# Patient Record
Sex: Female | Born: 1935 | Race: White | Hispanic: No | Marital: Married | Smoking: Never smoker
Health system: Southern US, Community
[De-identification: ages and names within clinical notes are randomized; demographics above are authoritative.]

## PROBLEM LIST (undated history)

## (undated) DIAGNOSIS — M1990 Unspecified osteoarthritis, unspecified site: Secondary | ICD-10-CM

## (undated) HISTORY — PX: CARPAL TUNNEL RELEASE: SHX101

## (undated) HISTORY — PX: REPLACEMENT TOTAL KNEE: SUR1224

---

## 1998-03-09 ENCOUNTER — Ambulatory Visit (HOSPITAL_COMMUNITY): Admission: RE | Admit: 1998-03-09 | Discharge: 1998-03-09 | Payer: Self-pay | Admitting: Obstetrics and Gynecology

## 1998-12-23 ENCOUNTER — Ambulatory Visit (HOSPITAL_COMMUNITY): Admission: RE | Admit: 1998-12-23 | Discharge: 1998-12-23 | Payer: Self-pay | Admitting: Internal Medicine

## 1998-12-24 ENCOUNTER — Encounter: Payer: Self-pay | Admitting: Internal Medicine

## 1999-02-08 ENCOUNTER — Encounter: Payer: Self-pay | Admitting: Internal Medicine

## 1999-02-08 ENCOUNTER — Ambulatory Visit (HOSPITAL_COMMUNITY): Admission: RE | Admit: 1999-02-08 | Discharge: 1999-02-08 | Payer: Self-pay | Admitting: Internal Medicine

## 1999-03-28 ENCOUNTER — Encounter: Payer: Self-pay | Admitting: Internal Medicine

## 1999-03-28 ENCOUNTER — Ambulatory Visit (HOSPITAL_COMMUNITY): Admission: RE | Admit: 1999-03-28 | Discharge: 1999-03-28 | Payer: Self-pay | Admitting: Internal Medicine

## 1999-04-06 ENCOUNTER — Ambulatory Visit (HOSPITAL_COMMUNITY): Admission: RE | Admit: 1999-04-06 | Discharge: 1999-04-06 | Payer: Self-pay | Admitting: Gastroenterology

## 1999-05-15 ENCOUNTER — Other Ambulatory Visit: Admission: RE | Admit: 1999-05-15 | Discharge: 1999-05-15 | Payer: Self-pay | Admitting: Obstetrics and Gynecology

## 1999-05-31 ENCOUNTER — Encounter: Payer: Self-pay | Admitting: Obstetrics and Gynecology

## 1999-05-31 ENCOUNTER — Ambulatory Visit (HOSPITAL_COMMUNITY): Admission: RE | Admit: 1999-05-31 | Discharge: 1999-05-31 | Payer: Self-pay | Admitting: Obstetrics and Gynecology

## 1999-07-20 ENCOUNTER — Ambulatory Visit (HOSPITAL_COMMUNITY): Admission: RE | Admit: 1999-07-20 | Discharge: 1999-07-20 | Payer: Self-pay | Admitting: Orthopaedic Surgery

## 1999-12-13 ENCOUNTER — Ambulatory Visit (HOSPITAL_COMMUNITY): Admission: RE | Admit: 1999-12-13 | Discharge: 1999-12-13 | Payer: Self-pay | Admitting: Gastroenterology

## 1999-12-21 ENCOUNTER — Ambulatory Visit (HOSPITAL_COMMUNITY): Admission: RE | Admit: 1999-12-21 | Discharge: 1999-12-21 | Payer: Self-pay | Admitting: Gastroenterology

## 1999-12-21 ENCOUNTER — Encounter: Payer: Self-pay | Admitting: Gastroenterology

## 2000-01-15 ENCOUNTER — Encounter: Payer: Self-pay | Admitting: Gastroenterology

## 2000-01-15 ENCOUNTER — Ambulatory Visit (HOSPITAL_COMMUNITY): Admission: RE | Admit: 2000-01-15 | Discharge: 2000-01-15 | Payer: Self-pay | Admitting: Gastroenterology

## 2000-02-20 ENCOUNTER — Encounter: Payer: Self-pay | Admitting: Gastroenterology

## 2000-02-20 ENCOUNTER — Ambulatory Visit (HOSPITAL_COMMUNITY): Admission: RE | Admit: 2000-02-20 | Discharge: 2000-02-20 | Payer: Self-pay | Admitting: Gastroenterology

## 2000-03-28 ENCOUNTER — Ambulatory Visit (HOSPITAL_COMMUNITY): Admission: RE | Admit: 2000-03-28 | Discharge: 2000-03-28 | Payer: Self-pay | Admitting: Internal Medicine

## 2000-03-28 ENCOUNTER — Encounter: Payer: Self-pay | Admitting: Internal Medicine

## 2000-04-09 ENCOUNTER — Encounter: Payer: Self-pay | Admitting: Otolaryngology

## 2000-04-09 ENCOUNTER — Ambulatory Visit (HOSPITAL_COMMUNITY): Admission: RE | Admit: 2000-04-09 | Discharge: 2000-04-09 | Payer: Self-pay | Admitting: Otolaryngology

## 2000-05-27 ENCOUNTER — Inpatient Hospital Stay (HOSPITAL_COMMUNITY): Admission: RE | Admit: 2000-05-27 | Discharge: 2000-05-28 | Payer: Self-pay | Admitting: Orthopaedic Surgery

## 2000-06-15 ENCOUNTER — Emergency Department (HOSPITAL_COMMUNITY): Admission: EM | Admit: 2000-06-15 | Discharge: 2000-06-15 | Payer: Self-pay | Admitting: Emergency Medicine

## 2001-03-21 ENCOUNTER — Other Ambulatory Visit: Admission: RE | Admit: 2001-03-21 | Discharge: 2001-03-21 | Payer: Self-pay | Admitting: Obstetrics and Gynecology

## 2001-03-31 ENCOUNTER — Ambulatory Visit (HOSPITAL_COMMUNITY): Admission: RE | Admit: 2001-03-31 | Discharge: 2001-03-31 | Payer: Self-pay | Admitting: Obstetrics and Gynecology

## 2001-03-31 ENCOUNTER — Encounter: Payer: Self-pay | Admitting: Internal Medicine

## 2001-05-27 ENCOUNTER — Encounter: Admission: RE | Admit: 2001-05-27 | Discharge: 2001-05-27 | Payer: Self-pay | Admitting: Orthopaedic Surgery

## 2001-05-28 ENCOUNTER — Ambulatory Visit (HOSPITAL_BASED_OUTPATIENT_CLINIC_OR_DEPARTMENT_OTHER): Admission: RE | Admit: 2001-05-28 | Discharge: 2001-05-29 | Payer: Self-pay | Admitting: Orthopaedic Surgery

## 2001-06-24 ENCOUNTER — Encounter: Admission: RE | Admit: 2001-06-24 | Discharge: 2001-09-22 | Payer: Self-pay | Admitting: Orthopaedic Surgery

## 2001-08-01 ENCOUNTER — Ambulatory Visit (HOSPITAL_BASED_OUTPATIENT_CLINIC_OR_DEPARTMENT_OTHER): Admission: RE | Admit: 2001-08-01 | Discharge: 2001-08-02 | Payer: Self-pay | Admitting: Orthopaedic Surgery

## 2002-01-22 ENCOUNTER — Encounter: Admission: RE | Admit: 2002-01-22 | Discharge: 2002-01-22 | Payer: Self-pay | Admitting: Orthopaedic Surgery

## 2002-02-11 ENCOUNTER — Encounter: Admission: RE | Admit: 2002-02-11 | Discharge: 2002-02-11 | Payer: Self-pay | Admitting: Orthopaedic Surgery

## 2002-04-03 ENCOUNTER — Encounter: Payer: Self-pay | Admitting: Obstetrics and Gynecology

## 2002-04-03 ENCOUNTER — Ambulatory Visit (HOSPITAL_COMMUNITY): Admission: RE | Admit: 2002-04-03 | Discharge: 2002-04-03 | Payer: Self-pay | Admitting: Obstetrics and Gynecology

## 2002-08-07 ENCOUNTER — Encounter: Admission: RE | Admit: 2002-08-07 | Discharge: 2002-08-07 | Payer: Self-pay | Admitting: Orthopaedic Surgery

## 2002-08-14 ENCOUNTER — Inpatient Hospital Stay (HOSPITAL_COMMUNITY): Admission: RE | Admit: 2002-08-14 | Discharge: 2002-08-17 | Payer: Self-pay | Admitting: Orthopaedic Surgery

## 2002-12-21 ENCOUNTER — Inpatient Hospital Stay (HOSPITAL_COMMUNITY): Admission: RE | Admit: 2002-12-21 | Discharge: 2002-12-28 | Payer: Self-pay | Admitting: Orthopaedic Surgery

## 2002-12-25 ENCOUNTER — Encounter: Payer: Self-pay | Admitting: Orthopedic Surgery

## 2002-12-28 ENCOUNTER — Inpatient Hospital Stay
Admission: RE | Admit: 2002-12-28 | Discharge: 2003-01-01 | Payer: Self-pay | Admitting: Physical Medicine & Rehabilitation

## 2003-04-22 ENCOUNTER — Ambulatory Visit (HOSPITAL_COMMUNITY): Admission: RE | Admit: 2003-04-22 | Discharge: 2003-04-22 | Payer: Self-pay | Admitting: Obstetrics and Gynecology

## 2003-04-22 ENCOUNTER — Encounter: Payer: Self-pay | Admitting: Obstetrics and Gynecology

## 2003-06-04 ENCOUNTER — Other Ambulatory Visit: Admission: RE | Admit: 2003-06-04 | Discharge: 2003-06-04 | Payer: Self-pay | Admitting: Obstetrics and Gynecology

## 2003-07-05 ENCOUNTER — Encounter: Admission: RE | Admit: 2003-07-05 | Discharge: 2003-07-05 | Payer: Self-pay | Admitting: Orthopaedic Surgery

## 2003-08-02 ENCOUNTER — Ambulatory Visit (HOSPITAL_COMMUNITY): Admission: RE | Admit: 2003-08-02 | Discharge: 2003-08-02 | Payer: Self-pay | Admitting: Orthopaedic Surgery

## 2003-11-05 ENCOUNTER — Encounter: Admission: RE | Admit: 2003-11-05 | Discharge: 2003-11-05 | Payer: Self-pay | Admitting: Gastroenterology

## 2004-01-22 ENCOUNTER — Ambulatory Visit (HOSPITAL_COMMUNITY): Admission: RE | Admit: 2004-01-22 | Discharge: 2004-01-22 | Payer: Self-pay | Admitting: Orthopaedic Surgery

## 2004-02-11 ENCOUNTER — Inpatient Hospital Stay (HOSPITAL_COMMUNITY): Admission: RE | Admit: 2004-02-11 | Discharge: 2004-02-13 | Payer: Self-pay | Admitting: Orthopaedic Surgery

## 2004-09-22 ENCOUNTER — Ambulatory Visit (HOSPITAL_COMMUNITY): Admission: RE | Admit: 2004-09-22 | Discharge: 2004-09-22 | Payer: Self-pay | Admitting: Gastroenterology

## 2004-12-22 ENCOUNTER — Inpatient Hospital Stay (HOSPITAL_COMMUNITY): Admission: RE | Admit: 2004-12-22 | Discharge: 2004-12-25 | Payer: Self-pay | Admitting: Orthopaedic Surgery

## 2005-01-04 ENCOUNTER — Encounter: Admission: RE | Admit: 2005-01-04 | Discharge: 2005-01-04 | Payer: Self-pay | Admitting: Orthopaedic Surgery

## 2005-01-08 ENCOUNTER — Inpatient Hospital Stay (HOSPITAL_COMMUNITY): Admission: RE | Admit: 2005-01-08 | Discharge: 2005-01-11 | Payer: Self-pay | Admitting: Orthopaedic Surgery

## 2005-07-02 ENCOUNTER — Encounter: Admission: RE | Admit: 2005-07-02 | Discharge: 2005-07-02 | Payer: Self-pay | Admitting: Orthopaedic Surgery

## 2005-12-04 ENCOUNTER — Encounter: Admission: RE | Admit: 2005-12-04 | Discharge: 2005-12-04 | Payer: Self-pay | Admitting: Orthopaedic Surgery

## 2006-09-03 ENCOUNTER — Encounter: Admission: RE | Admit: 2006-09-03 | Discharge: 2006-09-03 | Payer: Self-pay | Admitting: Orthopedic Surgery

## 2006-09-05 ENCOUNTER — Encounter (INDEPENDENT_AMBULATORY_CARE_PROVIDER_SITE_OTHER): Payer: Self-pay | Admitting: *Deleted

## 2006-09-05 ENCOUNTER — Ambulatory Visit (HOSPITAL_BASED_OUTPATIENT_CLINIC_OR_DEPARTMENT_OTHER): Admission: RE | Admit: 2006-09-05 | Discharge: 2006-09-05 | Payer: Self-pay | Admitting: Orthopedic Surgery

## 2006-09-17 ENCOUNTER — Ambulatory Visit (HOSPITAL_BASED_OUTPATIENT_CLINIC_OR_DEPARTMENT_OTHER): Admission: RE | Admit: 2006-09-17 | Discharge: 2006-09-18 | Payer: Self-pay | Admitting: Orthopedic Surgery

## 2006-09-18 ENCOUNTER — Ambulatory Visit (HOSPITAL_COMMUNITY): Admission: RE | Admit: 2006-09-18 | Discharge: 2006-09-18 | Payer: Self-pay | Admitting: Interventional Radiology

## 2009-04-26 ENCOUNTER — Encounter: Admission: RE | Admit: 2009-04-26 | Discharge: 2009-04-26 | Payer: Self-pay | Admitting: Rheumatology

## 2009-04-28 ENCOUNTER — Encounter: Admission: RE | Admit: 2009-04-28 | Discharge: 2009-04-28 | Payer: Self-pay | Admitting: Rheumatology

## 2009-05-04 ENCOUNTER — Encounter: Admission: RE | Admit: 2009-05-04 | Discharge: 2009-05-04 | Payer: Self-pay | Admitting: Rheumatology

## 2009-05-06 ENCOUNTER — Encounter: Admission: RE | Admit: 2009-05-06 | Discharge: 2009-05-06 | Payer: Self-pay | Admitting: Rheumatology

## 2009-06-17 ENCOUNTER — Encounter (INDEPENDENT_AMBULATORY_CARE_PROVIDER_SITE_OTHER): Payer: Self-pay | Admitting: General Surgery

## 2009-06-17 ENCOUNTER — Ambulatory Visit (HOSPITAL_BASED_OUTPATIENT_CLINIC_OR_DEPARTMENT_OTHER): Admission: RE | Admit: 2009-06-17 | Discharge: 2009-06-17 | Payer: Self-pay | Admitting: General Surgery

## 2009-07-01 ENCOUNTER — Ambulatory Visit: Payer: Self-pay | Admitting: Oncology

## 2009-07-04 ENCOUNTER — Ambulatory Visit: Admission: RE | Admit: 2009-07-04 | Discharge: 2009-09-03 | Payer: Self-pay | Admitting: Radiation Oncology

## 2009-08-09 ENCOUNTER — Ambulatory Visit: Payer: Self-pay | Admitting: Oncology

## 2009-08-11 LAB — CBC WITH DIFFERENTIAL/PLATELET
Basophils Absolute: 0 10*3/uL (ref 0.0–0.1)
EOS%: 2.1 % (ref 0.0–7.0)
Eosinophils Absolute: 0.1 10*3/uL (ref 0.0–0.5)
HGB: 11.8 g/dL (ref 11.6–15.9)
LYMPH%: 13.8 % — ABNORMAL LOW (ref 14.0–49.7)
MCH: 31.6 pg (ref 25.1–34.0)
MCV: 93.8 fL (ref 79.5–101.0)
MONO%: 7 % (ref 0.0–14.0)
NEUT#: 4.5 10*3/uL (ref 1.5–6.5)
NEUT%: 76.5 % (ref 38.4–76.8)
Platelets: 199 10*3/uL (ref 145–400)
RDW: 13.7 % (ref 11.2–14.5)

## 2009-08-11 LAB — COMPREHENSIVE METABOLIC PANEL
AST: 28 U/L (ref 0–37)
Albumin: 4.4 g/dL (ref 3.5–5.2)
Alkaline Phosphatase: 76 U/L (ref 39–117)
BUN: 13 mg/dL (ref 6–23)
Creatinine, Ser: 0.89 mg/dL (ref 0.40–1.20)
Glucose, Bld: 115 mg/dL — ABNORMAL HIGH (ref 70–99)
Total Bilirubin: 0.8 mg/dL (ref 0.3–1.2)

## 2009-08-12 LAB — VITAMIN D 25 HYDROXY (VIT D DEFICIENCY, FRACTURES): Vit D, 25-Hydroxy: 24 ng/mL — ABNORMAL LOW (ref 30–89)

## 2009-09-01 ENCOUNTER — Encounter: Admission: RE | Admit: 2009-09-01 | Discharge: 2009-09-01 | Payer: Self-pay | Admitting: Oncology

## 2009-09-08 ENCOUNTER — Ambulatory Visit: Payer: Self-pay | Admitting: Oncology

## 2009-09-20 LAB — CBC WITH DIFFERENTIAL/PLATELET
BASO%: 0.3 % (ref 0.0–2.0)
EOS%: 4.1 % (ref 0.0–7.0)
HCT: 33.7 % — ABNORMAL LOW (ref 34.8–46.6)
HGB: 11.2 g/dL — ABNORMAL LOW (ref 11.6–15.9)
MCH: 31.7 pg (ref 25.1–34.0)
MCHC: 33.2 g/dL (ref 31.5–36.0)
MCV: 95.6 fL (ref 79.5–101.0)
MONO%: 6.8 % (ref 0.0–14.0)
NEUT#: 4.6 10*3/uL (ref 1.5–6.5)
NEUT%: 77.9 % — ABNORMAL HIGH (ref 38.4–76.8)
Platelets: 181 10*3/uL (ref 145–400)
RDW: 13.9 % (ref 11.2–14.5)
WBC: 5.9 10*3/uL (ref 3.9–10.3)
lymph#: 0.6 10*3/uL — ABNORMAL LOW (ref 0.9–3.3)

## 2009-09-21 LAB — COMPREHENSIVE METABOLIC PANEL
AST: 18 U/L (ref 0–37)
Albumin: 4.4 g/dL (ref 3.5–5.2)
Alkaline Phosphatase: 75 U/L (ref 39–117)
BUN: 16 mg/dL (ref 6–23)
Glucose, Bld: 98 mg/dL (ref 70–99)
Potassium: 4.6 mEq/L (ref 3.5–5.3)
Sodium: 140 mEq/L (ref 135–145)
Total Bilirubin: 0.4 mg/dL (ref 0.3–1.2)

## 2010-03-08 ENCOUNTER — Ambulatory Visit: Payer: Self-pay | Admitting: Oncology

## 2010-03-09 LAB — CBC WITH DIFFERENTIAL/PLATELET
BASO%: 0.5 % (ref 0.0–2.0)
Eosinophils Absolute: 0.2 10*3/uL (ref 0.0–0.5)
LYMPH%: 17.7 % (ref 14.0–49.7)
MONO#: 0.4 10*3/uL (ref 0.1–0.9)
NEUT#: 3.5 10*3/uL (ref 1.5–6.5)
Platelets: 198 10*3/uL (ref 145–400)
RBC: 3.62 10*6/uL — ABNORMAL LOW (ref 3.70–5.45)
WBC: 5.1 10*3/uL (ref 3.9–10.3)
lymph#: 0.9 10*3/uL (ref 0.9–3.3)

## 2010-03-10 LAB — COMPREHENSIVE METABOLIC PANEL
ALT: 11 U/L (ref 0–35)
Albumin: 4.4 g/dL (ref 3.5–5.2)
CO2: 24 mEq/L (ref 19–32)
Calcium: 9.3 mg/dL (ref 8.4–10.5)
Chloride: 103 mEq/L (ref 96–112)
Glucose, Bld: 123 mg/dL — ABNORMAL HIGH (ref 70–99)
Potassium: 4.6 mEq/L (ref 3.5–5.3)
Sodium: 139 mEq/L (ref 135–145)
Total Protein: 6.6 g/dL (ref 6.0–8.3)

## 2010-03-10 LAB — CANCER ANTIGEN 27.29: CA 27.29: 30 U/mL (ref 0–39)

## 2010-03-10 LAB — LACTATE DEHYDROGENASE: LDH: 188 U/L (ref 94–250)

## 2010-04-06 ENCOUNTER — Encounter: Admission: RE | Admit: 2010-04-06 | Discharge: 2010-04-06 | Payer: Self-pay | Admitting: Anesthesiology

## 2010-05-05 ENCOUNTER — Encounter: Admission: RE | Admit: 2010-05-05 | Discharge: 2010-05-05 | Payer: Self-pay | Admitting: Oncology

## 2010-08-30 ENCOUNTER — Ambulatory Visit: Payer: Self-pay | Admitting: Oncology

## 2010-09-01 LAB — CBC WITH DIFFERENTIAL/PLATELET
BASO%: 0.5 % (ref 0.0–2.0)
Basophils Absolute: 0 10*3/uL (ref 0.0–0.1)
EOS%: 4.6 % (ref 0.0–7.0)
HCT: 34.3 % — ABNORMAL LOW (ref 34.8–46.6)
HGB: 11.5 g/dL — ABNORMAL LOW (ref 11.6–15.9)
MCH: 31.7 pg (ref 25.1–34.0)
MCHC: 33.5 g/dL (ref 31.5–36.0)
MONO#: 0.4 10*3/uL (ref 0.1–0.9)
NEUT%: 77 % — ABNORMAL HIGH (ref 38.4–76.8)
RDW: 14.4 % (ref 11.2–14.5)
WBC: 5.3 10*3/uL (ref 3.9–10.3)
lymph#: 0.6 10*3/uL — ABNORMAL LOW (ref 0.9–3.3)

## 2010-09-02 LAB — COMPREHENSIVE METABOLIC PANEL
AST: 20 U/L (ref 0–37)
Albumin: 4.3 g/dL (ref 3.5–5.2)
Alkaline Phosphatase: 94 U/L (ref 39–117)
BUN: 14 mg/dL (ref 6–23)
Creatinine, Ser: 0.98 mg/dL (ref 0.40–1.20)
Glucose, Bld: 125 mg/dL — ABNORMAL HIGH (ref 70–99)

## 2010-09-02 LAB — VITAMIN D 25 HYDROXY (VIT D DEFICIENCY, FRACTURES): Vit D, 25-Hydroxy: 34 ng/mL (ref 30–89)

## 2010-09-02 LAB — CANCER ANTIGEN 27.29: CA 27.29: 30 U/mL (ref 0–39)

## 2010-10-28 ENCOUNTER — Inpatient Hospital Stay (HOSPITAL_COMMUNITY)
Admission: EM | Admit: 2010-10-28 | Discharge: 2010-11-02 | Payer: Self-pay | Attending: Endocrinology | Admitting: Endocrinology

## 2010-11-06 LAB — COMPREHENSIVE METABOLIC PANEL
ALT: 14 U/L (ref 0–35)
ALT: 12 U/L (ref 0–35)
ALT: 12 U/L (ref 0–35)
AST: 24 U/L (ref 0–37)
AST: 18 U/L (ref 0–37)
AST: 18 U/L (ref 0–37)
Albumin: 3.8 g/dL (ref 3.5–5.2)
Albumin: 3.4 g/dL — ABNORMAL LOW (ref 3.5–5.2)
Albumin: 3 g/dL — ABNORMAL LOW (ref 3.5–5.2)
Alkaline Phosphatase: 86 U/L (ref 39–117)
Alkaline Phosphatase: 72 U/L (ref 39–117)
Alkaline Phosphatase: 74 U/L (ref 39–117)
BUN: 10 mg/dL (ref 6–23)
BUN: 9 mg/dL (ref 6–23)
BUN: 6 mg/dL (ref 6–23)
CO2: 32 mEq/L (ref 19–32)
CO2: 33 mEq/L — ABNORMAL HIGH (ref 19–32)
CO2: 32 mEq/L (ref 19–32)
Calcium: 8.8 mg/dL (ref 8.4–10.5)
Calcium: 8.7 mg/dL (ref 8.4–10.5)
Calcium: 8.7 mg/dL (ref 8.4–10.5)
Chloride: 97 mEq/L (ref 96–112)
Chloride: 103 mEq/L (ref 96–112)
Chloride: 100 mEq/L (ref 96–112)
Creatinine, Ser: 0.87 mg/dL (ref 0.4–1.2)
Creatinine, Ser: 0.95 mg/dL (ref 0.4–1.2)
Creatinine, Ser: 0.99 mg/dL (ref 0.4–1.2)
GFR calc Af Amer: >60 mL/min (ref >60)
GFR calc Af Amer: >60 mL/min (ref >60)
GFR calc Af Amer: >60 mL/min (ref >60)
GFR calc non Af Amer: >60 mL/min (ref >60)
GFR calc non Af Amer: 55 mL/min — ABNORMAL LOW (ref >60)
GFR calc non Af Amer: 53 mL/min — ABNORMAL LOW (ref >60)
Glucose, Bld: 95 mg/dL (ref 70–99)
Glucose, Bld: 99 mg/dL (ref 70–99)
Glucose, Bld: 107 mg/dL — ABNORMAL HIGH (ref 70–99)
Potassium: 3.9 mEq/L (ref 3.5–5.1)
Potassium: 4.7 mEq/L (ref 3.5–5.1)
Potassium: 4.2 mEq/L (ref 3.5–5.1)
Sodium: 135 mEq/L (ref 135–145)
Sodium: 141 mEq/L (ref 135–145)
Sodium: 140 mEq/L (ref 135–145)
Total Bilirubin: 0.7 mg/dL (ref 0.3–1.2)
Total Bilirubin: 0.8 mg/dL (ref 0.3–1.2)
Total Bilirubin: 0.7 mg/dL (ref 0.3–1.2)
Total Protein: 6.3 g/dL (ref 6.0–8.3)
Total Protein: 5.6 g/dL — ABNORMAL LOW (ref 6.0–8.3)
Total Protein: 5.6 g/dL — ABNORMAL LOW (ref 6.0–8.3)

## 2010-11-06 LAB — CULTURE, BLOOD (ROUTINE X 2)
Culture  Setup Time: 201201072032
Culture  Setup Time: 201201072033
Culture: NO GROWTH
Culture: NO GROWTH

## 2010-11-06 LAB — CBC
HCT: 37.7 % (ref 36.0–46.0)
HCT: 35.5 % — ABNORMAL LOW (ref 36.0–46.0)
HCT: 35.6 % — ABNORMAL LOW (ref 36.0–46.0)
Hemoglobin: 11.7 g/dL — ABNORMAL LOW (ref 12.0–15.0)
Hemoglobin: 11.3 g/dL — ABNORMAL LOW (ref 12.0–15.0)
Hemoglobin: 10.8 g/dL — ABNORMAL LOW (ref 12.0–15.0)
MCH: 29.5 pg (ref 26.0–34.0)
MCH: 30.5 pg (ref 26.0–34.0)
MCH: 29.7 pg (ref 26.0–34.0)
MCHC: 31 g/dL (ref 30.0–36.0)
MCHC: 31.8 g/dL (ref 30.0–36.0)
MCHC: 30.3 g/dL (ref 30.0–36.0)
MCV: 95 fL (ref 78.0–100.0)
MCV: 95.7 fL (ref 78.0–100.0)
MCV: 97.8 fL (ref 78.0–100.0)
Platelets: 185 10*3/uL (ref 150–400)
Platelets: 168 10*3/uL (ref 150–400)
Platelets: 163 10*3/uL (ref 150–400)
RBC: 3.97 MIL/uL (ref 3.87–5.11)
RBC: 3.71 MIL/uL — ABNORMAL LOW (ref 3.87–5.11)
RBC: 3.64 MIL/uL — ABNORMAL LOW (ref 3.87–5.11)
RDW: 13.5 % (ref 11.5–15.5)
RDW: 13.8 % (ref 11.5–15.5)
RDW: 14.2 % (ref 11.5–15.5)
WBC: 8.5 10*3/uL (ref 4.0–10.5)
WBC: 6 10*3/uL (ref 4.0–10.5)
WBC: 7.1 10*3/uL (ref 4.0–10.5)

## 2010-11-06 LAB — TSH: TSH: 1.359 u[IU]/mL (ref 0.350–4.500)

## 2010-11-06 LAB — BRAIN NATRIURETIC PEPTIDE: Pro B Natriuretic peptide (BNP): 30 pg/mL (ref 0.0–100.0)

## 2010-11-09 ENCOUNTER — Encounter
Admission: RE | Admit: 2010-11-09 | Discharge: 2010-11-09 | Payer: Self-pay | Source: Home / Self Care | Attending: Endocrinology | Admitting: Endocrinology

## 2010-11-11 ENCOUNTER — Other Ambulatory Visit: Payer: Self-pay | Admitting: Oncology

## 2010-11-11 DIAGNOSIS — Z9889 Other specified postprocedural states: Secondary | ICD-10-CM

## 2010-12-06 NOTE — Discharge Summary (Signed)
[name], [name]               ACCOUNT NO.:  [id number]  MEDICAL RECORD NO.:  [id number]          PATIENT TYPE:  INP  LOCATION:  5021                         FACILITY:  [facility]  PHYSICIAN:  Larina Earthly, M.D.        DATE OF BIRTH:  [birth year]  DATE OF ADMISSION:  10/28/2010 DATE OF DISCHARGE:  11/02/2010                              DISCHARGE SUMMARY   DISCHARGE DIAGNOSES: 1. Community-acquired pneumonia, right middle lobe. 2. Physical deconditioning and generalized weakness as a result of     community-acquired pneumonia. 3. Hypertension. 4. Hypothyroidism.  SECONDARY DIAGNOSES: 1. Gastroesophageal reflux disease associated with dysphagia. 2. Irritable bowel syndrome. 3. Osteoarthritis associated with history of rheumatoid arthritis and     fibromyalgia. 4. Remote breast cancer in 2010 status post radiation therapy. 5. History of shingles. 6. History of pleural effusion in the past. 7. History of Meniere's disease. 8. History of prior hepatitis. 9. History of normal Cardiolite in the past. 10.History of multiple joint surgeries.  DISCHARGE MEDICATIONS: 1. Benicar 20 mg p.o. daily. 2. Detrol 4 mg p.o. daily. 3. Hormone replacement therapy as needed for hot flashes. 4. Gabapentin 100 mg p.o. t.i.d. 5. Synthroid 88 mcg each day. 6. Hyoscyamine p.o. twice daily. 7. Meclizine 25 mg q.6 h. p.r.n. 8. Flexeril 10 mg q.8 h. p.r.n. 9. Prevacid 60 mg daily. 10.Fentanyl patch 50 mcg every 2 days. 11.Oxycodone 10 mg t.i.d. p.r.n. 12.Restasis eye drops. 13.Avelox 400 mg p.o. daily x7 additional days. 14.Albuterol nebs q.4 h. p.r.n. 15.Oxygen to keep oxygen saturation greater than 90%.  DISPOSITION:  The patient is currently pending home health for physical therapy for significant physical deconditioning and respiratory therapy for nebs and oxygen versus possible skilled nursing facility if family cannot provide enough help in assistance at home on a 24-hour basis. The patient  will be scheduled for an office visit in 2-3 weeks at which time she will get a BMET, CBC, chest x-ray, and reassessment of her oxygen status.  DISCHARGE LABS:  White blood cell count 7.1, hemoglobin 10.8, hematocrit 35.6%, platelet count 163,000.  Sodium 140, potassium 4.2, BUN 6, creatinine 0.99, glucose normal, AST 18, ALT 12, alkaline phosphatase 74, total bilirubin 0.7, albumin 3.0, calcium 8.7.  Chest x-ray on October 31, 2010 did reveal questionable early right middle lobe infiltrate with initial chest x-ray on October 29, 2010 revealing no apparent acute airspace disease.  Blood cultures x2 on admission negative.  BNP 30.  TSH 1.358.  HISTORY OF PRESENT ILLNESS:  Please see history and physical dictated by Dr. Evlyn Kanner on October 28, 2010, however, we have a 77 year old female with multiple medical problems apparently presenting with community- acquired pneumonia with initial radiology assessment of chest x-ray revealing no apparent or acute airspace disease.  However, there are multiple calcifications on the ribs which could have been confused for pneumonia by emergency room personnel.  She did present with chills, sweats, but was afebrile and she was covered with broad-spectrum antibiotics consisting of Avelox as well as supplemental oxygen and some albuterol treatments and gentle hydration.  Blood pressure was initially quite elevated but did come down.  Cultures were ordered and she was a full code status and she was admitted for further evaluation and management.  Over the next 2-3 days, she did improve clinically, albeit was still physically deconditioned and generally weak.  Given the fact that she was no longer febrile, her white blood cell count was normal. Her pulmonary situation was stable.  She was hemodynamically stable and she was thought appropriate for discharge to either home versus skilled nursing facility.  Given her physical deconditioning, physical therapy was  sent in to evaluate the patient and noted that her prior functional status was independent but she did not own a tub and shower seat, rolling walker, and wheelchair but also noted that she was physically weak and that the husband did work at least part-time and she was only moderately independent with a rail, did require minimal assistance with sitting to standing and fatigues quite quickly and has deficits and pain, strength, imbalance, and was generally deconditioned and recommended significant assistance at home versus skilled nursing facility.  This was discussed at length with the patient as well as the husband on November 01, 2010 and they were still somewhat reserved with respect to choosing between one of these two options.  They decided that they would see if they could facilitate more extensive assistance at home and deferred discharge to November 02, 2010, however, if they could not arrange this, we may seek out skilled nursing facility placement for the short-term.     Larina Earthly, M.D.     RA/MEDQ  D:  11/01/2010  T:  11/01/2010  Job:  [id number]  Electronically Signed by Larina Earthly M.D. on 12/06/2010 09:56:53 PM

## 2011-01-27 LAB — DIFFERENTIAL
Basophils Relative: 0 % (ref 0–1)
Eosinophils Relative: 2 % (ref 0–5)
Lymphocytes Relative: 13 % (ref 12–46)
Monocytes Absolute: 0.5 10*3/uL (ref 0.1–1.0)
Monocytes Relative: 7 % (ref 3–12)
Neutro Abs: 5.5 10*3/uL (ref 1.7–7.7)

## 2011-01-27 LAB — COMPREHENSIVE METABOLIC PANEL
AST: 24 U/L (ref 0–37)
Albumin: 4.3 g/dL (ref 3.5–5.2)
Alkaline Phosphatase: 92 U/L (ref 39–117)
BUN: 14 mg/dL (ref 6–23)
Chloride: 102 mEq/L (ref 96–112)
GFR calc Af Amer: >60 mL/min (ref >60)
Potassium: 4.3 mEq/L (ref 3.5–5.1)
Total Bilirubin: 0.8 mg/dL (ref 0.3–1.2)
Total Protein: 7.1 g/dL (ref 6.0–8.3)

## 2011-01-27 LAB — CBC
MCV: 93.3 fL (ref 78.0–100.0)
Platelets: 220 10*3/uL (ref 150–400)
RBC: 3.8 MIL/uL — ABNORMAL LOW (ref 3.87–5.11)
WBC: 7 10*3/uL (ref 4.0–10.5)

## 2011-01-27 LAB — CANCER ANTIGEN 27.29: CA 27.29: 35 U/mL (ref 0–39)

## 2011-01-27 LAB — CEA: CEA: 2.7 ng/mL (ref 0.0–5.0)

## 2011-03-06 NOTE — Op Note (Signed)
NAMEEMMELINE, WINEBARGER               ACCOUNT NO.:  [id number]   MEDICAL RECORD NO.:  [id number]          PATIENT TYPE:  AMB   LOCATION:  DSC                          FACILITY:  [facility]   PHYSICIAN:  Juanetta Gosling, MDDATE OF BIRTH:  02-01-1918   DATE OF PROCEDURE:  06/17/2009  DATE OF DISCHARGE:                               OPERATIVE REPORT   PREOPERATIVE DIAGNOSIS:  Right breast cancer, it appears to be clinical  stage I.   POSTOPERATIVE DIAGNOSIS:  Right breast cancer, it appears to be clinical  stage I.   PROCEDURE:  Right breast lumpectomy.   SURGEON:  Juanetta Gosling, MD   ASSISTANT:  None.   ANESTHESIA:  Local MAC.   SUPERVISING ANESTHESIOLOGIST:  Sheldon Silvan, MD   SPECIMENS:  Right breast tissue to pathology oriented with short stitch  superior, long stitch lateral, double stitch deep.   ESTIMATED BLOOD LOSS:  Minimal.   COMPLICATIONS:  None.   DRAINS:  None.   DISPOSITION:  To recovery room in stable condition.   INDICATIONS:  Ms. Shultis is an 77 year old female who had a recent  mammogram showing an abnormality in the right breast measuring 10 mm x  10 mm x 6 mm.  She was brought back for spot compression and an  ultrasound that showed spiculated solid mass in the location of the  palpable nodule 6 cm on the right nipple.  Sonography of her right  axilla demonstrated no abnormal lymph nodes.  She underwent ultrasound-  guided biopsy that showed invasive ductal carcinoma ER/PR positive, HER2  negative that appeared low grade.  We discussed performing a right  lumpectomy as treatment for her breast cancer and possible adjuvant  therapy postoperatively.  She was discussed in conference and everyone  agreed that there was not a necessity to take out the sentinel lymph  node with her to do a lumpectomy and then plan on any further adjuvant  therapy.   PROCEDURE:  After informed consent was obtained, the patient was taken  to the operating room.  She was  placed under monitored anesthesia care.  First an ultrasound near the palpable mass located the mass as well as  the clip that had been left previously.  Following this, she was prepped  with ChloraPrep.  Three minutes were allowed to pass.  She was then  draped in a standard sterile surgical fashion.  Surgical time-out was  performed.   An elliptical incision then was then made overlying the mass with  electrocautery.  I used a total of about 30 mL of 0.25% Marcaine without  epinephrine during the procedure.  Electrocautery and sharp dissection  was used to remove the mass as well as an area of normal tissue around  this.  This was taken down to the pectoralis muscle inferiorly.  This  went a little bit below the inframammary crease and was right on the  skin the entire time.  This area was removed in total and passed off the  table.  This was first oriented appropriately.  I then obtain hemostasis  in the cavity.  The  dermis was closed with interrupted of 3-0 Vicryl  sutures.  The skin was closed with 4-0 Monocryl in a subcu fashion.  Steri-Strips and sterile dressing were placed over this.  She tolerated  this well and was transferred to the recovery room in stable condition.      Juanetta Gosling, MD  Electronically Signed     MCW/MEDQ  D:  06/17/2009  T:  06/17/2009  Job:  [id number]   cc:   Larina Earthly, M.D.

## 2011-03-09 NOTE — Op Note (Signed)
NAMEAVA, TANGNEY               ACCOUNT NO.:  [id number]   MEDICAL RECORD NO.:  [id number]          PATIENT TYPE:  AMB   LOCATION:  DSC                          FACILITY:  [facility]   PHYSICIAN:  Cindee Salt, M.D.       DATE OF BIRTH:  10-25-1917   DATE OF PROCEDURE:  09/17/2006  DATE OF DISCHARGE:                                 OPERATIVE REPORT   PREOPERATIVE DIAGNOSIS:  Status post tenosynovectomy flexors left wrist with  questionable infection.   POSTOPERATIVE DIAGNOSIS:  Status post tenosynovectomy flexors left wrist  with questionable infection.   OPERATION:  Incision and drainage, packing of tenosynovectomy site left  forearm/hand.   SURGEON:  Cindee Salt, M.D.   ASSISTANT:  Carolyne Fiscal R.N.   ANESTHESIA:  General.   HISTORY:  The patient is an 77 year old female with a history of arthritis.  She has undergone tenosynovectomy carpal tunnel release of her left hand  approximately 10 days ago and has had pain, swelling and erythema despite  being on doxycycline.  She is admitted now for incision and drainage.   PROCEDURE:  The patient was brought to the operating room where a general  anesthetic was carried out without difficulty.  She was prepped using  DuraPrep, supine position, left arm free.  The area of swelling, erythema,  incision was made in the proximal wound which has not entirely healed,  carried down through subcutaneous tissue.  A large seromatous collection of  fluid was immediately encountered.  Cultures were taken for both aerobic and  anaerobic cultures.  The wound was extended distally opening the entire  area.  No gross purulence was noted.  No significant loss of tissue was  noted, as such no debridement was performed.  The wound was then copiously  irrigated with saline clearing all areas and this was then packed open with  plain gauze.  A sterile compressive dressing and splint was applied.  The  patient tolerated the procedure well was taken to the recovery  room for  observation in satisfactory condition.  She will be admitted for overnight  stay for vancomycin. She will be discharged to home on doxycycline, possible  vancomycin as a home treatment.           ______________________________  Cindee Salt, M.D.     GK/MEDQ  D:  09/17/2006  T:  09/17/2006  Job:  [id number]

## 2011-03-09 NOTE — Discharge Summary (Signed)
[name], [name]               ACCOUNT NO.:  [id number]   MEDICAL RECORD NO.:  [id number]          PATIENT TYPE:  INP   LOCATION:  5021                         FACILITY:  [facility]   PHYSICIAN:  Mark C. Ophelia Charter, M.D.    DATE OF BIRTH:  1918-03-18   DATE OF ADMISSION:  01/08/2005  DATE OF DISCHARGE:  01/11/2005                                 DISCHARGE SUMMARY   FINAL DIAGNOSIS:  Left total shoulder arthroplasty, humeral component  instability in the immediate postoperative period.   ADDITIONAL DIAGNOSES:  1.  Rheumatoid arthritis.  2.  Osteoporosis.   This 77 year old female with longstanding rheumatoid arthritis has had  multiple joint procedures.  Had a left total shoulder arthroplasty done and  at home had been doing well until she was trying to push herself up with her  left elbow, putting all her weight on her left elbow and tried to rotate it.  She stood up.  The component had been in place for 10 days, when she felt  sharp pain and was seen in the hospital, with x-rays demonstrating that the  component had rotated, leading to its malposition.  It had rotated about 90  degrees.  CT scan was obtained, which showed the malrotation.  She was  admitted at this time for reexploration and then conversion from a press fit  to a cemented humeral stem.  The component looked good.  On January 08, 2005,  she underwent the revision, humeral component revision, with Dr. Doneen Poisson assisting, under general anesthesia.  Component was cemented in,  with good stability.  Hemovac was pulled postop day one.  She made slow  progress due to pain problems that she has with rheumatoid arthritis.  She  was taking Mepergan Forte for pain.  Home R.N., home physical therapy, bath  aide were all arranged, and she was ready for discharge on January 11, 2005.  Hemoglobin preoperatively was 11.3.  There was minimal blood loss.  She was  discharged and asked to follow up in one week.  She was using her  sling and  instructed not to lean on her elbow or apply stress for risk of fracture due  to osteoporosis for a period of six to eight weeks.  X-rays postop showed  excellent position, good cement mantle.       MCY/MEDQ  D:  04/02/2005  T:  04/02/2005  Job:  [id number]

## 2011-03-09 NOTE — Op Note (Signed)
NAME:  [name], [name]                         ACCOUNT NO.:  [id number]   MEDICAL RECORD NO.:  [id number]                   PATIENT TYPE:  INP   LOCATION:  5025                                 FACILITY:  [facility]   PHYSICIAN:  Mark C. Ophelia Charter, M.D.                 DATE OF BIRTH:  [birth year]   DATE OF PROCEDURE:  08/19/2002  DATE OF DISCHARGE:  08/17/2002                                 OPERATIVE REPORT   DATE OF SURGERY:  August 14, 2002   PREOPERATIVE DIAGNOSIS:  C5-C6, C6-C7 spondylosis with rheumatoid arthritis  and degenerative spondylolisthesis.   POSTOPERATIVE DIAGNOSIS:  C5-C6, C6-C7 spondylosis with rheumatoid arthritis  and degenerative spondylolisthesis.   PROCEDURE:  C5-C6 and C6-C7 anterior cervical discectomy and fusion with  allograft and plating.   SURGEON:  Mark C. Ophelia Charter, M.D.   ASSISTANT:  Garnette Scheuermann, P.A.-C.   ANESTHESIA:  General.   ESTIMATED BLOOD LOSS:  Less than 100 cc.   DRAINS:  One Hemovac neck.   PROCEDURE:  After induction of general anesthesia and oral endotracheal  intubation, he had traction application and a sandbag behind the neck.  The  neck was prepped in the usual fashion.  Room was made so that the C-arm  could be used underneath.  The neck was prepped with DuraPrep, the area  spread with towels, a sterile Mayo stand at the hand, Betadine Vi-Drape  application after sterile skin marker overlying the C6 vertebra and thyroid  sheet and half sheets.  An incision was made at the midline extending at the  left.  The platysma was divided in line with the skin incision.  Blunt  dissection with carotid sheath and all contents lateral was performed down  to the level of the longus colli.  The esophagus and trachea were at the  midline.  The longus colli was slightly elevated and teeth retractors were  placed right and left and smooth retractors up and down self-retaining.  Discectomy was performed with the scalpel blade at C5-C6.  Laminar  spreader  was used.  The operating microscope was draped and brought in.  The  posterior longitudinal ligament was taken down and large posterior  osteophytes were removed.  The dura was well visualized and identical  procedure was repeated at C6-C7 removing the uncovertebral spurring and  posterior longitudinal ligament and large spurs and degenerative disc  material.  The dura was completely visualized and both lateral gutters were  opened up with the 2 mm Kerrison.   The Aesculap cervical plate, 43 mm, was selected.  Unicortical screws 12 mm  were selected x 4, and allograft bone was inserted after burs were used to  undercut the endplates slightly to counter sink the graft leaving some bone  posteriorly to prevent posterior migration of the allograft plug.  Once the  bone plug was inserted, it was checked with a Kocher clamp and stayed in  place.  Care was taken to make sure that the autograft was not too tall to  prevent over distraction.  With the allograft secure, the plate was placed  and four screws were placed.  C-arm did not give excellent visualization and  plane radiographs were taken which confirmed good position.  After  irrigation with saline solution, Hemovac was placed through a separate stab  incision with in and out technique in line with the skin incision.  The  platysma was closed with 3-0 Vicryl, 4-0 Vicryl subcuticular closure, and  soft cervical collar.  Instrument counts and needle counts were correct.  The patient was transported to the recovery room and was neurologically  intact.                                               Mark C. Ophelia Charter, M.D.    MCY/MEDQ  D:  09/04/2002  T:  09/04/2002  Job:  [id number]

## 2011-03-09 NOTE — Op Note (Signed)
[name], [name]               ACCOUNT NO.:  [id number]   MEDICAL RECORD NO.:  [id number]          PATIENT TYPE:  AMB   LOCATION:  DSC                          FACILITY:  [facility]   PHYSICIAN:  Cindee Salt, M.D.       DATE OF BIRTH:  16-Sep-1918   DATE OF PROCEDURE:  09/05/2006  DATE OF DISCHARGE:                                 OPERATIVE REPORT   PREOPERATIVE DIAGNOSIS:  Carpal tunnel syndrome with rheumatoid arthritis,  left wrist.   POSTOPERATIVE DIAGNOSIS:  Carpal tunnel syndrome with rheumatoid arthritis,  left wrist.   OPERATION:  Flexor tenosynovectomy carpal tunnel release, left wrist.   SURGEON:  Cindee Salt, M.D.   ASSISTANTCarolyne Fiscal R.N.   ANESTHESIA:  Forearm based IV regional.   HISTORY:  The patient is an 77 year old female with a history of rheumatoid  arthritis, severe carpal tunnel syndrome, EMG nerve conductions positive.  She is admitted now for tenosynovectomy, is well aware of risks and  complications including incomplete relief of symptoms, recurrence, injury to  arteries, nerves, tendons and dystrophy.  She is desirous of proceeding to  have this done.  She is well aware of possibility of stiffness with her  arthritis following the tenosynovectomy.  In the preoperative area the  questions were encouraged and answered.  The extremity marked by both the  patient and surgeon.   PROCEDURE:  The patient was brought to the operating room where a IV  regional anesthetic was carried out without difficulty.  She was prepped  using DuraPrep, supine position, left arm free.  A longitudinal incision was  made in midpalm, carried to the ulnar aspect onto the distal forearm,  carried down through subcutaneous tissue.  Bleeders were electrocauterized.  A significant prominence of the median nerve was present proximally due to  the amount of swelling of the tenosynovium tissue.  A release was then  performed and a proximal to distal direction through the flexor  retinaculum.  This released the entire median nerve.  An hour glass deformity was  immediately apparent to the nerve with a area of significant hyperemia in  the midportion of the hour glass.  A tenosynovectomy was then performed  using sharp and blunt dissection rongeur.  Vascular supply to the  tenosynovium tissue was cauterized as the tenosynovium was removed.  This  was removed from each one of the flexor tendons, including the profundus,  superficialis and FPL.  This was sent to pathology.  The wound was then  irrigated.  A vessel loop drain was placed in the depths of the wound.  The  skin closed with interrupted 5-0 nylon sutures.  Sterile compressive  dressing and splint leaving the fingers free was applied.  The patient  tolerated the procedure well and was taken to  the recovery for observation in satisfactory condition.  She is discharged  home to return to [facility] in one week on Darvocet N 100,  to take ibuprofen with this for discomfort.  She will return on Monday for  removal of her dressing, mobilization removal of the drain.  ______________________________  Cindee Salt, M.D.     GK/MEDQ  D:  09/05/2006  T:  09/05/2006  Job:  [id number]

## 2011-03-09 NOTE — Op Note (Signed)
[facility]  Patient:    [name], [name] Visit Number: [id number] MRN: [id number]          Service Type: Attending:  Veverly Fells. Ophelia Charter, M.D. Dictated by:   Veverly Fells Ophelia Charter, M.D. Proc. Date: 08/01/01                             Operative Report  PREOPERATIVE DIAGNOSIS:  Left shoulder impingement with complete rotator cuff tear.  POSTOPERATIVE DIAGNOSIS:  Left shoulder impingement with complete rotator cuff tear.  PROCEDURES:  Left shoulder arthroscopy, debridement of labral tear and biceps tendon stump, open acromioplasty and rotator cuff repair.  SURGEON:  Mark C. Ophelia Charter, M.D.  ASSISTANT:  Zonia Kief, P.A.-C.  ANESTHESIA:  GOT.  ESTIMATED BLOOD LOSS:  Minimal.  DESCRIPTION OF PROCEDURE:  After induction of general anesthesia, standard prepping and draping with Ancef prophylaxis, the usual arthroscopic sheets, drape, and pouch were applied.  Stockinette and Coban were then applied to the left upper extremity.  The scope was placed from a posterior approach, and the shoulder immediately showed full-thickness rotator cuff tear with frayed undersurface of the rotator cuff and visualization of the acromion.  The labrum showed tearing, and also there was a complete tear of the long head of the biceps tendon.  Through an anterior portal, a Cuda shaver was used to debride the stump of the biceps tendon.  The superior portion of the labrum was intact.  Due to extravasation of the fluid, arthroscope was removed for a short period of time.  A sterile skin marker was used in the skin, and incision was made along the anterior aspect of the acromion.  The deltoid was peeled off with a Bovie, revealing a large spur.  A 3/4 inch straight osteotome was used to perform an anterior inferior acromioplasty.  A small bleeder off the coracoacromial artery branch was coagulated.  The distal aspect of the clavicle had a spur, which was removed with the [facility]  rongeur. Undersurface was rasped and was smooth, and the anterior aspect of the acromion was trimmed with the [facility] rongeur.  Bursectomy was performed, complete rotator cuff tear was visualized.  There was some portion of the subscap still intact.  The bone was freshened up with a rongeur and osteotome. Ultrafix anchors x 2 were fixed, one anterior and one posteriorly, repairing the rotator cuff.  Oversewing with #2 was performed to the old stump to smooth the repair.  The shoulder was rotated.  There was a good repair with no gaps left.  Undersurface of the acromion was smoothed, distal clavicle was smoothed.  After irrigation with saline solution, deltoid was repaired with #2 Tycron back through drill holes in the bone made with the needle driver and a needle.  Vicryl 2-0 in the subcutaneous tissue, skin staple closure, Marcaine infiltration in the skin, and postop dressing and shoulder immobilizer. Overnight stay is appropriate for treatment of this patient, office follow-up in one week. Dictated by:   Veverly Fells Ophelia Charter, M.D. Attending:  Veverly Fells. Ophelia Charter, M.D. DD:  08/01/01 TD:  08/01/01 Job: [id number]

## 2011-03-09 NOTE — Discharge Summary (Signed)
[place]. [facility]  Patient:    [name], [name]                        MRN: [id number] Adm. Date:  [id number] Disc. Date: 05/28/00 Attending:  Jacki Cones Dictator:   Quinn Plowman, P.A.-C.                           Discharge Summary  DATE OF BIRTH:  1918-08-31  ADMISSION DIAGNOSIS:  Rheumatoid arthritis with C1-C2 instability.  DISCHARGE DIAGNOSIS:  Rheumatoid arthritis with C1-C2 instability.  ADDITIONAL DIAGNOSES:  1. Rheumatoid arthritis.  2. Gastroesophageal reflux disease.  3. Osteoarthritis.  4. Depression.  5. Hypertension.  6. Menieres disease.  7. History of cerebrovascular accident.  8. Irritable bowel syndrome.  9. Hearing loss. 10. Estrogen replacement. 11. Cataracts. 12. Status post hysterectomy and bilateral salpingo-oophorectomy. 13. Status post tonsillectomy and adenoidectomy. 14. Status post cholecystectomy. 15. Status post partial pancreatectomy.  SERVICE:  Veverly Fells. Ophelia Charter, M.D., Orthopedics.  PROCEDURE:  On May 27, 2000, the patient underwent a C1-C2 posterior fusion with a right posterior bone graft from the iliac crest performed by Annell Greening, M.D., assistant Colon Flattery. Ollen Bowl, M.D., second assistant [facility], P.A.-C.  Anesthesia was general.  Estimated blood loss 200 ml.  No specimens, no drain, no complications.  The patient was transferred to PACU in stable condition.  LABORATORY FINDINGS:  On May 27, 2000, urinalysis revealed brown colored urine, appearance turbid, with large amount of glucose and hemoglobin with large ketones 15, total protein greater than 300, and nitrites positive. Urine microscopic revealed many bacteria.  White blood count 6.8, RBC count 3.86, hemoglobin 10.6, hematocrit 31.6, MCV 81.8, MCH 33.5, platelet count 166.  Differential revealed 85 neutrophils, 10 lymphocytes, 3 monocytes, 1 eosinophil, 0 basophils.  Sodium 132, potassium 2.4, chloride 97, carbon dioxide 26, glucose 460,  BUN 76, creatinine 3.3, albumin 2.7. DD:  05/28/00 TD:  05/29/00 Job: [id number]

## 2011-03-09 NOTE — Op Note (Signed)
[place]. [facility]  Patient:    [name], [name]                        MRN: [id number] Proc. Date: 12/13/99 Adm. Date:  [id number] Disc. Date: [id number] Attending:  Orland Mustard CC:         Lennon Alstrom. Felipa Eth, M.D.                           Operative Report  PROCEDURE:  Esophagogastroduodenoscopy.  MEDICATIONS:  The patient received no Hurricaine spray or Xylocaine since she had a history of an allergy to "novocaine."  The result of this was that she nearly had a cardiac arrest many years ago when given novocaine.  She is not sure if this really was novocaine or Xylocaine.  For this reason, we elected to perform the procedure with no topical sedation.   She did receive Versed 3 mg and fentanyl 40 mg.  INDICATION:  Upper abdominal pain, nausea, and heme-positive stools.  This woman has had a previous colonoscopy that was normal.  DESCRIPTION OF PROCEDURE:  The procedure had been explained to the patient and consent obtained.  The patient was placed in the left lateral decubitus position. The Olympus video endoscope was inserted blindly into the esophagus to agglutination.  The patient had absolutely no problem swallowing the scope despite no topical sedation. The stomach was entered, porus was identified and passed.  The duodenum including bulb and second portion were seen well.  The scope was withdrawn back into the stomach, and the duodenum including the bulb and second  portion were normal.  The pyloric channel, atrum, and body were normal.  The fundus and cardia were seen on retroflexed view and was normal.  There was a small hiatal hernia.   The distal and proximal esophagus were normal.  The scope was withdrawn. The patient tolerated the procedure well and was maintained on low-flow oxygen nd pulse oximetry throughout the procedure with no obvious problem.  ASSESSMENT: 1. Small hiatal hernia. 2. Otherwise normal endoscopy.  PLAN:   Will ask the patient to call our office to schedule a CT scan of the abdomen, and she will keep her scheduled visit in early March DD:  12/13/99 TD:  12/13/99 Job: [id number]

## 2011-03-09 NOTE — Op Note (Signed)
[name], [name]               ACCOUNT NO.:  [id number]   MEDICAL RECORD NO.:  [id number]          PATIENT TYPE:  INP   LOCATION:  5016                         FACILITY:  [facility]   PHYSICIAN:  Mark C. Ophelia Charter, M.D.    DATE OF BIRTH:  1917/11/01   DATE OF PROCEDURE:  12/22/2004  DATE OF DISCHARGE:                                 OPERATIVE REPORT   PREOPERATIVE DIAGNOSIS:  Left shoulder rheumatoid arthritis.   POSTOPERATIVE DIAGNOSIS:  Left shoulder rheumatoid arthritis.   PROCEDURE:  Left total shoulder arthroplasty.   SURGEON:  Mark C. Ophelia Charter, M.D.   ANESTHESIA:  GOT.   ESTIMATED BLOOD LOSS:  Minimal.   DESCRIPTION OF PROCEDURE:  After induction of general anesthesia with  preoperative scalene block, standard prep and drape using the schliem  table  with the patient in the beach chair position, preoperative Ancef was given.  DuraPrep was used.  Impervious Stockinette, split sheets, sterile skin  marker, Coban and Betadine, Vi-Drape x2 was used.  Deltopectoral incision  was made.  Cephalic vein was taken laterally with the arm.  Conjoined tendon  was pulled medially for protection of the neurovascular bundle and self-  retaining shoulder retractor set was used with the deep blades.  Three  vessels just inferior to the subscap were identified.  Subscap was divided,  tagged on both sides with #1 Ethibond sutures for later closure.  Capsule  was opened.  There was chronic synovium which was trimmed out and some rice  bodies and small calcified bodies.  Head was delivered and there was a deep  groove present in the mid portion of the head which was about 3 mm deep,  irregular in shape and extended 75% of the distance from the top to the  bottom of the head.  Shoulder guide was used to cut the head in 30 degrees  of retroversion.  Sequential hand reaming and broaching was performed for an  8 size.  A 52 ball was appropriate in size.  Glenoid was prepared resecting  the labrum.   Biceps tendon was preserved reaming and then using the DePuy  global shoulder peg prosthesis.  Center position was marked.  The pegs were  drilled.  Cement was spared in the central hole with the larger peg.  Excessive cement was removed while pressure was held and the glenoid was  down flush.  After 15 minutes, glenoid was tested.  It was stable.  The  humerus was press-fit, had good stability and no cement was necessary.  The  glenoid was a 44 and a 48.  The humerus was identical to the resected head  portion so it was selected.  An 8 mm stem, 48 mm +21 ball was inserted.  Shoulder was tested for stability.  There was some deficiency in the  posterior rotator cuff.  She had had two rotator cuff tears in the past and  due to rheumatoid arthritis, there was extensive destruction of the synovium  and articular cartilage as expected with her significant disease.  With the shoulder back in place, subscap was closed anatomically.  Hemovac  drain was placed.  Subcutaneous tissue closed with 2-0 Vicryl, skin staple  closure, postoperative dressing, shoulder immobilizer.  Instrument count and  needle count was correct.  The patient was transferred to the recovery room  in stable condition.      MCY/MEDQ  D:  12/22/2004  T:  12/22/2004  Job:  [id number]

## 2011-03-09 NOTE — Discharge Summary (Signed)
Valerie, Alvarado               ACCOUNT NO.:  [id number]   MEDICAL RECORD NO.:  [id number]          PATIENT TYPE:  INP   LOCATION:  5016                         FACILITY:  [facility]   PHYSICIAN:  Mark C. Ophelia Charter, M.D.    DATE OF BIRTH:  07/29/1918   DATE OF ADMISSION:  12/22/2004  DATE OF DISCHARGE:  12/25/2004                                 DISCHARGE SUMMARY   FINAL DIAGNOSIS:  Rheumatoid arthritis, with glenohumeral destructive  arthropathy.   PROCEDURE:  Left total shoulder arthroplasty on December 22, 2004.   ADDITIONAL DIAGNOSES:  1.  Rheumatoid arthritis.  2.  Rotator cuff syndrome.  3.  Total knee replacements.  4.  Hyperlipidemia.  5.  Diabetes type 2.  6.  Hypertension.  7.  Osteoporosis.   HOSPITAL COURSE:  The patient was admitted after informed consent, taken to  the operating room, and underwent left total shoulder arthroplasty.  Metaphyseal bone and the humerus looked good.  There was firm fixation, and  no cement was added to the humeral component.  _Glenoid component was  cemented in the usual fashion, with DePuy Global Advantage shoulder being  used.  Pegs were used on the glenoid.  Humeral stem was 8 mm in diameter,  and 33 mm in length.  A 48 x 21 ball was used.  Postoperatively, the patient  was slow to convert off IV pain medications to p.o. pain medicine, and as  has been her usual course after procedures it took her a few days to  mobilize with her rheumatoid arthritis to get pain controlled, and then was  discharged with elevated toilet seat already at home, and family was present  for assist.  Office followup one week after discharge.  X-rays showed good  position of the prosthesis.  Preoperative hemoglobin was 11.2,  postoperatively it was 10.3 and stable.       MCY/MEDQ  D:  04/02/2005  T:  04/02/2005  Job:  [id number]

## 2011-03-09 NOTE — Discharge Summary (Signed)
NAME:  [name], [name]                         ACCOUNT NO.:  [id number]   MEDICAL RECORD NO.:  [id number]                   PATIENT TYPE:  INP   LOCATION:  5011                                 FACILITY:  [facility]   PHYSICIAN:  Mark C. Ophelia Charter, M.D.                 DATE OF BIRTH:  August 26, 1918   DATE OF ADMISSION:  12/21/2002  DATE OF DISCHARGE:  12/28/2002                                 DISCHARGE SUMMARY   FINAL DIAGNOSES:  1. Status post right total knee arthroplasty.  2. Rheumatoid arthritis.  3. Hypertension.  4. Hypothyroidism.   HISTORY OF PRESENT ILLNESS:  This 77 year old white female was seen in our  office with a long history of right knee osteoarthritis with chronic pain  and swelling.  She has had progressively worsening symptoms with no response  to conservative treatment.  Significant decrease in her daily activities due  to the ongoing complaint.   Preoperative x-rays showed tricompartmental degenerative changes with  marginal osteophyte formation.   LABORATORY DATA:  Preadmission labs showed WBC 6.1, hemoglobin 12.2,  hematocrit 35.6, and platelets 257.  PT 12.4, INR 0.9, PTT 46.  Na 143, K  5.1, Cl 104, CO2 30, glucose 98, BUN 16, creatinine 0.9, calcium 9.9,  T3  6.6, albumin 4.0, AST 19, ALT 15, ALP 94, total bilirubin 0.2.  The  urinalysis showed large leukocyte esterase, many bacteria, and white blood  cells.   HOSPITAL COURSE:  On December 21, 2002, the patient was taken to the [place] [facility] Operating Room and a right total knee arthroplasty  procedure was performed.  The surgeon was [employer]. Ophelia Charter, M.D., and the  assistant was Genene Churn. Denton Meek.  Anesthesia was general with local  Marcaine 0.25% postoperatively.  The EBL was less than 400 mL.  There were  no surgical or anesthesia complications.  The patient was transferred to  recovery in stable condition.  Hemovac drain x 1 was inserted into the right  knee.  On December 22, 2002, good pain  control with temperature 97.2 degrees,  blood pressure 84/28, pulse 74, and respirations 20.  PT 14.0, INR 1.1, and  hemoglobin 8.5.  The Hemovac drain was pulled.  IV rate increased to 100  mL/hr.  FESO4 at 325 mg with meals started.  PT consult completed.  On December 23, 2002, good pain control.  No specific complaints.  CMP at 0-40 degrees.  Vital signs stable and afebrile.  PT 17.8, INR 1.6, and hemoglobin 7.8.  The  patient was transfused one unit of packed red blood cells.  Dressing change  ordered.  Dilaudid PCA discontinued and started on [employer].  IV rate  decreased to 65 mL/hr.  CPM increased 0-55 degrees.  On December 24, 2002, the  patient was stable with no complaints.  PT 21.7, INR 2.1, and hemoglobin  9.2.  The wound looked  good.  Staples intact.  Rehabilitation consult  ordered.  Foley discontinued.  On December 25, 2002, the patient was stable with  a hematocrit 28.9.  On December 26, 2002, the patient was stable.  Slow moving  with PT.  Rehabilitation pending.  On December 27, 2002, hemoglobin 9.7 and INR  3.2.  On December 28, 2002, good pain control.  The nurse stated that the  patient fell out of bed earlier in the a.m.  The patient denied any  injuries.  She denied head trauma.  Vital signs were stable and she was  afebrile.  PT 24.6, INR 2.6.  CPM 0-80 degrees.  Mepergan Forte discontinued  and the patient was given Darvocet one to two tablets p.o. q.4-6h. p.r.n.  Xanax changed to 0.25 mg q.h.s. p.r.n.  The patient was doing well up to  this point and a rehabilitation bed is available and ready for transfer.   DISCHARGE MEDICATIONS:  Resume all current medications.   CONDITION ON DISCHARGE:  Good and stable.   DISPOSITION:  Discharged to rehabilitation.   DISCHARGE INSTRUCTIONS:  The patient will continue to work with PT to work  on ambulation, range of motion, and quadriceps strengthening.  Staples to be  removed two weeks postoperatively.  Continue Coumadin for DVT prophylaxis  x  3-4 weeks postoperatively.  Office follow-up one week after discharge from  rehabilitation with repeat x-rays and wound inspection.  If there are any  problems or complications before that time, she will notify us.     Genene Churn. Denton Meek.                      Mark C. Ophelia Charter, M.D.    JMO/MEDQ  D:  01/31/2003  T:  01/31/2003  Job:  [id number]

## 2011-03-09 NOTE — Op Note (Signed)
NAMECARLYLE, ACHENBACH               ACCOUNT NO.:  [id number]   MEDICAL RECORD NO.:  [id number]          PATIENT TYPE:  INP   LOCATION:  5021                         FACILITY:  [facility]   PHYSICIAN:  Mark C. Ophelia Charter, M.D.    DATE OF BIRTH:  Dec 04, 1917   DATE OF PROCEDURE:  01/08/2005  DATE OF DISCHARGE:                                 OPERATIVE REPORT   PREOPERATIVE DIAGNOSIS:  Status post total shoulder arthroplasty with  humeral Press-Fit instability.   POSTOPERATIVE DIAGNOSIS:  Status post total shoulder arthroplasty with  humeral Press-Fit instability.   PROCEDURE:  Reexploration, left total shoulder arthroplasty, and revision of  humeral prosthesis to cemented stem.   SURGEON:  Annell Greening, M.D.   ASSISTANT:  Doneen Poisson, M.D.   ANESTHESIA:  GOT.   ESTIMATED BLOOD LOSS:  Less than 100 mL.   BRIEF HISTORY:  This is an 77 year old female who had total shoulder  arthroplasty performed on December 22, 2004.  She was in the hospital several  days due to pain, rheumatoid arthritis and difficulty handing postop pain,  and was discharged.  A few days after she was discharged home, she was  trying to get up out of a chair, was leaning on her elbow and with  transfers, felt sudden sharp pain in her shoulder.  She was seen in the  office and x-rays demonstrated that the humeral prosthesis, which was a  Press-Fit design, which was stable at the time of placement, had dislodged  and spun into excessive retroversion.  CT scan was obtained which  demonstrated no fracture of the greater tuberosity.  Stem had backed up  about a 1.5 to 2 cm and then spun into retroversion.  She is admitted at  this time for re-fixation with cement.   PROCEDURE:  After induction of general anesthesia with preoperative Ancef  prophylaxis, with the patient on the Schlein table, standard prepping and  draping were performed.  Skin staples were removed prior to prepping with  Duraprep.  The usual-length  impervious stockinette, Coban and shoulder split  sheets were used.  The old incision was bluntly opened with tips of the  scissors and blunt dissection with spreading, cutting some of the 2-0 Vicryl  and subcutaneous tissue.  The conjoined tendon was identified and self-  retaining retractor placed.  Subscapularis was cut again and the old  Ethibond #2 sutures were not visualized.  FiberWire sutures were placed in  the tendon as it was divided and cut in the tendon was exactly at the level  of the previous cut with visualized Ethibond sutures.  Visualization showed  that the prosthesis had slipped in retroversion about 90 degrees.  The  glenoid was solid.  There was no any destruction of the glenoid from the  retroversion on the humeral prosthesis.  Gently, the humeral prosthesis was  grasped underneath the ball with the bone hook to fix the excessive  retroversion and this slowly was then tapped down into the humeral shaft,  which allowed the head to then be delivered out through the incision.  That  was kept at  90 degrees and the prosthesis was removed after hooking a sponge  underneath it and removing it all in one piece.  Inspection revealed that  granulation tissue had formed all around the stem; it was curetted.  The  tissue was normal in appearance and it appeared that some of this was the  rheumatoid-type tissue that she had with no evidence of infection.  Glenoid  was tested and was stable.  Pulsatile lavage was used for preparation of  canal after curetting.  A #1 cement restrictor was placed distally.  Cement  gun was used for vacuum mixing, placement under pressure and then the ball  was popped off the stem.  The old stem was washed and then re-cemented into  place.  No cultures were obtained, since there was no evidence of infection;  the patient had no fever, no elevated white count and all tissue looked  good.  Stem was stable after cementing it.  Retroversion was checked and  it  was in 30-35 degrees or possibly 40 degrees of retroversion.  The ball was  pointing at the glenoid, as expected.  Range of motion was checked after  cement was hard at 15 minutes and then the subscapularis was repaired with  the FiberWire suture, 2-0 Vicryl in the subcutaneous tissue after Hemovac  drain was placed, skin staple closure and postop dressing and sling.  Instrument count and needle count were correct.      MCY/MEDQ  D:  01/08/2005  T:  01/09/2005  Job:  [id number]

## 2011-03-09 NOTE — Op Note (Signed)
NAME:  [name], [name]                         ACCOUNT NO.:  [id number]   MEDICAL RECORD NO.:  [id number]                   PATIENT TYPE:  INP   LOCATION:  5019                                 FACILITY:  [facility]   PHYSICIAN:  Mark C. Ophelia Charter, M.D.                 DATE OF BIRTH:  02/08/1918   DATE OF PROCEDURE:  02/11/2004  DATE OF DISCHARGE:  02/13/2004                                 OPERATIVE REPORT   PREOPERATIVE DIAGNOSIS:  Left shoulder complete rotator cuff tear.   POSTOPERATIVE DIAGNOSIS:  Left shoulder complete rotator cuff tear.   PROCEDURE:  Left shoulder rotator cuff repair.   SURGEON:  Mark C. Ophelia Charter, M.D.   ASSISTANT:  Sandrea Matte, P.A.   ANESTHESIA:  GOT.   ESTIMATED BLOOD LOSS:  Minimal.   PROCEDURE:  After induction of general anesthesia and orotracheal  intubation, the patient was placed in the beach-chair position with standard  prep-and-drape and preoperative antibiotic prophylaxis.  The usual pervious  stockinette, Coban, and split sheaths and drapes were applied with Betadine  and Viadrape, sealing the skin.  An incision was made to the acromion in  line with the anterior aspect.  The acromion and deltoid was peeled off.  The spur was removed with a 3/4 straight osteotome.  There was complete tear  of the rotator cuff.  Edges were freshened, and the under surface of the  acromion was filed down smooth.  The edge of the cuff was cut back.  Rotator  cuff Quick Anchor Plus Miteks were used for placement, and the cuff was  advanced and repaired back down to the bone, which had been freshened up  with a Baer rongeur until bleeding surfaced.  After irrigation with the  saline solution, the deltoid was repaired, including the 2 cm split lateral  to the acromion.  After irrigation, subcutaneous tissue was reapproximated.  The skin was closed with nylon.  A postoperative dressing and shoulder  immobilizer.  Instrument count and needle counts were correct.                                         Mark C. Ophelia Charter, M.D.    MCY/MEDQ  D:  04/21/2004  T:  04/22/2004  Job:  [id number]

## 2011-03-09 NOTE — Discharge Summary (Signed)
NAMEHARPER, [name]                         ACCOUNT NO.:  [id number]   MEDICAL RECORD NO.:  [id number]                   PATIENT TYPE:  INP   LOCATION:  5011                                 FACILITY:  [facility]   PHYSICIAN:  Ranelle Oyster, M.D.             DATE OF BIRTH:  1918-06-16   DATE OF ADMISSION:  12/21/2002  DATE OF DISCHARGE:  12/28/2002                                 DISCHARGE SUMMARY   DISCHARGE DIAGNOSES:  1. Right total knee replacement secondary to osteoarthritis.  2. Hypertension.  3. Postoperative anemia.  4. Gastroesophageal reflux disease.   HISTORY OF PRESENT ILLNESS:  Ms. [name] is an 77 year old female with a  history of hypertension, hypothyroidism, right knee OA, who elected to  undergo right total knee replacement on 12/21/02 by Dr. Ophelia Charter.  Postoperatively she has been on Coumadin for DVT prophylaxis.  She did  receive two units of packed red blood cells secondary to a drop in her  hemoglobin and hematocrit.  The last CBC showed an hemoglobin and hematocrit  of 9.4 and 26.8.  The patient is noted to be __________ walker to increase  endurance reported.   PAST MEDICAL HISTORY:  1. Hypertension.  2. Gastroesophageal reflux disease.  3. Hepatitis.  4. Anxiety depression.  5. Pleural effusion x2.  6. Cholecystectomy.  7. Hysterectomy and BSO.  8. Tonsillectomy.  9. Bilateral shoulder __________.  10.      Meniere's' disease.  11.      Insomnia.   ALLERGIES:  1. NOVOCAINE.  2. PERCODAN.  3. __________.  4. VICODIN.  5. SULFA.  6. CELEBREX.  7. PRILOSEC.   SOCIAL HISTORY:  The patient is married.  She was independent and active  prior to admission.  She does not use any tobacco or alcohol.   HOSPITAL COURSE:  Ms. [name] is admitted to the __________ on 12/2002  for __________ therapy.  Past admission she was admitted on Coumadin for DVT  prophylaxis.  Blood pressures were monitored on a b.i.d. basis and have been  stable off Altace.   Pain control was managed with p.r.n. Ultram and  Darvocet.  Labs during past admission showed postoperative anemia to be  improving with hemoglobin and hematocrit of 10.1 and 29.0.  Sodium 138,  potassium 3.5, chloride 101, CO2 29, BUN 7, creatinine 0.8, glucose 112.  PT  and OT were initiated during her stay in the __________.  The patient was  modified independent for transfers, modified independent for ambulating 125  feet with standard walker, knee flexion at 82 degrees.  The patient was  modified independent for activities of daily living needs including hygiene,  as well as kitchen management skills.  Further follow up therapy to include  home health, PT, OT, and advanced wound care prior to discharge.  Home  health R.N. has been arranged to draw pro time with Coumadin to continue  through 01/21/03.  The next pro time to be checked on 01/04/03.  On 01/01/03,  the patient was discharged to home.   DISCHARGE MEDICATIONS:  1. Ferrous sulfate 325 mg b.i.d.  2. Premarin 0.125 mg daily.  3. Levsin 0.125 mg q.h.s.  4. __________ 25 mcg daily.  5. __________ 60 mg q.h.s.  6. Zoloft 100 mg q.h.s.  7. Aciphex 20 mg daily.  8. Colace 100 mg b.i.d.  9. Multivitamin one daily.  10.      Coumadin 2 mg, 1-1/2 p.o. q.p.m.  11.      Pain management - may use Darvocet or Tylenol for pain.   ACTIVITY:  Partial weight on right leg with use of walker.   WOUND CARE:  Keep the area clean and dry.   SPECIAL INSTRUCTIONS:  1. No __________ when driving.  2. Advance home care with PT, OT, and R.N.   FOLLOW UP:  The patient is to follow up with Dr. Ophelia Charter and Dr. Felipa Eth for  recheck.  Follow up with Dr. Riley Kill as needed.     Greg Cutter, P.A.                    Ranelle Oyster, M.D.    PP/MEDQ  D:  01/01/2003  T:  01/03/2003  Job:  [id number]   cc:   Larina Earthly, M.D.  [address]  [place]  [place] [address]  Fax: [phone]   Veverly Fells. Ophelia Charter, M.D.  [address]  [place], [place] [address]   Fax: [phone]

## 2011-03-09 NOTE — Op Note (Signed)
[place]. [facility]  Patient:    [name], [name]                        MRN: [id number] Proc. Date: 05/27/00 Adm. Date:  [id number] Attending:  Jacki Cones                           Operative Report  PREOPERATIVE DIAGNOSIS:  Rheumatoid arthritis with C1-2 instability.  POSTOPERATIVE DIAGNOSIS:  Rheumatoid arthritis with C1-2 instability.  PROCEDURE:  Posterior C1-C2 fusion with wiring and right posterior iliac crest bone graft.  SURGEON:  Mark C. Ophelia Charter, M.D.  FIRST ASSISTANT:  Colon Flattery. Ollen Bowl, M.D.  SECOND ASSISTANT:  Quinn Plowman, P.A.-C.  ESTIMATED BLOOD LOSS:  200 mL  DRAINS:  None.  DESCRIPTION OF PROCEDURE:  After the induction of general anesthesia, the patient was placed in a collar and the patient had an awake intubation prior to receiving general anesthesia with the endoscope by Dr. Randa Evens due to her C1-C2 instability.  Once she was intubated and was neurologically intact, general anesthesia was given.  She was placed in a soft collar, transferred to prone position with a horseshoe head holder, chest roll with careful padding and positioning, arms tucked to the side and the neck in extension.  The occiput was shaved of some hair for exposure.  _____ were used with a mixture of benzoin and the same over the iliac crest on the right.  DuraPrep was used in both areas.  Preoperative Ancef was given prophylactically.  An incision was made after squaring it off with towels.  A sterile skin marker, Betadine Biodrape application and thyroid sheath.  A midline incision was made from occiput to C6, subcutaneous tissue was sharply dissected.  Dissection with Bovie continued in the midline.  Gelpi retractors were placed as well as cerebellar retractor dissection continued down to C2.  Crosstable lateral x-ray with a small clamp at C2 spinous process or posterior element was taken which showed the C1-C2 position was good with good  reduction.  Continued dissection was performed.  The posterior element of C1 was cleaned of soft tissue and exposed.  A pair of tongs were used laterally in the gutter and a bur was used to decorticate the bone particularly along the dorsal aspect of C1, posterior element, and dorsal and cephalad portion of C2.  In the midline a hockey-stick dural separator and black nerve hook was used to slide posterior to C1.  A 20 gauge wire was bent, meticulously passed underneath the C1 from caudad to cephalad and grafts of the black nerve hook then brought up.  The loop was closed and then it was brought around to inferior to C2 posterior element which was the bifid spinous process, tightened down.  X-ray was taken showing satisfactory position and reduction.  Next, the wire was twisted and cut and the right iliac crest was exposed. Cortical cancellous chips and then using curets more cancellous chips were harvested, meticulously prepared and then packed in small matchstick grafts in between C1-C2 and across C1-C2.  On the left superior aspect of C1 there was a small vein that was bleeding.  A small piece of Surgicel was placed over this and left.  After a bone graft had been meticulously placed down and crushed down but not overpacked between C1-C2 and between the posterior elements to prevent pressure on the cord the tip of the wire had been  bent down and cut and was not prominent and the wound was irrigated and was dry and fascia was reapproximated with 0 Vicryl in multiple interrupted simple sutures.  2-0 in the subcutaneous tissue.  Skin staple closure after marking infiltration of the skin.  Iliac crest was closed with 0 Vicryl, 2-0 Vicryl and skin staple closure.  Xeroform dressings were applied, 4 x 4 ABD to the hip, soft cervical collar, and patient was transferred in supine position and then transferred to recovery room.  Instrument, needle count was correct. DD:  05/27/00 TD:   05/28/00 Job: [id number]

## 2011-03-09 NOTE — Discharge Summary (Signed)
NAME:  [name], [name]                         ACCOUNT NO.:  [id number]   MEDICAL RECORD NO.:  [id number]                   PATIENT TYPE:  INP   LOCATION:  5025                                 FACILITY:  [facility]   PHYSICIAN:  Genene Churn. Barry Dienes, P.A.                DATE OF BIRTH:  04-18-1918   DATE OF ADMISSION:  08/14/2002  DATE OF DISCHARGE:  08/17/2002                                 DISCHARGE SUMMARY   FINAL DIAGNOSES:  1. Status post C5-6 and C6-7 anterior cervical diskectomy and fusion with     allograft and plating for C5-6 and C6-7 spondylosis/spondylitis.  2. Hypertension, not otherwise specified.  3. History of cervical degenerative disk disease.   HISTORY OF PRESENT ILLNESS:  The patient is an 77 year old white female with  a history of C5-6 and C6-7 spondylosis/spondylitis, neck pain and right  upper extremity radiculopathy presented to our office. She had progressively  worsening symptoms not responsive to conservative treatment. She had a  significant decrease in her daily activities due to the ongoing problems. An  MRI scan showed degenerative anterolisthesis at C6-7 with rheumatoid  arthritis, pannus erosion at the right uncinate joint/lamina, bilateral  recess and foraminal stenosis at C5-6, moderate degenerative disk disease at  C5-6 and C6-7.   LABORATORY DATA:  Admission labs:  WBCs 7.3, RBC 3.76, hemoglobin 11.5,  hematocrit 34.9, platelets 261. PT 12.3, INR 0.9, PTT 51. Sodium 132,  potassium 3.9, chloride 96, CO2 26, glucose 118, BUN 15, creatinine 0.9.  Calcium 9.0, albumin 3.7. AST 21, ALT 17, alkaline phosphatase 96, total  bilirubin 0.6. Urinalysis negative.   An EKG showed normal sinus rhythm.   HOSPITAL COURSE:  On August 14, 2002, the patient was taken to the [facility] operating room and a C5-6, C6-7 anterior cervical disk fusion  with allograft and plating procedure was performed. The surgeon was [employer].  Ophelia Charter, M.D. and  assistant Genene Churn.  Barry Dienes, P.A.-C.  Anesthesia was general,  estimated blood loss less than 100 cc. There were surgical or anesthesia  complications. A Hemovac drain was in place. She was transferred to recovery  in stable condition.   On August 15, 2002, the patient was stable, but did have some difficulty  swallowing. Her vital signs were stable. She was neurovascularly intact.   On August 16, 2002, the preoperative right shoulder pain was resolved. She  continued to have some difficulty swallowing. Her vital signs were stable  and she was afebrile. A clear liquid diet was continued. The patient was  very drowsy and Mepergan Forte was discontinued. She was started on  Darvocet.   On August 17, 2002, the patient was much improved. She continued to have no  arm pain. She was ready to be discharged to home.   DISCHARGE MEDICATIONS:  1. Darvocet-N 100 1 to  2 tablets q.4-6h. p.r.n. pain.  2. Resume previous home  medications.   CONDITION ON DISCHARGE:  Stable and improved.   DISCHARGE INSTRUCTIONS:  The patient was instructed to rest. Do not remove  cervical collar. No strenuous activity or heavy lifting.   FOLLOW UP:  She will follow up in the office in one week for recheck. If  there any problems or complications before that time she will call and  schedule an earlier visit.                                               Genene Churn. Denton Meek.    JMO/MEDQ  D:  09/08/2002  T:  09/08/2002  Job:  [id number]

## 2011-03-09 NOTE — Op Note (Signed)
[name], [name]               ACCOUNT NO.:  [id number]   MEDICAL RECORD NO.:  [id number]          PATIENT TYPE:  AMB   LOCATION:  ENDO                         FACILITY:  [facility]   PHYSICIAN:  James L. Malon Kindle., M.D.DATE OF BIRTH:  July 23, 1918   DATE OF PROCEDURE:  09/22/2004  DATE OF DISCHARGE:                                 OPERATIVE REPORT   PROCEDURE:  Esophagogastroduodenoscopy.   MEDICATIONS:  Fentanyl 40 mcg, Versed 4 mg IV.   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  This is done to evaluate  this are endoscopically.   DESCRIPTION OF PROCEDURE:  The procedure had been explained to the patient  and consent obtained.  In the left lateral decubitus position, the Olympus  scope was easily passed into the esophagus.  The stomach was entered,  pylorus identified and passed.  The duodenum, including the bulb and second  portion, was unremarkable.  The scope was withdrawn back in the stomach.  The antrum was normal.  Fundus and cardia were seen well on retroflexed view  and were normal.  The diaphragm was seen at 40 cm.  Because the GE junction  was at 35, there was less than a 5 cm hiatal hernia.  It was widely patent  with free reflux.  The distal and proximal esophagus were endoscopically  normal, no erosions or ulcerations.  The proximal esophagus was normal.  The  patient has a history of cricopharyngeal hyperplasia, but I was able to  easily pass the scope and could not see an obvious obstruction or blockage.  The scope was withdrawn.  The patient tolerated the procedure well.   ASSESSMENT:  1.  Gastroesophageal reflux disease, 530.81.  2.  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   PLAN:  Will continue to treat aggressively for reflux with her current  medications.  Will give her reflux instruction sheet, see back in the office  in three to four weeks.  If  the patient is willing to pursue further, I  think we should consider an antireflux procedure in conjunction with  possible Botox injection of her cricopharyngeal muscle.       JLE/MEDQ  D:  09/22/2004  T:  09/23/2004  Job:  [id number]   cc:   Larina Earthly, M.D.  [address]  [place]  [place] [address]  Fax: [phone]   Gloris Manchester. Lazarus Salines, M.D.  [address] [place]  [place] [address]  Fax: [phone]
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
## 2011-03-09 NOTE — Op Note (Signed)
[place]. [facility]  Patient:    [name], [name]                      MRN: [id number] Proc. Date: 05/28/01 Adm. Date:  [id number] Disc. Date: [id number] Attending:  Jacki Cones                           Operative Report  PREOPERATIVE DIAGNOSIS:  Right shoulder rheumatoid arthritis with complete rotator cuff tear and torn long head of the biceps tendon.  POSTOPERATIVE DIAGNOSIS:  Right shoulder rheumatoid arthritis with complete rotator cuff tear and torn long head of the biceps tendon.  OPERATION PERFORMED:  Diagnostic and operative arthroscopy of the right shoulder, debridement of labral tear, partial synovectomy, debridement of long head of the biceps tendon stump.  Open acromioplasty, removal of distal clavicular spur and open rotator cuff repair.  SURGEON:  Mark C. Ophelia Charter, M.D.  ANESTHESIA:  General.  DESCRIPTION OF PROCEDURE:  After induction of general anesthesia, patient placed in the beach chair position and preoperative Ancef given prophylactically.  Usual arthroscopic sheets, drapes, impervious sheets and drapes, stockinet and Coban were all applied.  Scope was placed in posterior portal.  Anterior portal was made with an in out technique using a large metal cannula.  A 4.2 cuda shaver was placed anteriorly.  There was significant grade 3 and grade 4 chondromalacia with portions of the femoral head cartilage completely gone with exposed subchondral bone.  The labrum had a tear at the anterior superior portion.  This was debrided and inspection of the long head of the biceps tendon showed it was torn and a portion of the stump was floating in the joint and it appeared this has been a segmental tear.  This portion was debrided.  The posterior rim was intact.  Anterior inferior portion of the shoulder was intact.  The shoulder was lax due to the complete rotator cuff tear present but did not anteriorly dislocate.  Looking at the roof there  was extremely large rotator cuff tear with water seen flowing out of the shoulder.  At this point the shoulder was aspirated dry and the cement was removed and a sterile skin marker was used and incision was made along the anterior aspect of the acromion peeling the deltoid off with Bovie electrocautery.  There was a large acromial spur which was removed with a 3/4 osteotome.  The distal aspect of the clavicle had a large downward hanging spur which was excised with a Baer rongeur and smoothed with a rasp.  There were some sharp edges of the undersurface of the acromion and this was smoothed with a rasp protecting the head of the rotator cuff with a large Cobb.  The rotator cuff was then mobilized using Allis clamp and finger dissection above and below the cuff.  Supraspinatus portion was the least retracted area.  Pulling on this helped deliver a portion of the subscapularis and teres.  Posteriorly there was severe retraction and attempts at mobilization of this was performed over 10 minutes in order to help mobilized it enough so that it could be repaired in an advanced position.  Laterally, there was a piece of 1 cm cuff adjacent to the greater tuberosity.  The bone was freshened up about 1 cm into the articular surface and denuded with Matt Holmes rongeur to good bleeding bone three Ultrafix anchors were placed and horizontal mattress sutures were used to  prepare the posterior cuff, midportion of the cuff, supraspinatus and anterior subscapularis portion. With the arm abducted these were advanced, pulled down tight and the arm was allowed to fall down to its side with no undue pressure on the repair.  The long head of the biceps was not visualized underneath the rotator cuff and not palpable in the intertuberosity groove.  Intermittent irrigation was used with the arthroscope lavage fluid.  The deltoid was repaired back through drill hole using the #2 sutures from the anchors that had been cut  and tied.  Five separate sutures were used for repair of the deltoid.  The cuff was inspected prior to repairing the deltoid and was intact.  2-0 Vicryl was used in the subcutaneous tissues, skin staple closure enclosing the anterior and posterior portal and postoperative dressing and shoulder immobilizer.  Instrument count and needle count was correct. DD:  05/28/01 TD:  05/29/01 Job: [id number]
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
## 2011-05-04 ENCOUNTER — Encounter (HOSPITAL_BASED_OUTPATIENT_CLINIC_OR_DEPARTMENT_OTHER): Payer: Medicare Other | Admitting: Oncology

## 2011-05-04 ENCOUNTER — Other Ambulatory Visit: Payer: Self-pay | Admitting: Oncology

## 2011-05-04 DIAGNOSIS — C50519 Malignant neoplasm of lower-outer quadrant of unspecified female breast: Secondary | ICD-10-CM

## 2011-05-04 LAB — CBC WITH DIFFERENTIAL/PLATELET
Basophils Absolute: 0 10*3/uL (ref 0.0–0.1)
EOS%: 4.4 % (ref 0.0–7.0)
Eosinophils Absolute: 0.3 10*3/uL (ref 0.0–0.5)
HGB: 10.8 g/dL — ABNORMAL LOW (ref 11.6–15.9)
LYMPH%: 19.5 % (ref 14.0–49.7)
MCH: 30 pg (ref 25.1–34.0)
MCV: 90.9 fL (ref 79.5–101.0)
MONO%: 8.2 % (ref 0.0–14.0)
NEUT#: 4.6 10*3/uL (ref 1.5–6.5)
Platelets: 205 10*3/uL (ref 145–400)

## 2011-05-04 LAB — COMPREHENSIVE METABOLIC PANEL
Albumin: 4.1 g/dL (ref 3.5–5.2)
Alkaline Phosphatase: 104 U/L (ref 39–117)
BUN: 11 mg/dL (ref 6–23)
Creatinine, Ser: 0.86 mg/dL (ref 0.50–1.10)
Glucose, Bld: 103 mg/dL — ABNORMAL HIGH (ref 70–99)
Total Bilirubin: 0.3 mg/dL (ref 0.3–1.2)

## 2011-05-07 ENCOUNTER — Ambulatory Visit
Admission: RE | Admit: 2011-05-07 | Discharge: 2011-05-07 | Disposition: A | Discharge status: Home / Self Care | Payer: Medicare Other | Source: Ambulatory Visit | Attending: Oncology | Admitting: Oncology

## 2011-05-07 DIAGNOSIS — Z9889 Other specified postprocedural states: Secondary | ICD-10-CM

## 2011-05-11 ENCOUNTER — Encounter (HOSPITAL_BASED_OUTPATIENT_CLINIC_OR_DEPARTMENT_OTHER): Payer: Medicare Other | Admitting: Oncology

## 2011-05-11 DIAGNOSIS — C50519 Malignant neoplasm of lower-outer quadrant of unspecified female breast: Secondary | ICD-10-CM

## 2011-06-20 ENCOUNTER — Other Ambulatory Visit: Payer: Self-pay | Admitting: Oncology

## 2011-06-20 ENCOUNTER — Encounter (HOSPITAL_BASED_OUTPATIENT_CLINIC_OR_DEPARTMENT_OTHER): Payer: Medicare Other | Admitting: Oncology

## 2011-06-20 DIAGNOSIS — C50519 Malignant neoplasm of lower-outer quadrant of unspecified female breast: Secondary | ICD-10-CM

## 2011-06-20 LAB — CBC & DIFF AND RETIC
Basophils Absolute: 0 10*3/uL (ref 0.0–0.1)
EOS%: 0 % (ref 0.0–7.0)
HCT: 32 % — ABNORMAL LOW (ref 34.8–46.6)
HGB: 10.8 g/dL — ABNORMAL LOW (ref 11.6–15.9)
Immature Retic Fract: 1.9 % (ref 1.60–10.00)
LYMPH%: 9.3 % — ABNORMAL LOW (ref 14.0–49.7)
MCH: 29.3 pg (ref 25.1–34.0)
MONO#: 0.5 10*3/uL (ref 0.1–0.9)
NEUT%: 86.4 % — ABNORMAL HIGH (ref 38.4–76.8)
Platelets: 241 10*3/uL (ref 145–400)
lymph#: 1.1 10*3/uL (ref 0.9–3.3)

## 2011-06-20 LAB — MORPHOLOGY: PLT EST: ADEQUATE

## 2011-06-20 LAB — CHCC SMEAR

## 2011-06-22 LAB — IRON AND TIBC
TIBC: 302 ug/dL (ref 250–470)
UIBC: 244 ug/dL (ref 125–400)

## 2011-06-22 LAB — PROTEIN ELECTROPHORESIS, SERUM
Alpha-2-Globulin: 12.5 % — ABNORMAL HIGH (ref 7.1–11.8)
Gamma Globulin: 11.8 % (ref 11.1–18.8)

## 2011-06-22 LAB — FOLATE RBC: RBC Folate: 781 ng/mL (ref >=366)

## 2011-06-22 LAB — ERYTHROPOIETIN: Erythropoietin: 1.9 m[IU]/mL — ABNORMAL LOW (ref 2.6–34.0)

## 2011-06-22 LAB — FERRITIN: Ferritin: 152 ng/mL (ref 10–291)

## 2011-08-28 ENCOUNTER — Ambulatory Visit (HOSPITAL_COMMUNITY): Payer: Medicare Other | Attending: Internal Medicine | Admitting: Radiology

## 2011-08-28 ENCOUNTER — Other Ambulatory Visit (HOSPITAL_COMMUNITY): Payer: Self-pay | Admitting: Internal Medicine

## 2011-08-28 DIAGNOSIS — R0602 Shortness of breath: Secondary | ICD-10-CM

## 2011-08-28 DIAGNOSIS — I369 Nonrheumatic tricuspid valve disorder, unspecified: Secondary | ICD-10-CM

## 2011-08-28 DIAGNOSIS — R0989 Other specified symptoms and signs involving the circulatory and respiratory systems: Secondary | ICD-10-CM

## 2011-08-28 DIAGNOSIS — I379 Nonrheumatic pulmonary valve disorder, unspecified: Secondary | ICD-10-CM | POA: Insufficient documentation

## 2011-08-28 DIAGNOSIS — I079 Rheumatic tricuspid valve disease, unspecified: Secondary | ICD-10-CM | POA: Insufficient documentation

## 2011-08-28 DIAGNOSIS — R0609 Other forms of dyspnea: Secondary | ICD-10-CM | POA: Insufficient documentation

## 2011-08-28 DIAGNOSIS — I080 Rheumatic disorders of both mitral and aortic valves: Secondary | ICD-10-CM | POA: Insufficient documentation

## 2011-08-28 DIAGNOSIS — I517 Cardiomegaly: Secondary | ICD-10-CM

## 2011-08-29 ENCOUNTER — Encounter (HOSPITAL_COMMUNITY): Payer: Self-pay | Admitting: Internal Medicine

## 2011-10-20 ENCOUNTER — Telehealth: Payer: Self-pay | Admitting: Oncology

## 2011-10-20 NOTE — Telephone Encounter (Signed)
Talked to pt, gave her appt dor lab on 11/16/11 and MD visit on 11/20/11

## 2011-11-13 ENCOUNTER — Other Ambulatory Visit: Payer: Self-pay | Admitting: Oncology

## 2011-11-13 DIAGNOSIS — C50519 Malignant neoplasm of lower-outer quadrant of unspecified female breast: Secondary | ICD-10-CM

## 2011-11-16 ENCOUNTER — Other Ambulatory Visit (HOSPITAL_BASED_OUTPATIENT_CLINIC_OR_DEPARTMENT_OTHER): Payer: Medicare Other | Admitting: Lab

## 2011-11-16 DIAGNOSIS — C50519 Malignant neoplasm of lower-outer quadrant of unspecified female breast: Secondary | ICD-10-CM

## 2011-11-16 LAB — CBC WITH DIFFERENTIAL/PLATELET
Basophils Absolute: 0 10*3/uL (ref 0.0–0.1)
HCT: 36.3 % (ref 34.8–46.6)
HGB: 12.1 g/dL (ref 11.6–15.9)
MONO#: 0.6 10*3/uL (ref 0.1–0.9)
NEUT%: 69.3 % (ref 38.4–76.8)
WBC: 7.6 10*3/uL (ref 3.9–10.3)
lymph#: 1.5 10*3/uL (ref 0.9–3.3)

## 2011-11-17 LAB — COMPREHENSIVE METABOLIC PANEL
BUN: 17 mg/dL (ref 6–23)
CO2: 24 mEq/L (ref 19–32)
Calcium: 9.1 mg/dL (ref 8.4–10.5)
Chloride: 106 mEq/L (ref 96–112)
Creatinine, Ser: 1.12 mg/dL — ABNORMAL HIGH (ref 0.50–1.10)
Glucose, Bld: 89 mg/dL (ref 70–99)

## 2011-11-17 LAB — VITAMIN D 25 HYDROXY (VIT D DEFICIENCY, FRACTURES): Vit D, 25-Hydroxy: 27 ng/mL — ABNORMAL LOW (ref 30–89)

## 2011-11-20 ENCOUNTER — Ambulatory Visit (HOSPITAL_BASED_OUTPATIENT_CLINIC_OR_DEPARTMENT_OTHER): Payer: Medicare Other | Admitting: Oncology

## 2011-11-20 VITALS — BP 159/69 | HR 83 | Temp 98.3°F | Ht 61.0 in | Wt 127.8 lb

## 2011-11-20 DIAGNOSIS — C50519 Malignant neoplasm of lower-outer quadrant of unspecified female breast: Secondary | ICD-10-CM

## 2011-11-20 DIAGNOSIS — C50919 Malignant neoplasm of unspecified site of unspecified female breast: Secondary | ICD-10-CM

## 2011-11-20 DIAGNOSIS — E559 Vitamin D deficiency, unspecified: Secondary | ICD-10-CM

## 2011-11-20 DIAGNOSIS — C50411 Malignant neoplasm of upper-outer quadrant of right female breast: Secondary | ICD-10-CM | POA: Insufficient documentation

## 2011-11-20 DIAGNOSIS — Z170 Estrogen receptor positive status [ER+]: Secondary | ICD-10-CM

## 2012-06-18 ENCOUNTER — Ambulatory Visit: Payer: Medicare Other | Admitting: Oncology

## 2012-06-18 ENCOUNTER — Other Ambulatory Visit: Payer: Medicare Other | Admitting: Lab

## 2012-08-24 ENCOUNTER — Other Ambulatory Visit: Payer: Self-pay | Admitting: Oncology

## 2012-09-03 ENCOUNTER — Telehealth: Payer: Self-pay | Admitting: *Deleted

## 2012-09-03 ENCOUNTER — Telehealth: Payer: Self-pay | Admitting: Oncology

## 2012-09-03 NOTE — Telephone Encounter (Signed)
Patient refill request received for femara 2.5 mg tabs that was last filled 08-24-2012.  Patient notified she must make an appointment.  Transferred to scheduler to leave a message to reschedule this appointment.  Denies receiving refill but Pharmacist says she picked this up on 08-25-2012.  Patient able to tell me the office phone number and knows she is to call the office to reschedule if she does not receive a return call from scheduler.

## 2012-09-03 NOTE — Telephone Encounter (Signed)
S/w the pt and she is aware of her nov appts. °

## 2012-09-09 ENCOUNTER — Ambulatory Visit (HOSPITAL_BASED_OUTPATIENT_CLINIC_OR_DEPARTMENT_OTHER): Payer: Medicare Other | Admitting: Oncology

## 2012-09-09 ENCOUNTER — Telehealth: Payer: Self-pay | Admitting: Oncology

## 2012-09-09 ENCOUNTER — Other Ambulatory Visit (HOSPITAL_BASED_OUTPATIENT_CLINIC_OR_DEPARTMENT_OTHER): Payer: Medicare Other | Admitting: Lab

## 2012-09-09 VITALS — BP 138/65 | HR 93 | Temp 98.5°F | Resp 18 | Ht 61.0 in | Wt 129.1 lb

## 2012-09-09 DIAGNOSIS — C50519 Malignant neoplasm of lower-outer quadrant of unspecified female breast: Secondary | ICD-10-CM

## 2012-09-09 DIAGNOSIS — Z170 Estrogen receptor positive status [ER+]: Secondary | ICD-10-CM

## 2012-09-09 DIAGNOSIS — E559 Vitamin D deficiency, unspecified: Secondary | ICD-10-CM

## 2012-09-09 DIAGNOSIS — C50919 Malignant neoplasm of unspecified site of unspecified female breast: Secondary | ICD-10-CM

## 2012-09-09 DIAGNOSIS — G8929 Other chronic pain: Secondary | ICD-10-CM

## 2012-09-09 LAB — COMPREHENSIVE METABOLIC PANEL (CC13)
ALT: 11 U/L (ref 0–55)
Albumin: 3.9 g/dL (ref 3.5–5.0)
CO2: 26 mEq/L (ref 22–29)
Chloride: 109 mEq/L — ABNORMAL HIGH (ref 98–107)
Glucose: 86 mg/dl (ref 70–99)
Potassium: 3.9 mEq/L (ref 3.5–5.1)
Sodium: 141 mEq/L (ref 136–145)
Total Bilirubin: 0.48 mg/dL (ref 0.20–1.20)
Total Protein: 6.7 g/dL (ref 6.4–8.3)

## 2012-09-09 LAB — CBC WITH DIFFERENTIAL/PLATELET
Basophils Absolute: 0 10*3/uL (ref 0.0–0.1)
Eosinophils Absolute: 0.3 10*3/uL (ref 0.0–0.5)
HGB: 11.5 g/dL — ABNORMAL LOW (ref 11.6–15.9)
MCV: 93 fL (ref 79.5–101.0)
MONO#: 0.7 10*3/uL (ref 0.1–0.9)
MONO%: 9.5 % (ref 0.0–14.0)
NEUT#: 4.8 10*3/uL (ref 1.5–6.5)
RBC: 3.61 10*6/uL — ABNORMAL LOW (ref 3.70–5.45)
RDW: 14.4 % (ref 11.2–14.5)
WBC: 7 10*3/uL (ref 3.9–10.3)
lymph#: 1.2 10*3/uL (ref 0.9–3.3)

## 2012-09-09 MED ORDER — LETROZOLE 2.5 MG PO TABS: 2.5000 mg | ORAL_TABLET | Freq: Every day | ORAL | Status: DC

## 2012-09-09 NOTE — Telephone Encounter (Signed)
S/w the pt and she is aware of her nov 2014 appts

## 2012-09-10 LAB — VITAMIN D 25 HYDROXY (VIT D DEFICIENCY, FRACTURES): Vit D, 25-Hydroxy: 25 ng/mL — ABNORMAL LOW (ref 30–89)

## 2012-09-11 ENCOUNTER — Other Ambulatory Visit: Payer: Self-pay | Admitting: *Deleted

## 2012-09-11 DIAGNOSIS — C50919 Malignant neoplasm of unspecified site of unspecified female breast: Secondary | ICD-10-CM

## 2012-09-11 MED ORDER — MEGESTROL ACETATE 20 MG PO TABS: 20.0000 mg | ORAL_TABLET | Freq: Every day | ORAL | Status: DC

## 2012-09-25 ENCOUNTER — Ambulatory Visit
Admission: RE | Admit: 2012-09-25 | Discharge: 2012-09-25 | Disposition: A | Discharge status: Home / Self Care | Payer: Medicare Other | Source: Ambulatory Visit | Attending: Oncology | Admitting: Oncology

## 2012-09-25 DIAGNOSIS — C50919 Malignant neoplasm of unspecified site of unspecified female breast: Secondary | ICD-10-CM

## 2012-10-05 NOTE — Progress Notes (Signed)
Hematology and Oncology Follow Up Visit  [name] [id number] 03-12-1918 78 y.o. 09/09/12  DIAGNOSIS:   Encounter Diagnosis  Name Primary?  . 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   PAST THERAPY:  As Above  Interim History:  Pt has a hx of chronic pain syndrome which continues to be an issue for her. She is doing well from a breast cancer point of view.  Medications: I have reviewed the patient's current medications.  Allergies:  Allergies  Allergen Reactions  . Diclofenac Sodium   . Procaine Hcl   . Sulfa Antibiotics   . Vicodin (Hydrocodone-Acetaminophen)     Past Medical History, Surgical history, Social history, and Family History were reviewed and updated.  Review of Systems: Constitutional:  Negative for fever, chills, night sweats, anorexia, weight loss, pain. Cardiovascular: no chest pain or dyspnea on exertion Respiratory: no cough, shortness of breath, or wheezing Neurological: no TIA or stroke symptoms Dermatological: negative ENT: negative Skin Gastrointestinal: negative Genito-Urinary: negative Hematological and Lymphatic: negative Breast: negative for breast lumps negative Musculoskeletal: negative Remaining ROS negative.  Physical Exam:  Blood pressure 138/65, pulse 93, temperature 98.5 F (36.9 C), temperature source Oral, resp. rate 18, height 5\' 1"  (1.549 m), weight 129 lb 1.6 oz (58.559 kg).  ECOG: 1   HEENT:  Sclerae anicteric, conjunctivae pink.  Oropharynx clear.  No mucositis or candidiasis.  Nodes:  No cervical, supraclavicular, or axillary lymphadenopathy palpated.  Breast Exam:  Right breast is s/p lumpectomy .  No masses, discharge, skin change, or nipple inversion.  Left breast is benign.  No masses, discharge, skin change, or nipple inversion..  Lungs:  Clear to auscultation bilaterally.  No crackles, rhonchi, or wheezes.  Heart:  Regular rate and  rhythm.  Abdomen:  Soft, nontender.  Positive bowel sounds.  No organomegaly or masses palpated.  Musculoskeletal:  No focal spinal tenderness to palpation.  Extremities:  Benign.  No peripheral edema or cyanosis.  Skin:  Benign.  Neuro:  Nonfocal.    Lab Results: Lab Results  Component Value Date   WBC 7.0 09/09/2012   HGB 11.5* 09/09/2012   HCT 33.6* 09/09/2012   MCV 93.0 09/09/2012   PLT 202 09/09/2012     Chemistry      Component Value Date/Time   NA 141 09/09/2012 1319   NA 141 11/16/2011 1341   K 3.9 09/09/2012 1319   K 4.3 11/16/2011 1341   CL 109* 09/09/2012 1319   CL 106 11/16/2011 1341   CO2 26 09/09/2012 1319   CO2 24 11/16/2011 1341   BUN 19.0 09/09/2012 1319   BUN 17 11/16/2011 1341   CREATININE 1.0 09/09/2012 1319   CREATININE 1.12* 11/16/2011 1341      Component Value Date/Time   CALCIUM 9.4 09/09/2012 1319   CALCIUM 9.1 11/16/2011 1341   ALKPHOS 83 09/09/2012 1319   ALKPHOS 82 11/16/2011 1341   AST 20 09/09/2012 1319   AST 19 11/16/2011 1341   ALT 11 09/09/2012 1319   ALT 15 11/16/2011 1341   BILITOT 0.48 09/09/2012 1319   BILITOT 0.6 11/16/2011 1341       Radiological Studies:  Mm Digital Diagnostic Bilat  09/25/2012  ** RADIOLOGY REPORT **  Clinical Data:  78 year old female for annual bilateral mammograms with history of right breast cancer and lumpectomy in 2010.  DIGITAL DIAGNOSTIC BILATERAL MAMMOGRAM with CAD  Comparison: 05/07/2011 and prior mammograms  dating back to 04/28/2009.  Findings: Breast density:  ACR Category 2: There is a scattered fibroglandular pattern.  MLO and CC views of both breasts and a spot tangential view of the lumpectomy site performed.  Scarring in the lower right breast again identified.  There is no evidence of mass, nonsurgical distortion, or suspicious calcifications.  Mammographic images were processed with CAD.  IMPRESSION: No specific mammographic evidence of malignancy.  Right breast scarring.  These findings were discussed  with the patient.  She was encouraged to begin/continue monthly self exams and to contact her primary physician if any changes noted.  BI-RADS CATEGORY 2:  Benign finding(s).  Recommend bilateral diagnostic mammograms in 1 year.   Original Report Authenticated By: Harmon Pier, M.D.      IMPRESSIONS AND PLAN: A 78 y.o. female with   A hx of er+ breast cancer s/p lumpectomy and no radiation. She has multiple comorbid problems. She is overdue for a mammogram and we will schedule for this. She is tolerating her AI and she will continue this as well as vitamin D. She will have a f/u bone density and will be seen in 6 months.  Spent more than half the time coordinating care, as well as discussion of BMI and its implications.      [name] 12/15/20138:43 PM Cell [phone]

## 2012-10-23 ENCOUNTER — Other Ambulatory Visit: Payer: Self-pay | Admitting: Oncology

## 2012-11-20 NOTE — Progress Notes (Signed)
  CC: [name], M.D. Valerie Gosling, MD [name], M.D., Ph.D. [name]. [name], M.D.   PROBLEM:   1. Node-negative, ER/PR positive breast cancer status post lumpectomy 06/17/2009, on Femara. 2. History of chronic pain.  HISTORY:  [name] returns for followup.  She is here with her husband.  Overall doing fairly well, though she has been having a lot more problems with hot flashes, difficulty sleeping likely with the Femara.  She has had a recent mammogram which was normal.  She is not due for a bone density test until next year.  Other than that, she is doing fairly well, as noted.  Her medication list was reviewed.  She is on a large number of medications which have not changed.  Of note is she is on Neurontin.  PHYSICAL EXAMINATION:  General:  Pleasant, frail woman looking her stated age.  Vital Signs:  Blood pressure 193/66, temperature 98, pulse 90, respiratory rate 20.  Weight is 127.1.  Head and neck area:  She has no palpable adenopathy.  Oropharynx normal.  Lungs:  Clear.  Heart:  Sounds are normal.  Breasts:  Free of any obvious masses.  She is status post lumpectomy with well-healed surgical scar.  There is no nipple retraction.  Abdomen:  Soft.  No palpable hepatosplenomegaly.  No inguinal adenopathy.  Extremities:  No peripheral cyanosis, clubbing or edema.  LABORATORY DATA:  CBC today showed hemoglobin 10.0, MCV of 90.9.  Vitamin D is 35.  IMPRESSION AND PLAN:  [name] is doing fairly well.  I have recommend she try Peridin-C, and we will have scripts available to her for Megace in case Peridin-C does not work out.  I have recommended she take additional vitamin D.  I will bring her back in 6 weeks to check her anemia panel.

## 2012-12-11 ENCOUNTER — Encounter: Payer: Self-pay | Admitting: Oncology

## 2012-12-11 ENCOUNTER — Telehealth: Payer: Self-pay | Admitting: *Deleted

## 2012-12-11 NOTE — Telephone Encounter (Signed)
Confirmed 12/25/12 appt w/ pt's husband.  Mailed letter & calendar to pt w/ packet to pt. Took paperwork to Med Rec for chart.

## 2012-12-25 ENCOUNTER — Ambulatory Visit (HOSPITAL_BASED_OUTPATIENT_CLINIC_OR_DEPARTMENT_OTHER): Payer: Medicare Other | Admitting: Oncology

## 2012-12-25 ENCOUNTER — Other Ambulatory Visit (HOSPITAL_BASED_OUTPATIENT_CLINIC_OR_DEPARTMENT_OTHER): Payer: Medicare Other | Admitting: Lab

## 2012-12-25 ENCOUNTER — Telehealth: Payer: Self-pay | Admitting: Oncology

## 2012-12-25 VITALS — BP 163/74 | HR 81 | Temp 98.1°F | Resp 20 | Ht 61.0 in | Wt 124.0 lb

## 2012-12-25 DIAGNOSIS — R5381 Other malaise: Secondary | ICD-10-CM

## 2012-12-25 DIAGNOSIS — Z170 Estrogen receptor positive status [ER+]: Secondary | ICD-10-CM

## 2012-12-25 DIAGNOSIS — C50519 Malignant neoplasm of lower-outer quadrant of unspecified female breast: Secondary | ICD-10-CM

## 2012-12-25 DIAGNOSIS — C50919 Malignant neoplasm of unspecified site of unspecified female breast: Secondary | ICD-10-CM

## 2012-12-25 LAB — CBC WITH DIFFERENTIAL/PLATELET
BASO%: 0.8 % (ref 0.0–2.0)
Eosinophils Absolute: 0.3 10*3/uL (ref 0.0–0.5)
MONO#: 0.8 10*3/uL (ref 0.1–0.9)
MONO%: 10.6 % (ref 0.0–14.0)
NEUT#: 4.8 10*3/uL (ref 1.5–6.5)
RBC: 3.83 10*6/uL (ref 3.70–5.45)
RDW: 14.1 % (ref 11.2–14.5)
WBC: 7.2 10*3/uL (ref 3.9–10.3)

## 2012-12-25 LAB — COMPREHENSIVE METABOLIC PANEL (CC13)
ALT: 13 U/L (ref 0–55)
AST: 22 U/L (ref 5–34)
CO2: 27 mEq/L (ref 22–29)
Chloride: 104 mEq/L (ref 98–107)
Sodium: 141 mEq/L (ref 136–145)
Total Bilirubin: 0.54 mg/dL (ref 0.20–1.20)
Total Protein: 7 g/dL (ref 6.4–8.3)

## 2012-12-25 NOTE — Telephone Encounter (Signed)
gv pt appt schedule for September.  °

## 2012-12-26 LAB — VITAMIN D 25 HYDROXY (VIT D DEFICIENCY, FRACTURES): Vit D, 25-Hydroxy: 22 ng/mL — ABNORMAL LOW (ref 30–89)

## 2013-01-05 NOTE — Progress Notes (Signed)
OFFICE PROGRESS NOTE  CC Dr. Daleen Bo Avva Dr. Dorothy Puffer Dr. Emelia Loron  DIAGNOSIS: 79 year old female with invasive ductal carcinoma with mucinous features originally diagnosed in July 2010.  PRIOR THERAPY:  #1 patient underwent a screening mammogram that showed a right breast cancer in July 2010. The tumor was ER positive PR positive HER-2/neu negative.  #2 06/17/2009 patient underwent a right lumpectomy with the final pathology revealing a invasive mucinous ductal carcinoma stage I.  #3 patient then received adjuvant radiation therapy from 08/04/2009 to 08/31/2009.  #4 patient then went on to receive letrozole 2.5 mg daily starting in November 2010. Since then she has been followed every 6 months. She is up-to-date on her mammograms.  CURRENT THERAPY:letrozole 2.5 mg daily  INTERVAL HISTORY: [name] 79 y.o. female returns for followup visit to establish her oncologic care with me. She prior to my this visit was seen Dr. Pierce Crane. Clinically she seems to be doing well she denies any fevers chills night sweats. She has no headaches. She is weak and tired and fatigued. She does have a poor appetite. Apparently Dr. Donnie Coffin had put her on some appetite stimulants but she did not take those opted now. Otherwise she is doing well and she does tell me even without the appetite stimulant she is gaining weight. She is eating better.  MEDICAL HISTORY:No past medical history on file.  ALLERGIES:  is allergic to diclofenac sodium; procaine hcl; sulfa antibiotics; and vicodin.  MEDICATIONS:  Current Outpatient Prescriptions  Medication Sig Dispense Refill  . BENICAR 20 MG tablet       . clobetasol (TEMOVATE) 0.05 % external solution       . cyclobenzaprine (FLEXERIL) 10 MG tablet Take 10 mg by mouth 3 (three) times daily as needed.      . cycloSPORINE (RESTASIS) 0.05 % ophthalmic emulsion Place 1 drop into both eyes 2 (two) times daily.      Marland Kitchen desonide (DESOWEN) 0.05 % ointment        . fentaNYL (DURAGESIC - DOSED MCG/HR) 50 MCG/HR Place 1 patch onto the skin every 3 (three) days.      . fluticasone (FLONASE) 50 MCG/ACT nasal spray Place 2 sprays into the nose daily.      . hyoscyamine (ANASPAZ) 0.125 MG TBDP Place 0.125 mg under the tongue.      . lansoprazole (PREVACID) 30 MG capsule Take 30 mg by mouth daily.      Marland Kitchen letrozole (FEMARA) 2.5 MG tablet Take 1 tablet (2.5 mg total) by mouth daily.  90 tablet  4  . levothyroxine (SYNTHROID, LEVOTHROID) 88 MCG tablet Take 88 mcg by mouth daily.      . meclizine (ANTIVERT) 25 MG tablet Take 25 mg by mouth 3 (three) times daily as needed.      Marland Kitchen oxyCODONE (ROXICODONE) 15 MG immediate release tablet Take 10 mg by mouth 2 (two) times daily.      . solifenacin (VESICARE) 5 MG tablet Take 5 mg by mouth daily.      Marland Kitchen escitalopram (LEXAPRO) 5 MG tablet       . fluocinonide (LIDEX) 0.05 % external solution       . gabapentin (NEURONTIN) 100 MG capsule Take 100 mg by mouth 4 (four) times daily.      . hydroxychloroquine (PLAQUENIL) 200 MG tablet Take 200 mg by mouth daily.       No current facility-administered medications for this visit.    SURGICAL HISTORY: No past surgical history  on file.  REVIEW OF SYSTEMS:  Pertinent items are noted in HPI.   HEALTH MAINTENANCE:  PHYSICAL EXAMINATION: Blood pressure 163/74, pulse 81, temperature 98.1 F (36.7 C), temperature source Oral, resp. rate 20, height 5\' 1"  (1.549 m), weight 124 lb (56.246 kg). Body mass index is 23.44 kg/(m^2). ECOG PERFORMANCE STATUS: 1 - Symptomatic but completely ambulatory   General appearance: alert, cooperative and appears stated age Resp: clear to auscultation bilaterally Back: symmetric, no curvature. ROM normal. No CVA tenderness. Cardio: regular rate and rhythm GI: soft, non-tender; bowel sounds normal; no masses,  no organomegaly Genitalia: defer exam Neurologic: Grossly normal  Right breast reveals very well-healed surgical scar no nipple  discharge right breast no masses or nipple discharge.2 LABORATORY DATA: Lab Results  Component Value Date   WBC 7.2 12/25/2012   HGB 11.3* 12/25/2012   HCT 34.3* 12/25/2012   MCV 89.6 12/25/2012   PLT 224 12/25/2012      Chemistry      Component Value Date/Time   NA 141 12/25/2012 1244   NA 141 11/16/2011 1341   K 4.0 12/25/2012 1244   K 4.3 11/16/2011 1341   CL 104 12/25/2012 1244   CL 106 11/16/2011 1341   CO2 27 12/25/2012 1244   CO2 24 11/16/2011 1341   BUN 13.5 12/25/2012 1244   BUN 17 11/16/2011 1341   CREATININE 1.0 12/25/2012 1244   CREATININE 1.12* 11/16/2011 1341      Component Value Date/Time   CALCIUM 9.4 12/25/2012 1244   CALCIUM 9.1 11/16/2011 1341   ALKPHOS 80 12/25/2012 1244   ALKPHOS 82 11/16/2011 1341   AST 22 12/25/2012 1244   AST 19 11/16/2011 1341   ALT 13 12/25/2012 1244   ALT 15 11/16/2011 1341   BILITOT 0.54 12/25/2012 1244   BILITOT 0.6 11/16/2011 1341       RADIOGRAPHIC STUDIES:  No results found.  ASSESSMENT: 79 year old female with history of invasive ductal carcinoma with mucinous features originally diagnosed in July 2010 tumor was ER/PR positive HER-2/neu negative. It 06/18/1999 and she underwent a right lumpectomy. This was then followed by radiation therapy from 08/04/2009 to 08/31/2009. She then received adjuvant letrozole. She is tolerating it well. She will complete a total of 5 years in 2015. All questions were answered today. She has no evidence of recurrent disease.   PLAN:   #1 continue letrozole 2.5 mg daily.  #2 I will plan on seeing her back in 6 months time.   All questions were answered. The patient knows to call the clinic with any problems, questions or concerns. We can certainly see the patient much sooner if necessary.  I spent 40 minutes counseling the patient face to face. The total time spent in the appointment was 40 minutes.    Drue Second, MD Medical/Oncology [facility] [phone] (beeper) [phone] (Office)

## 2013-01-19 ENCOUNTER — Other Ambulatory Visit: Payer: Self-pay

## 2013-03-12 ENCOUNTER — Other Ambulatory Visit: Payer: Self-pay | Admitting: Rheumatology

## 2013-03-12 DIAGNOSIS — Z853 Personal history of malignant neoplasm of breast: Secondary | ICD-10-CM

## 2013-03-12 DIAGNOSIS — N644 Mastodynia: Secondary | ICD-10-CM

## 2013-03-13 ENCOUNTER — Other Ambulatory Visit: Payer: Self-pay | Admitting: Emergency Medicine

## 2013-03-18 ENCOUNTER — Ambulatory Visit
Admission: RE | Admit: 2013-03-18 | Discharge: 2013-03-18 | Disposition: A | Discharge status: Home / Self Care | Payer: Medicare Other | Source: Ambulatory Visit | Attending: Rheumatology | Admitting: Rheumatology

## 2013-03-18 DIAGNOSIS — N644 Mastodynia: Secondary | ICD-10-CM

## 2013-03-18 DIAGNOSIS — Z853 Personal history of malignant neoplasm of breast: Secondary | ICD-10-CM

## 2013-03-19 ENCOUNTER — Other Ambulatory Visit: Payer: Self-pay | Admitting: Internal Medicine

## 2013-03-19 DIAGNOSIS — S0093XA Contusion of unspecified part of head, initial encounter: Secondary | ICD-10-CM

## 2013-03-20 ENCOUNTER — Ambulatory Visit
Admission: RE | Admit: 2013-03-20 | Discharge: 2013-03-20 | Disposition: A | Discharge status: Home / Self Care | Payer: Medicare Other | Source: Ambulatory Visit | Attending: Internal Medicine | Admitting: Internal Medicine

## 2013-03-20 DIAGNOSIS — S0093XA Contusion of unspecified part of head, initial encounter: Secondary | ICD-10-CM

## 2013-04-15 ENCOUNTER — Telehealth: Payer: Self-pay | Admitting: Medical Oncology

## 2013-04-15 NOTE — Telephone Encounter (Addendum)
Dr Stacey Drain (Rheumatologist) called re patient would like to speak to Dr Welton Flakes re a "clinical situation", states will be available to speak 04/16/13 @ [phone]  Mssg forwarded to MD  L.O.V. 03/06 with MD. Patient with upcoming sched appts 07/02/13 lab/MD

## 2013-05-06 ENCOUNTER — Other Ambulatory Visit: Payer: Self-pay | Admitting: Emergency Medicine

## 2013-05-08 ENCOUNTER — Telehealth: Payer: Self-pay | Admitting: Oncology

## 2013-05-08 NOTE — Telephone Encounter (Signed)
S/w pt re appt for 8/5 @ 11am. Mailed schedule.

## 2013-05-26 ENCOUNTER — Ambulatory Visit (HOSPITAL_BASED_OUTPATIENT_CLINIC_OR_DEPARTMENT_OTHER): Payer: Medicare Other | Admitting: Lab

## 2013-05-26 ENCOUNTER — Ambulatory Visit (HOSPITAL_BASED_OUTPATIENT_CLINIC_OR_DEPARTMENT_OTHER): Payer: Medicare Other | Admitting: Oncology

## 2013-05-26 ENCOUNTER — Encounter: Payer: Self-pay | Admitting: Oncology

## 2013-05-26 ENCOUNTER — Telehealth: Payer: Self-pay | Admitting: Oncology

## 2013-05-26 VITALS — BP 128/43 | HR 79 | Temp 97.7°F | Resp 20

## 2013-05-26 DIAGNOSIS — C50911 Malignant neoplasm of unspecified site of right female breast: Secondary | ICD-10-CM

## 2013-05-26 DIAGNOSIS — C50519 Malignant neoplasm of lower-outer quadrant of unspecified female breast: Secondary | ICD-10-CM

## 2013-05-26 DIAGNOSIS — R21 Rash and other nonspecific skin eruption: Secondary | ICD-10-CM

## 2013-05-26 LAB — CBC WITH DIFFERENTIAL/PLATELET
BASO%: 0.7 % (ref 0.0–2.0)
EOS%: 4.9 % (ref 0.0–7.0)
Eosinophils Absolute: 0.3 10*3/uL (ref 0.0–0.5)
LYMPH%: 15.2 % (ref 14.0–49.7)
MCH: 29.7 pg (ref 25.1–34.0)
MCHC: 32.7 g/dL (ref 31.5–36.0)
MCV: 90.7 fL (ref 79.5–101.0)
MONO%: 9.4 % (ref 0.0–14.0)
NEUT#: 4.3 10*3/uL (ref 1.5–6.5)
Platelets: 228 10*3/uL (ref 145–400)
RBC: 3.73 10*6/uL (ref 3.70–5.45)
RDW: 14.7 % — ABNORMAL HIGH (ref 11.2–14.5)

## 2013-05-26 LAB — COMPREHENSIVE METABOLIC PANEL (CC13)
Calcium: 9.2 mg/dL (ref 8.4–10.4)
Creatinine: 1.3 mg/dL — ABNORMAL HIGH (ref 0.6–1.1)
Potassium: 4.9 mEq/L (ref 3.5–5.1)
Sodium: 140 mEq/L (ref 136–145)

## 2013-05-26 NOTE — Telephone Encounter (Signed)
, °

## 2013-05-28 LAB — CANCER ANTIGEN 27.29: CA 27.29: 29 U/mL (ref 0–39)

## 2013-06-03 ENCOUNTER — Encounter (HOSPITAL_COMMUNITY)
Admission: RE | Admit: 2013-06-03 | Discharge: 2013-06-03 | Disposition: A | Discharge status: Home / Self Care | Payer: Medicare Other | Source: Ambulatory Visit | Attending: Oncology | Admitting: Oncology

## 2013-06-03 ENCOUNTER — Other Ambulatory Visit: Payer: Self-pay | Admitting: Oncology

## 2013-06-03 ENCOUNTER — Encounter (HOSPITAL_COMMUNITY): Payer: Medicare Other

## 2013-06-03 ENCOUNTER — Ambulatory Visit (HOSPITAL_COMMUNITY)
Admission: RE | Admit: 2013-06-03 | Discharge: 2013-06-03 | Disposition: A | Discharge status: Home / Self Care | Payer: Medicare Other | Source: Ambulatory Visit | Attending: Oncology | Admitting: Oncology

## 2013-06-03 DIAGNOSIS — C50911 Malignant neoplasm of unspecified site of right female breast: Secondary | ICD-10-CM

## 2013-06-03 DIAGNOSIS — K838 Other specified diseases of biliary tract: Secondary | ICD-10-CM | POA: Insufficient documentation

## 2013-06-03 DIAGNOSIS — Z923 Personal history of irradiation: Secondary | ICD-10-CM | POA: Insufficient documentation

## 2013-06-03 DIAGNOSIS — C50919 Malignant neoplasm of unspecified site of unspecified female breast: Secondary | ICD-10-CM | POA: Insufficient documentation

## 2013-06-03 DIAGNOSIS — Z9089 Acquired absence of other organs: Secondary | ICD-10-CM | POA: Insufficient documentation

## 2013-06-03 DIAGNOSIS — M439 Deforming dorsopathy, unspecified: Secondary | ICD-10-CM | POA: Insufficient documentation

## 2013-06-03 DIAGNOSIS — K5730 Diverticulosis of large intestine without perforation or abscess without bleeding: Secondary | ICD-10-CM | POA: Insufficient documentation

## 2013-06-03 DIAGNOSIS — I700 Atherosclerosis of aorta: Secondary | ICD-10-CM | POA: Insufficient documentation

## 2013-06-03 DIAGNOSIS — M47817 Spondylosis without myelopathy or radiculopathy, lumbosacral region: Secondary | ICD-10-CM | POA: Insufficient documentation

## 2013-06-03 DIAGNOSIS — Z90710 Acquired absence of both cervix and uterus: Secondary | ICD-10-CM | POA: Insufficient documentation

## 2013-06-03 DIAGNOSIS — I2510 Atherosclerotic heart disease of native coronary artery without angina pectoris: Secondary | ICD-10-CM | POA: Insufficient documentation

## 2013-06-03 MED ORDER — TECHNETIUM TC 99M MEDRONATE IV KIT
26.5000 | PACK | Freq: Once | INTRAVENOUS | Status: AC | PRN
  Administered 2013-06-03: 26.5 via INTRAVENOUS

## 2013-06-03 MED ORDER — IOHEXOL 300 MG/ML  SOLN
100.0000 mL | Freq: Once | INTRAMUSCULAR | Status: AC | PRN
  Administered 2013-06-03: 100 mL via INTRAVENOUS

## 2013-06-14 NOTE — Progress Notes (Signed)
OFFICE PROGRESS NOTE  CC Dr. Daleen Bo Avva Dr. Dorothy Puffer Dr. Emelia Loron  DIAGNOSIS: 79 year old female with invasive ductal carcinoma with mucinous features originally diagnosed in July 2010.  PRIOR THERAPY:  #1 patient underwent a screening mammogram that showed a right breast cancer in July 2010. The tumor was ER positive PR positive HER-2/neu negative.  #2 06/17/2009 patient underwent a right lumpectomy with the final pathology revealing a invasive mucinous ductal carcinoma stage I.  #3 patient then received adjuvant radiation therapy from 08/04/2009 to 08/31/2009.  #4 patient then went on to receive letrozole 2.5 mg daily starting in November 2010. Since then she has been followed every 6 months. She is up-to-date on her mammograms.  CURRENT THERAPY:letrozole 2.5 mg daily  INTERVAL HISTORY: [name] 79 y.o. female returns for patient was recently seen by her dermatologist tonight noted a recurrence of rash. Possibly prompting a recurrence of her breast cancer. Because of this she is seen. We extensively discussed the rash.However the patient and her husband are very fearful that the cancer has recurred and therefore we should proceed with further workup.   MEDICAL HISTORY:History reviewed. No pertinent past medical history.  ALLERGIES:  is allergic to diclofenac sodium; procaine hcl; sulfa antibiotics; vicodin; novocain; and other.  MEDICATIONS:  Current Outpatient Prescriptions  Medication Sig Dispense Refill  . aspirin 81 MG tablet Take 81 mg by mouth daily.      Marland Kitchen BENICAR 20 MG tablet       . clotrimazole (LOTRIMIN) 1 % cream Apply topically 2 (two) times daily.      . cyclobenzaprine (FLEXERIL) 10 MG tablet Take 10 mg by mouth 3 (three) times daily as needed.      . cycloSPORINE (RESTASIS) 0.05 % ophthalmic emulsion Place 1 drop into both eyes 2 (two) times daily.      Marland Kitchen desonide (DESOWEN) 0.05 % ointment       . escitalopram (LEXAPRO) 5 MG tablet       .  fentaNYL (DURAGESIC - DOSED MCG/HR) 50 MCG/HR Place 1 patch onto the skin every 3 (three) days.      . fluticasone (FLONASE) 50 MCG/ACT nasal spray Place 2 sprays into the nose daily.      . hyoscyamine (ANASPAZ) 0.125 MG TBDP Place 0.125 mg under the tongue.      . lansoprazole (PREVACID) 30 MG capsule Take 30 mg by mouth daily.      Marland Kitchen levothyroxine (SYNTHROID, LEVOTHROID) 88 MCG tablet Take 88 mcg by mouth daily.      . meclizine (ANTIVERT) 25 MG tablet Take 25 mg by mouth 3 (three) times daily as needed.      Marland Kitchen oxyCODONE (ROXICODONE) 15 MG immediate release tablet Take 10 mg by mouth 2 (two) times daily.      . solifenacin (VESICARE) 5 MG tablet Take 5 mg by mouth daily.      . clobetasol (TEMOVATE) 0.05 % external solution       . fluocinonide (LIDEX) 0.05 % external solution       . gabapentin (NEURONTIN) 100 MG capsule Take 100 mg by mouth 4 (four) times daily.      . hydroxychloroquine (PLAQUENIL) 200 MG tablet Take 200 mg by mouth daily.      Marland Kitchen letrozole (FEMARA) 2.5 MG tablet Take 1 tablet (2.5 mg total) by mouth daily.  90 tablet  4   No current facility-administered medications for this visit.    SURGICAL HISTORY: History reviewed. No pertinent past surgical  history.  REVIEW OF SYSTEMS:  Pertinent items are noted in HPI.   HEALTH MAINTENANCE:  PHYSICAL EXAMINATION: Blood pressure 128/43, pulse 79, temperature 97.7 F (36.5 C), temperature source Oral, resp. rate 20. There is no weight on file to calculate BMI. ECOG PERFORMANCE STATUS: 1 - Symptomatic but completely ambulatory   General appearance: alert, cooperative and appears stated age Resp: clear to auscultation bilaterally Back: symmetric, no curvature. ROM normal. No CVA tenderness. Cardio: regular rate and rhythm GI: soft, non-tender; bowel sounds normal; no masses,  no organomegaly Genitalia: defer exam Neurologic: Grossly normal  Right breast reveals very well-healed surgical scar no nipple discharge right  breast no masses or nipple discharge.2 LABORATORY DATA: Lab Results  Component Value Date   WBC 6.2 05/26/2013   HGB 11.1* 05/26/2013   HCT 33.9* 05/26/2013   MCV 90.7 05/26/2013   PLT 228 05/26/2013      Chemistry      Component Value Date/Time   NA 140 05/26/2013 1241   NA 141 11/16/2011 1341   K 4.9 05/26/2013 1241   K 4.3 11/16/2011 1341   CL 104 12/25/2012 1244   CL 106 11/16/2011 1341   CO2 26 05/26/2013 1241   CO2 24 11/16/2011 1341   BUN 21.1 05/26/2013 1241   BUN 17 11/16/2011 1341   CREATININE 1.3* 05/26/2013 1241   CREATININE 1.12* 11/16/2011 1341      Component Value Date/Time   CALCIUM 9.2 05/26/2013 1241   CALCIUM 9.1 11/16/2011 1341   ALKPHOS 87 05/26/2013 1241   ALKPHOS 82 11/16/2011 1341   AST 29 05/26/2013 1241   AST 19 11/16/2011 1341   ALT 13 05/26/2013 1241   ALT 15 11/16/2011 1341   BILITOT 0.57 05/26/2013 1241   BILITOT 0.6 11/16/2011 1341       RADIOGRAPHIC STUDIES:  No results found.  ASSESSMENT: 79 year old female with history of invasive ductal carcinoma with mucinous features originally diagnosed in July 2010 tumor was ER/PR positive HER-2/neu negative. It 06/18/1999 and she underwent a right lumpectomy. This was then followed by radiation therapy from 08/04/2009 to 08/31/2009. She then received adjuvant letrozole. She is tolerating it well. She will complete a total of 5 years in 2015. All questions were answered today.   #2 patient now with a rash unclear etiology. We will proceed with staging studies including CT chest abdomen pelvis mammograms.   PLAN:   #1 continue letrozole 2.5 mg daily.  #2 Restaging scans for possible recurrent breast cancer  #3 follow up in 2 -3 months   All questions were answered. The patient knows to call the clinic with any problems, questions or concerns. We can certainly see the patient much sooner if necessary.  I spent 25 minutes counseling the patient face to face. The total time spent in the appointment was 30 minutes.    Drue Second, MD Medical/Oncology [facility] [phone] (beeper) [phone] (Office)

## 2013-07-02 ENCOUNTER — Other Ambulatory Visit (HOSPITAL_BASED_OUTPATIENT_CLINIC_OR_DEPARTMENT_OTHER): Payer: Medicare Other | Admitting: Lab

## 2013-07-02 ENCOUNTER — Telehealth: Payer: Self-pay | Admitting: Oncology

## 2013-07-02 ENCOUNTER — Ambulatory Visit (HOSPITAL_BASED_OUTPATIENT_CLINIC_OR_DEPARTMENT_OTHER): Payer: Medicare Other | Admitting: Oncology

## 2013-07-02 DIAGNOSIS — E559 Vitamin D deficiency, unspecified: Secondary | ICD-10-CM

## 2013-07-02 DIAGNOSIS — C50519 Malignant neoplasm of lower-outer quadrant of unspecified female breast: Secondary | ICD-10-CM

## 2013-07-02 DIAGNOSIS — C50919 Malignant neoplasm of unspecified site of unspecified female breast: Secondary | ICD-10-CM

## 2013-07-02 DIAGNOSIS — C50911 Malignant neoplasm of unspecified site of right female breast: Secondary | ICD-10-CM

## 2013-07-02 LAB — CBC WITH DIFFERENTIAL/PLATELET
Basophils Absolute: 0.1 10*3/uL (ref 0.0–0.1)
Eosinophils Absolute: 0.3 10*3/uL (ref 0.0–0.5)
HGB: 11.4 g/dL — ABNORMAL LOW (ref 11.6–15.9)
LYMPH%: 21.3 % (ref 14.0–49.7)
MCV: 91.3 fL (ref 79.5–101.0)
MONO%: 9.3 % (ref 0.0–14.0)
NEUT#: 4.2 10*3/uL (ref 1.5–6.5)
NEUT%: 63.1 % (ref 38.4–76.8)
Platelets: 210 10*3/uL (ref 145–400)
RBC: 3.79 10*6/uL (ref 3.70–5.45)

## 2013-07-02 LAB — COMPREHENSIVE METABOLIC PANEL (CC13)
Alkaline Phosphatase: 78 U/L (ref 40–150)
BUN: 15.3 mg/dL (ref 7.0–26.0)
Creatinine: 1 mg/dL (ref 0.6–1.1)
Glucose: 97 mg/dl (ref 70–140)
Total Bilirubin: 0.54 mg/dL (ref 0.20–1.20)

## 2013-07-02 NOTE — Telephone Encounter (Signed)
, °

## 2013-07-12 NOTE — Progress Notes (Signed)
OFFICE PROGRESS NOTE  CC Dr. Daleen Bo Avva Dr. Dorothy Puffer Dr. Emelia Loron  DIAGNOSIS: 79 year old female with invasive ductal carcinoma with mucinous features originally diagnosed in July 2010.  PRIOR THERAPY:  #1 patient underwent a screening mammogram that showed a right breast cancer in July 2010. The tumor was ER positive PR positive HER-2/neu negative.  #2 06/17/2009 patient underwent a right lumpectomy with the final pathology revealing a invasive mucinous ductal carcinoma stage I.  #3 patient then received adjuvant radiation therapy from 08/04/2009 to 08/31/2009.  #4 patient then went on to receive letrozole 2.5 mg daily starting in November 2010. Since then she has been followed every 6 months. She is up-to-date on her mammograms.  CURRENT THERAPY:letrozole 2.5 mg daily  INTERVAL HISTORY: Gearlene Godsil [place] 79 y.o. female returns forfollowup to discuss results of her scans. I went over it her scans in detail. Patient does not have recurrence of her breast cancer. I am not able to explain why she is developing rashes. I have recommended that she be seen back by dermatologist. Patient did have a multiple other complaints that I addressed.  MEDICAL HISTORY:No past medical history on file.  ALLERGIES:  is allergic to diclofenac sodium; procaine hcl; sulfa antibiotics; vicodin; novocain; and other.  MEDICATIONS:  Current Outpatient Prescriptions  Medication Sig Dispense Refill  . aspirin 81 MG tablet Take 81 mg by mouth daily.      Marland Kitchen BENICAR 20 MG tablet       . clobetasol (TEMOVATE) 0.05 % external solution       . clotrimazole (LOTRIMIN) 1 % cream Apply topically 2 (two) times daily.      . cyclobenzaprine (FLEXERIL) 10 MG tablet Take 10 mg by mouth 3 (three) times daily as needed.      . cycloSPORINE (RESTASIS) 0.05 % ophthalmic emulsion Place 1 drop into both eyes 2 (two) times daily.      Marland Kitchen desonide (DESOWEN) 0.05 % ointment       . escitalopram (LEXAPRO) 5 MG tablet        . fentaNYL (DURAGESIC - DOSED MCG/HR) 50 MCG/HR Place 1 patch onto the skin every 3 (three) days.      . fluocinonide (LIDEX) 0.05 % external solution       . fluticasone (FLONASE) 50 MCG/ACT nasal spray Place 2 sprays into the nose daily.      Marland Kitchen gabapentin (NEURONTIN) 100 MG capsule Take 100 mg by mouth 4 (four) times daily.      . hydroxychloroquine (PLAQUENIL) 200 MG tablet Take 200 mg by mouth daily.      . hyoscyamine (ANASPAZ) 0.125 MG TBDP Place 0.125 mg under the tongue.      . lansoprazole (PREVACID) 30 MG capsule Take 30 mg by mouth daily.      Marland Kitchen letrozole (FEMARA) 2.5 MG tablet Take 1 tablet (2.5 mg total) by mouth daily.  90 tablet  4  . levothyroxine (SYNTHROID, LEVOTHROID) 88 MCG tablet Take 88 mcg by mouth daily.      . meclizine (ANTIVERT) 25 MG tablet Take 25 mg by mouth 3 (three) times daily as needed.      Marland Kitchen oxyCODONE (ROXICODONE) 15 MG immediate release tablet Take 10 mg by mouth 2 (two) times daily.      . solifenacin (VESICARE) 5 MG tablet Take 5 mg by mouth daily.       No current facility-administered medications for this visit.    SURGICAL HISTORY: No past surgical history on file.  REVIEW OF SYSTEMS:  Pertinent items are noted in HPI.   HEALTH MAINTENANCE:  PHYSICAL EXAMINATION: There were no vitals taken for this visit. There is no weight on file to calculate BMI. ECOG PERFORMANCE STATUS: 1 - Symptomatic but completely ambulatory   General appearance: alert, cooperative and appears stated age Resp: clear to auscultation bilaterally Back: symmetric, no curvature. ROM normal. No CVA tenderness. Cardio: regular rate and rhythm GI: soft, non-tender; bowel sounds normal; no masses,  no organomegaly Genitalia: defer exam Neurologic: Grossly normal  Right breast reveals very well-healed surgical scar no nipple discharge right breast no masses or nipple discharge.2 LABORATORY DATA: Lab Results  Component Value Date   WBC 6.6 07/02/2013   HGB 11.4*  07/02/2013   HCT 34.6* 07/02/2013   MCV 91.3 07/02/2013   PLT 210 07/02/2013      Chemistry      Component Value Date/Time   NA 143 07/02/2013 1058   NA 141 11/16/2011 1341   K 4.4 07/02/2013 1058   K 4.3 11/16/2011 1341   CL 104 12/25/2012 1244   CL 106 11/16/2011 1341   CO2 30* 07/02/2013 1058   CO2 24 11/16/2011 1341   BUN 15.3 07/02/2013 1058   BUN 17 11/16/2011 1341   CREATININE 1.0 07/02/2013 1058   CREATININE 1.12* 11/16/2011 1341      Component Value Date/Time   CALCIUM 9.2 07/02/2013 1058   CALCIUM 9.1 11/16/2011 1341   ALKPHOS 78 07/02/2013 1058   ALKPHOS 82 11/16/2011 1341   AST 26 07/02/2013 1058   AST 19 11/16/2011 1341   ALT 15 07/02/2013 1058   ALT 15 11/16/2011 1341   BILITOT 0.54 07/02/2013 1058   BILITOT 0.6 11/16/2011 1341       RADIOGRAPHIC STUDIES:  No results found.  ASSESSMENT: 79 year old female with history of invasive ductal carcinoma with mucinous features originally diagnosed in July 2010 tumor was ER/PR positive HER-2/neu negative. It 06/18/1999 and she underwent a right lumpectomy. This was then followed by radiation therapy from 08/04/2009 to 08/31/2009. She then received adjuvant letrozole. She is tolerating it well. She will complete a total of 5 years in 2015. All questions were answered today.   #2 patient now with a rash unclear etiology. We will proceed with staging studies including CT chest abdomen pelvis mammograms.   PLAN:   #1 patient will discontinue letrozole  #2 full restaging workup was negative for metastatic disease or recurrent breast cancer.  #3 follow up in 12 months   All questions were answered. The patient knows to call the clinic with any problems, questions or concerns. We can certainly see the patient much sooner if necessary.  I spent 15 minutes counseling the patient face to face. The total time spent in the appointment was 20 minutes.    Drue Second, MD Medical/Oncology [facility] [phone]  (beeper) [phone] (Office)

## 2013-09-10 ENCOUNTER — Other Ambulatory Visit: Payer: Medicare Other | Admitting: Lab

## 2013-09-10 ENCOUNTER — Ambulatory Visit: Payer: Medicare Other | Admitting: Oncology

## 2013-10-01 ENCOUNTER — Other Ambulatory Visit: Payer: Self-pay | Admitting: Oncology

## 2013-10-01 DIAGNOSIS — Z853 Personal history of malignant neoplasm of breast: Secondary | ICD-10-CM

## 2013-10-09 ENCOUNTER — Ambulatory Visit
Admission: RE | Admit: 2013-10-09 | Discharge: 2013-10-09 | Disposition: A | Discharge status: Home / Self Care | Payer: Medicare Other | Source: Ambulatory Visit | Attending: Oncology | Admitting: Oncology

## 2013-10-09 ENCOUNTER — Other Ambulatory Visit: Payer: Self-pay | Admitting: Oncology

## 2013-10-09 DIAGNOSIS — Z853 Personal history of malignant neoplasm of breast: Secondary | ICD-10-CM

## 2013-12-14 ENCOUNTER — Other Ambulatory Visit: Payer: Self-pay | Admitting: Orthopedic Surgery

## 2013-12-14 DIAGNOSIS — M19019 Primary osteoarthritis, unspecified shoulder: Secondary | ICD-10-CM

## 2013-12-17 ENCOUNTER — Other Ambulatory Visit: Payer: Medicare Other

## 2013-12-23 ENCOUNTER — Ambulatory Visit
Admission: RE | Admit: 2013-12-23 | Discharge: 2013-12-23 | Disposition: A | Discharge status: Home / Self Care | Payer: Medicare Other | Source: Ambulatory Visit | Attending: Orthopedic Surgery | Admitting: Orthopedic Surgery

## 2013-12-23 DIAGNOSIS — M19019 Primary osteoarthritis, unspecified shoulder: Secondary | ICD-10-CM

## 2014-02-22 ENCOUNTER — Telehealth: Payer: Self-pay | Admitting: Hematology and Oncology

## 2014-02-22 NOTE — Telephone Encounter (Signed)
, °

## 2014-03-01 ENCOUNTER — Other Ambulatory Visit: Payer: Medicare Other

## 2014-03-01 ENCOUNTER — Ambulatory Visit: Payer: Medicare Other | Admitting: Oncology

## 2014-03-30 ENCOUNTER — Ambulatory Visit (HOSPITAL_BASED_OUTPATIENT_CLINIC_OR_DEPARTMENT_OTHER): Payer: Medicare Other | Admitting: Oncology

## 2014-03-30 ENCOUNTER — Encounter: Payer: Self-pay | Admitting: Oncology

## 2014-03-30 ENCOUNTER — Telehealth: Payer: Self-pay | Admitting: Oncology

## 2014-03-30 ENCOUNTER — Other Ambulatory Visit (HOSPITAL_BASED_OUTPATIENT_CLINIC_OR_DEPARTMENT_OTHER): Payer: Medicare Other

## 2014-03-30 ENCOUNTER — Ambulatory Visit: Payer: Medicare Other

## 2014-03-30 ENCOUNTER — Other Ambulatory Visit: Payer: Medicare Other

## 2014-03-30 VITALS — BP 119/65 | HR 83 | Temp 98.0°F | Resp 18 | Ht 61.0 in | Wt 142.2 lb

## 2014-03-30 DIAGNOSIS — C50919 Malignant neoplasm of unspecified site of unspecified female breast: Secondary | ICD-10-CM

## 2014-03-30 DIAGNOSIS — C50519 Malignant neoplasm of lower-outer quadrant of unspecified female breast: Secondary | ICD-10-CM

## 2014-03-30 DIAGNOSIS — E559 Vitamin D deficiency, unspecified: Secondary | ICD-10-CM

## 2014-03-30 LAB — CBC WITH DIFFERENTIAL/PLATELET
BASO%: 0.7 % (ref 0.0–2.0)
BASOS ABS: 0.1 10*3/uL (ref 0.0–0.1)
EOS%: 4 % (ref 0.0–7.0)
Eosinophils Absolute: 0.3 10*3/uL (ref 0.0–0.5)
HCT: 33.1 % — ABNORMAL LOW (ref 34.8–46.6)
HEMOGLOBIN: 10.5 g/dL — AB (ref 11.6–15.9)
LYMPH%: 16.9 % (ref 14.0–49.7)
MCH: 29.2 pg (ref 25.1–34.0)
MCHC: 31.9 g/dL (ref 31.5–36.0)
MCV: 91.7 fL (ref 79.5–101.0)
MONO#: 0.7 10*3/uL (ref 0.1–0.9)
MONO%: 9.4 % (ref 0.0–14.0)
NEUT#: 5 10*3/uL (ref 1.5–6.5)
NEUT%: 69 % (ref 38.4–76.8)
Platelets: 205 10*3/uL (ref 145–400)
RBC: 3.61 10*6/uL — AB (ref 3.70–5.45)
RDW: 14.5 % (ref 11.2–14.5)
WBC: 7.3 10*3/uL (ref 3.9–10.3)
lymph#: 1.2 10*3/uL (ref 0.9–3.3)

## 2014-03-30 LAB — COMPREHENSIVE METABOLIC PANEL (CC13)
ALBUMIN: 3.8 g/dL (ref 3.5–5.0)
ALK PHOS: 87 U/L (ref 40–150)
ALT: 10 U/L (ref 0–55)
AST: 20 U/L (ref 5–34)
Anion Gap: 9 mEq/L (ref 3–11)
BUN: 17.4 mg/dL (ref 7.0–26.0)
CO2: 29 mEq/L (ref 22–29)
CREATININE: 1.1 mg/dL (ref 0.6–1.1)
Calcium: 9.1 mg/dL (ref 8.4–10.4)
Chloride: 103 mEq/L (ref 98–109)
Glucose: 105 mg/dl (ref 70–140)
POTASSIUM: 4.6 meq/L (ref 3.5–5.1)
Sodium: 141 mEq/L (ref 136–145)
Total Bilirubin: 0.36 mg/dL (ref 0.20–1.20)
Total Protein: 6.9 g/dL (ref 6.4–8.3)

## 2014-03-30 NOTE — Telephone Encounter (Signed)
gv pt appt schedule for june 2016. per 03/30/14 pof not sent to scheduling.

## 2014-03-31 LAB — VITAMIN D 25 HYDROXY (VIT D DEFICIENCY, FRACTURES): VIT D 25 HYDROXY: 50 ng/mL (ref 30–89)

## 2014-04-01 ENCOUNTER — Encounter: Payer: Self-pay | Admitting: Oncology

## 2014-04-01 NOTE — Progress Notes (Signed)
[place]  [phone] [phone]     ID: [name] OB: 1918-07-01  MR#: [id number]  QMG#:[id number]  Patient Care Team: Tivis Ringer, MD as PCP - General (Internal Medicine)  CHIEF COMPLAINT:  Chief Complaint  Patient presents with  . Breast Cancer  . Follow-up    DIAGNOSIS: Invasive ductal carcinoma with mucinous features originally diagnosed in July 2010.   PRIOR THERAPY:   #1 patient underwent a screening mammogram that showed a right breast cancer in July 2010. The tumor was ER positive PR positive HER-2/neu negative.  #2 06/17/2009 patient underwent a right lumpectomy with the final pathology revealing a invasive mucinous ductal carcinoma stage I.  #3 patient then received adjuvant radiation therapy from 08/04/2009 to 08/31/2009.  #4 patient then went on to receive letrozole 2.5 mg daily starting in November 2010. Since then she has been followed every 6 months. She is up-to-date on her mammograms.   INTERVAL HISTORY:  Patient returns to clinic for routine followup. She continues to feel well and remains very active. She has discontinued letrozole. She has no neurologic complaints. She denies any recent fevers or illnesses. She has a good appetite and denies weight loss. She denies any pain. She denies any chest pain or shortness of breath. She has no nausea, vomiting, constipation, or diarrhea. She has no urinary complaints. Patient feels at her baseline and offers no specific complaints today.  REVIEW OF SYSTEMS:  As per HPI. Otherwise, 10 point system review was negative.  PAST MEDICAL HISTORY: History reviewed. No pertinent past medical history.  PAST SURGICAL HISTORY: History reviewed. No pertinent past surgical history.  FAMILY HISTORY No family history on file.  GYNECOLOGIC HISTORY:  No LMP recorded. Patient has had a hysterectomy.      ADVANCED DIRECTIVES:    HEALTH MAINTENANCE: History  Substance Use Topics    . Smoking status: Not on file  . Smokeless tobacco: Not on file  . Alcohol Use: Not on file     Colonoscopy:  PAP:  Bone density:  Lipid panel:  Allergies  Allergen Reactions  . Diclofenac Sodium   . Procaine Hcl   . Sulfa Antibiotics   . Vicodin [Hydrocodone-Acetaminophen]   . Novocain [Procaine]     Per patient   . Other     Percandan, coraseden and voltairn (per patient), patient not sure what reaction was.    Current Outpatient Prescriptions  Medication Sig Dispense Refill  . aspirin 81 MG tablet Take 81 mg by mouth daily.      Marland Kitchen BENICAR 20 MG tablet Take 10 mg by mouth daily.       . cholecalciferol (VITAMIN D) 1000 UNITS tablet Take 1,000 Units by mouth 2 (two) times daily.      . clobetasol (TEMOVATE) 0.05 % external solution Apply 1 application topically daily.       . clotrimazole (LOTRIMIN) 1 % cream Apply topically 2 (two) times daily as needed.       . cyclobenzaprine (FLEXERIL) 10 MG tablet Take 10 mg by mouth 3 (three) times daily as needed.      . cycloSPORINE (RESTASIS) 0.05 % ophthalmic emulsion Place 1 drop into both eyes 2 (two) times daily.      Marland Kitchen desonide (DESOWEN) 0.05 % ointment       . escitalopram (LEXAPRO) 5 MG tablet Take 5 mg by mouth daily.       . fentaNYL (DURAGESIC - DOSED MCG/HR) 50 MCG/HR Place 1 patch  onto the skin every 3 (three) days.      . fluocinonide (LIDEX) 0.05 % external solution       . fluticasone (FLONASE) 50 MCG/ACT nasal spray Place 2 sprays into the nose daily.      . hyoscyamine (ANASPAZ) 0.125 MG TBDP Place 0.125 mg under the tongue as needed.       . lansoprazole (PREVACID) 30 MG capsule Take 30 mg by mouth daily.      Marland Kitchen levothyroxine (SYNTHROID, LEVOTHROID) 75 MCG tablet Take 75 mcg by mouth daily before breakfast.      . meclizine (ANTIVERT) 25 MG tablet Take 25 mg by mouth 3 (three) times daily as needed.      Marland Kitchen oxyCODONE (ROXICODONE) 15 MG immediate release tablet Take 10 mg by mouth 2 (two) times daily.      Marland Kitchen  MYRBETRIQ 25 MG TB24 tablet Take 25 mg by mouth daily.      [name] external suspension Apply 1 application topically daily.       No current facility-administered medications for this visit.    OBJECTIVE: Filed Vitals:   03/30/14 1553  BP: 119/65  Pulse: 83  Temp: 98 F (36.7 C)  Resp: 18     Body mass index is 26.88 kg/(m^2).    ECOG FS:0 - Asymptomatic  General: Well-developed, well-nourished, no acute distress. Eyes: Pink conjunctiva, anicteric sclera. Breasts: Bilateral breast and axilla without lumps or masses. Lungs: Clear to auscultation bilaterally. Heart: Regular rate and rhythm. No rubs, murmurs, or gallops. Abdomen: Soft, nontender, nondistended. No organomegaly noted, normoactive bowel sounds. Musculoskeletal: No edema, cyanosis, or clubbing. Neuro: Alert, answering all questions appropriately. Cranial nerves grossly intact. Skin: No rashes or petechiae noted. Psych: Normal affect.    LAB RESULTS:  CMP     Component Value Date/Time   NA 141 03/30/2014 1550   NA 141 11/16/2011 1341   K 4.6 03/30/2014 1550   K 4.3 11/16/2011 1341   CL 104 12/25/2012 1244   CL 106 11/16/2011 1341   CO2 29 03/30/2014 1550   CO2 24 11/16/2011 1341   GLUCOSE 105 03/30/2014 1550   GLUCOSE 104* 12/25/2012 1244   GLUCOSE 89 11/16/2011 1341   BUN 17.4 03/30/2014 1550   BUN 17 11/16/2011 1341   CREATININE 1.1 03/30/2014 1550   CREATININE 1.12* 11/16/2011 1341   CALCIUM 9.1 03/30/2014 1550   CALCIUM 9.1 11/16/2011 1341   PROT 6.9 03/30/2014 1550   PROT 6.7 11/16/2011 1341   ALBUMIN 3.8 03/30/2014 1550   ALBUMIN 4.7 11/16/2011 1341   AST 20 03/30/2014 1550   AST 19 11/16/2011 1341   ALT 10 03/30/2014 1550   ALT 15 11/16/2011 1341   ALKPHOS 87 03/30/2014 1550   ALKPHOS 82 11/16/2011 1341   BILITOT 0.36 03/30/2014 1550   BILITOT 0.6 11/16/2011 1341   GFRNONAA 53* 10/31/2010 0644   GFRAA  Value: >60        The eGFR has been calculated using the MDRD equation. This calculation has not been validated in all clinical  situations. eGFR's persistently <60 mL/min signify possible Chronic Kidney Disease. 10/31/2010 0644    I No results found for this basename: SPEP, UPEP,  kappa and lambda light chains    Lab Results  Component Value Date   WBC 7.3 03/30/2014   NEUTROABS 5.0 03/30/2014   HGB 10.5* 03/30/2014   HCT 33.1* 03/30/2014   MCV 91.7 03/30/2014   PLT 205 03/30/2014      Chemistry  Component Value Date/Time   NA 141 03/30/2014 1550   NA 141 11/16/2011 1341   K 4.6 03/30/2014 1550   K 4.3 11/16/2011 1341   CL 104 12/25/2012 1244   CL 106 11/16/2011 1341   CO2 29 03/30/2014 1550   CO2 24 11/16/2011 1341   BUN 17.4 03/30/2014 1550   BUN 17 11/16/2011 1341   CREATININE 1.1 03/30/2014 1550   CREATININE 1.12* 11/16/2011 1341      Component Value Date/Time   CALCIUM 9.1 03/30/2014 1550   CALCIUM 9.1 11/16/2011 1341   ALKPHOS 87 03/30/2014 1550   ALKPHOS 82 11/16/2011 1341   AST 20 03/30/2014 1550   AST 19 11/16/2011 1341   ALT 10 03/30/2014 1550   ALT 15 11/16/2011 1341   BILITOT 0.36 03/30/2014 1550   BILITOT 0.6 11/16/2011 1341       Lab Results  Component Value Date   LABCA2 29 05/26/2013    No components found with this basename: LABCA125    No results found for this basename: INR,  in the last 168 hours  Urinalysis No results found for this basename: colorurine, appearanceur, labspec, phurine, glucoseu, hgbur, bilirubinur, ketonesur, proteinur, urobilinogen, nitrite, leukocytesur    STUDIES: No results found.  ASSESSMENT:  History of invasive ductal carcinoma with mucinous features originally diagnosed in July 2010 tumor was ER/PR positive HER-2/neu negative. It 06/18/1999 and she underwent a right lumpectomy. This was then followed by radiation therapy from 08/04/2009 to 08/31/2009.   PLAN:   1. Breast cancer: Patient has recently discontinued letrozole. Her most recent mammogram on October 01, 2013 was reported as BI-RADS 2. Repeat in one year. Return to clinic in 1 year with repeat laboratory and  further evaluation. Patient expressed understanding and was in agreement with this plan.   Lloyd Huger, MD   04/01/2014 9:24 AM

## 2014-09-14 ENCOUNTER — Other Ambulatory Visit: Payer: Self-pay | Admitting: Adult Health

## 2014-09-14 DIAGNOSIS — Z853 Personal history of malignant neoplasm of breast: Secondary | ICD-10-CM

## 2014-10-06 ENCOUNTER — Other Ambulatory Visit: Payer: Self-pay | Admitting: Hematology and Oncology

## 2014-10-06 DIAGNOSIS — Z853 Personal history of malignant neoplasm of breast: Secondary | ICD-10-CM

## 2014-10-11 ENCOUNTER — Ambulatory Visit
Admission: RE | Admit: 2014-10-11 | Discharge: 2014-10-11 | Disposition: A | Discharge status: Home / Self Care | Payer: Medicare Other | Source: Ambulatory Visit | Attending: Adult Health | Admitting: Adult Health

## 2014-10-11 DIAGNOSIS — Z853 Personal history of malignant neoplasm of breast: Secondary | ICD-10-CM

## 2014-11-17 ENCOUNTER — Telehealth: Payer: Self-pay | Admitting: Hematology and Oncology

## 2014-11-17 NOTE — Telephone Encounter (Signed)
due to pal moved 6/9 appt to 6/3. left message for pt and mailed schedule.

## 2014-12-27 ENCOUNTER — Ambulatory Visit: Payer: Self-pay | Admitting: Podiatry

## 2014-12-31 ENCOUNTER — Encounter: Payer: Self-pay | Admitting: Podiatry

## 2014-12-31 ENCOUNTER — Ambulatory Visit (INDEPENDENT_AMBULATORY_CARE_PROVIDER_SITE_OTHER): Payer: Medicare Other | Admitting: Podiatry

## 2014-12-31 VITALS — BP 153/94 | HR 79 | Ht 61.0 in | Wt 144.0 lb

## 2014-12-31 DIAGNOSIS — M79604 Pain in right leg: Secondary | ICD-10-CM

## 2014-12-31 DIAGNOSIS — M79673 Pain in unspecified foot: Secondary | ICD-10-CM | POA: Diagnosis not present

## 2014-12-31 DIAGNOSIS — M722 Plantar fascial fibromatosis: Secondary | ICD-10-CM | POA: Diagnosis not present

## 2014-12-31 DIAGNOSIS — B351 Tinea unguium: Secondary | ICD-10-CM | POA: Diagnosis not present

## 2014-12-31 DIAGNOSIS — M65971 Unspecified synovitis and tenosynovitis, right ankle and foot: Secondary | ICD-10-CM | POA: Insufficient documentation

## 2014-12-31 DIAGNOSIS — M79606 Pain in leg, unspecified: Secondary | ICD-10-CM

## 2014-12-31 DIAGNOSIS — M659 Synovitis and tenosynovitis, unspecified: Secondary | ICD-10-CM

## 2014-12-31 NOTE — Patient Instructions (Signed)
Seen for pain in right foot and ankle. Cortisone injection given. All nails debrided. Return in 3 months.

## 2014-12-31 NOTE — Progress Notes (Signed)
SUBJECTIVE: 81 y.o. year old female presents accompanied by her daughter complaining of right foot and ankle pain. Stated that she was having left foot pain till she got a shot from her orthopedist. Also requesting toe nails trimmed.   OBJECTIVE: DERMATOLOGIC EXAMINATION: Nails: Thick discolored both great toe nail with dry blood under nail plate on right. No open skin lesions. VASCULAR EXAMINATION OF LOWER LIMBS: Pedal pulses: All pedal pulses are palpable with normal pulsation.  Mild foot and ankle edema bilateral.  Capillary Filling times within 3 seconds in all digits.  Temperature gradient from tibial crest to dorsum of foot is within normal bilateral. NEUROLOGIC EXAMINATION OF THE LOWER LIMBS: All epicritic and tactile sensations grossly intact.  MUSCULOSKELETAL EXAMINATION: Mild hallux valgus with bunion bilateral.   ASSESSMENT: Onychomycosis x 10. Tenosynovitis right ankle. Plantar fasciitis right.  PLAN: Cortisone injection given to right ankle and plantar heel as per request. Injected with mixture of 4 mg Dexamethasone, 4 mg Triamcinolone, and 1 cc of 0.5% Marcaine plain. Patient tolerated well without difficulty.  All nails debrided. Return in 3 months or as needed.

## 2015-02-16 ENCOUNTER — Ambulatory Visit (INDEPENDENT_AMBULATORY_CARE_PROVIDER_SITE_OTHER): Payer: Medicare Other

## 2015-02-16 ENCOUNTER — Encounter: Payer: Self-pay | Admitting: Podiatry

## 2015-02-16 ENCOUNTER — Other Ambulatory Visit: Payer: Self-pay | Admitting: Podiatry

## 2015-02-16 ENCOUNTER — Ambulatory Visit (INDEPENDENT_AMBULATORY_CARE_PROVIDER_SITE_OTHER): Payer: Medicare Other | Admitting: Podiatry

## 2015-02-16 VITALS — BP 131/55 | HR 72 | Resp 18

## 2015-02-16 DIAGNOSIS — R52 Pain, unspecified: Secondary | ICD-10-CM | POA: Diagnosis not present

## 2015-02-16 DIAGNOSIS — M19071 Primary osteoarthritis, right ankle and foot: Secondary | ICD-10-CM | POA: Diagnosis not present

## 2015-02-20 NOTE — Progress Notes (Signed)
Patient ID: [name], female   DOB: May 25, 1918, 81 y.o.   MRN: [id number]  Subjective: 81 year old female presents the office today with complaints of right foot pain most on the top of her right foot which has been ongoing for approximate 4 days. She does that the right foot has been swelling intermittently. She denies any history of injury or trauma. She denies any change or increase activity at the time of onset the symptoms. She has had no prior treatment.  Denies any systemic complaints such as fevers, chills, nausea, vomiting. No acute changes since last appointment, and no other complaints at this time.   Objective: AAO x3, NAD DP/PT pulses palpable bilaterally, CRT less than 3 seconds Protective sensation intact with Simms Weinstein monofilament, vibratory sensation intact, Achilles tendon reflex intact There is mild diffuse tenderness along the dorsal aspect of the right midfoot. There is no specific area pinpoint bony tenderness or pain with vibratory sensation. There is trace overlying edema to the right foot however there is edema to bilateral lower extremities which appears to be chronic. There is no overlying erythema or increase in warmth. There is no tenderness to palpation overlying the contralateral extremity. MMT 5/5, ROM WNL. No edema, erythema, increase in warmth to bilateral lower extremities.  No open lesions or pre-ulcerative lesions.  No pain with calf compression, swelling, warmth, erythema  Assessment: 81 year old female with right midfoot pain, likely osteoarthritis  Plan: -X-rays were obtained and reviewed the patient -All treatment options discussed with the patient including all alternatives, risks, complications.  -At this appointment to discuss a steroid injection to the area to help calm down the inflammation and pain. Risks and complications of this were discussed the patient for which she understands verbally consents. Under sterile conditions a total of 1  mL dexamethasone phosphate was infiltrated into the area of maximal tenderness without complications. Band-Aid was applied. Postreduction care was discussed the patient. -Discussed shoe gear modifications. Also discuss orthotics to help support her foot type. -Ice to the area -Follow-up of symptoms is not resolved within 4 weeks or sooner if any problems are to arise.  -Patient encouraged to call the office with any questions, concerns, change in symptoms.

## 2015-03-09 ENCOUNTER — Ambulatory Visit: Payer: Medicare Other | Admitting: Podiatry

## 2015-03-16 ENCOUNTER — Ambulatory Visit (INDEPENDENT_AMBULATORY_CARE_PROVIDER_SITE_OTHER): Payer: Medicare Other | Admitting: Podiatry

## 2015-03-16 ENCOUNTER — Encounter: Payer: Self-pay | Admitting: Podiatry

## 2015-03-16 ENCOUNTER — Ambulatory Visit (INDEPENDENT_AMBULATORY_CARE_PROVIDER_SITE_OTHER): Payer: Medicare Other

## 2015-03-16 VITALS — BP 86/52 | HR 87 | Resp 18

## 2015-03-16 DIAGNOSIS — R52 Pain, unspecified: Secondary | ICD-10-CM

## 2015-03-16 DIAGNOSIS — M19071 Primary osteoarthritis, right ankle and foot: Secondary | ICD-10-CM | POA: Diagnosis not present

## 2015-03-20 NOTE — Progress Notes (Signed)
Patient ID: [name], female   DOB: 07/12/1918, 81 y.o.   MRN: [id number]  Subjective: 81 year old female presents the office they fall evaluation of right midfoot pain. She states that since last when she does believe that the pain to her foot is somewhat better however she started to have pain in her ankle. She denies any history of injury or trauma. She denies any swelling or overlying redness. She states the pain is not consistent and is intermittent in nature. Denies any leg cramps or any swelling to the calf. She states that he not to the compound cream which is prescribed last appointment. Denies any systemic complaints such as fevers, chills, nausea, vomiting. No acute changes since last appointment, and no other complaints at this time.   Objective: AAO x3, NAD DP/PT pulses palpable bilaterally, CRT less than 3 seconds Protective sensation intact with Simms Weinstein monofilament There is currently no areas of tenderness overlying the dorsal aspect of the right foot. There is mild discomfort along the anterior aspect of ankle joint. There is no pinpoint bony tenderness or pain the vibratory sensation overlying the tibia, fibula, proximal tib-fib. There is mild discomfort with ankle joint range of motion have there is no crepitus. There is no overlying edema, erythema, increase in warmth.  No areas of pinpoint bony tenderness or pain with vibratory sensation. MMT 5/5, ROM WNL. No edema, erythema, increase in warmth to bilateral lower extremities.  No open lesions or pre-ulcerative lesions.  No pain with calf compression, swelling, warmth, erythema  Assessment: 81 year old female with right ankle, foot osteoarthritis  Plan: -X-rays were obtained of the right ankle and discussed the patient. -All treatment options discussed with the patient including all alternatives, risks, complications.  -Dispensed ankle brace to wear as needed -Re-ordered compound cream and it was faxed to the  pharmacy -Ice to the area -Follow-up in 6 weeks or sooner should any problems arise.  -Patient encouraged to call the office with any questions, concerns, change in symptoms.

## 2015-03-24 ENCOUNTER — Other Ambulatory Visit: Payer: Self-pay | Admitting: *Deleted

## 2015-03-24 DIAGNOSIS — C50919 Malignant neoplasm of unspecified site of unspecified female breast: Secondary | ICD-10-CM

## 2015-03-24 NOTE — Assessment & Plan Note (Signed)
invasive ductal carcinoma with mucinous features originally diagnosed in July 2010 tumor was ER/PR positive HER-2/neu negative. It 06/18/1999 and she underwent a right lumpectomy. This was then followed by radiation therapy from 08/04/2009 to 08/31/2009. She then received adjuvant letrozole. She is tolerating it well. She completed a total of 5 years in 08/2014  Breast Cancer Surveillance: 1. Breast exam 03/25/2015: Normal 2. Mammogram 10/11/2014 No abnormalities. Postsurgical changes. Breast Density Category C. I recommended that she get 3-D mammograms for surveillance. Discussed the differences between different breast density categories.   Return to clinic in 1 year for follow-up

## 2015-03-25 ENCOUNTER — Ambulatory Visit (HOSPITAL_BASED_OUTPATIENT_CLINIC_OR_DEPARTMENT_OTHER): Payer: Medicare Other | Admitting: Hematology and Oncology

## 2015-03-25 ENCOUNTER — Telehealth: Payer: Self-pay | Admitting: Hematology and Oncology

## 2015-03-25 ENCOUNTER — Other Ambulatory Visit (HOSPITAL_BASED_OUTPATIENT_CLINIC_OR_DEPARTMENT_OTHER): Payer: Medicare Other

## 2015-03-25 VITALS — BP 129/60 | HR 72 | Temp 98.3°F | Resp 18 | Ht 61.0 in | Wt 141.7 lb

## 2015-03-25 DIAGNOSIS — C50919 Malignant neoplasm of unspecified site of unspecified female breast: Secondary | ICD-10-CM

## 2015-03-25 DIAGNOSIS — C50911 Malignant neoplasm of unspecified site of right female breast: Secondary | ICD-10-CM

## 2015-03-25 DIAGNOSIS — Z853 Personal history of malignant neoplasm of breast: Secondary | ICD-10-CM

## 2015-03-25 LAB — COMPREHENSIVE METABOLIC PANEL (CC13)
ALBUMIN: 3.8 g/dL (ref 3.5–5.0)
ALT: 8 U/L (ref 0–55)
AST: 21 U/L (ref 5–34)
Alkaline Phosphatase: 79 U/L (ref 40–150)
Anion Gap: 9 mEq/L (ref 3–11)
BUN: 15.5 mg/dL (ref 7.0–26.0)
CO2: 27 mEq/L (ref 22–29)
CREATININE: 1.2 mg/dL — AB (ref 0.6–1.1)
Calcium: 9.3 mg/dL (ref 8.4–10.4)
Chloride: 105 mEq/L (ref 98–109)
EGFR: 37 mL/min/{1.73_m2} — AB (ref >90)
GLUCOSE: 93 mg/dL (ref 70–140)
Potassium: 4.2 mEq/L (ref 3.5–5.1)
Sodium: 141 mEq/L (ref 136–145)
Total Bilirubin: 0.53 mg/dL (ref 0.20–1.20)
Total Protein: 6.7 g/dL (ref 6.4–8.3)

## 2015-03-25 LAB — CBC WITH DIFFERENTIAL/PLATELET
BASO%: 0.7 % (ref 0.0–2.0)
Basophils Absolute: 0 10*3/uL (ref 0.0–0.1)
EOS ABS: 0.4 10*3/uL (ref 0.0–0.5)
EOS%: 7.1 % — ABNORMAL HIGH (ref 0.0–7.0)
HEMATOCRIT: 35.1 % (ref 34.8–46.6)
HGB: 11.3 g/dL — ABNORMAL LOW (ref 11.6–15.9)
LYMPH#: 1.2 10*3/uL (ref 0.9–3.3)
LYMPH%: 23.5 % (ref 14.0–49.7)
MCH: 29.7 pg (ref 25.1–34.0)
MCHC: 32.1 g/dL (ref 31.5–36.0)
MCV: 92.6 fL (ref 79.5–101.0)
MONO#: 0.5 10*3/uL (ref 0.1–0.9)
MONO%: 9.3 % (ref 0.0–14.0)
NEUT#: 3.1 10*3/uL (ref 1.5–6.5)
NEUT%: 59.4 % (ref 38.4–76.8)
Platelets: 219 10*3/uL (ref 145–400)
RBC: 3.79 10*6/uL (ref 3.70–5.45)
RDW: 14.5 % (ref 11.2–14.5)
WBC: 5.2 10*3/uL (ref 3.9–10.3)

## 2015-03-25 NOTE — Telephone Encounter (Signed)
Appointments made and avs printed for patient °

## 2015-03-25 NOTE — Progress Notes (Signed)
Patient Care Team: Prince Solian, MD as PCP - General (Internal Medicine)  DIAGNOSIS: No matching staging information was found for the patient.  SUMMARY OF ONCOLOGIC HISTORY:   Breast cancer, right breast   06/17/2009 Surgery Right lumpectomy: Invasive mucinous ductal carcinoma stage I   08/15/2009 - 08/31/2009 Radiation Therapy Adjuvant radiation therapy   08/23/2009 - 03/24/2014 Anti-estrogen oral therapy Letrozole 2.5 mg daily    CHIEF COMPLIANT: Follow-up of right breast cancer  INTERVAL HISTORY: [name] is a 81 year old with above-mentioned history of right breast cancer treated with lumpectomy radiation and letrozole for 5 years and was completed in 2015. She is in a wheelchair and does not have any complaints or concerns.  REVIEW OF SYSTEMS:   Constitutional: Denies fevers, chills or abnormal weight loss Eyes: Denies blurriness of vision Ears, nose, mouth, throat, and face: Denies mucositis or sore throat Respiratory: Denies cough, dyspnea or wheezes Cardiovascular: Denies palpitation, chest discomfort or lower extremity swelling Gastrointestinal:  Denies nausea, heartburn or change in bowel habits Skin: Denies abnormal skin rashes Lymphatics: Denies new lymphadenopathy or easy bruising Neurological:Denies numbness, tingling or new weaknesses Behavioral/Psych: Mood is stable, no new changes  Breast:  denies any pain or lumps or nodules in either breasts All other systems were reviewed with the patient and are negative.  I have reviewed the past medical history, past surgical history, social history and family history with the patient and they are unchanged from previous note.  ALLERGIES:  is allergic to diclofenac sodium; procaine hcl; sulfa antibiotics; vicodin; novocain; and other.  MEDICATIONS:  Current Outpatient Prescriptions  Medication Sig Dispense Refill  . aspirin 81 MG tablet Take 81 mg by mouth daily.    Marland Kitchen BENICAR 20 MG tablet Take 10 mg by mouth daily.      . cholecalciferol (VITAMIN D) 1000 UNITS tablet Take 1,000 Units by mouth 2 (two) times daily.    . clobetasol (TEMOVATE) 0.05 % external solution Apply 1 application topically daily.     . clonazepam (KLONOPIN) 0.125 MG disintegrating tablet   1  . clotrimazole (LOTRIMIN) 1 % cream Apply topically 2 (two) times daily as needed.     . cyclobenzaprine (FLEXERIL) 10 MG tablet Take 10 mg by mouth 3 (three) times daily as needed.    . cycloSPORINE (RESTASIS) 0.05 % ophthalmic emulsion Place 1 drop into both eyes 2 (two) times daily.    Marland Kitchen desonide (DESOWEN) 0.05 % ointment     . escitalopram (LEXAPRO) 10 MG tablet   11  . escitalopram (LEXAPRO) 5 MG tablet Take 5 mg by mouth daily.     . fentaNYL (DURAGESIC - DOSED MCG/HR) 50 MCG/HR Place 1 patch onto the skin every 3 (three) days.    . fluocinonide (LIDEX) 0.05 % external solution     . fluticasone (FLONASE) 50 MCG/ACT nasal spray Place 2 sprays into the nose daily.    . hyoscyamine (ANASPAZ) 0.125 MG TBDP Place 0.125 mg under the tongue as needed.     . lansoprazole (PREVACID) 30 MG capsule Take 30 mg by mouth daily.    Marland Kitchen levofloxacin (LEVAQUIN) 500 MG tablet     . levothyroxine (SYNTHROID, LEVOTHROID) 75 MCG tablet Take 75 mcg by mouth daily before breakfast.    . levothyroxine (SYNTHROID, LEVOTHROID) 88 MCG tablet   2  . meclizine (ANTIVERT) 25 MG tablet Take 25 mg by mouth 3 (three) times daily as needed.    Marland Kitchen MYRBETRIQ 25 MG TB24 tablet Take 25 mg by  mouth daily.    Marland Kitchen oxyCODONE (ROXICODONE) 15 MG immediate release tablet Take 10 mg by mouth 2 (two) times daily.    Donita Brooks external suspension Apply 1 application topically daily.    . traZODone (DESYREL) 50 MG tablet   3   No current facility-administered medications for this visit.    PHYSICAL EXAMINATION: ECOG PERFORMANCE STATUS: 1 - Symptomatic but completely ambulatory  Filed Vitals:   03/25/15 1107  BP: 129/60  Pulse: 72  Temp: 98.3 F (36.8 C)  Resp: 18   Filed  Weights   03/25/15 1107  Weight: 141 lb 11.2 oz (64.275 kg)    GENERAL:alert, no distress and comfortable SKIN: skin color, texture, turgor are normal, no rashes or significant lesions EYES: normal, Conjunctiva are pink and non-injected, sclera clear OROPHARYNX:no exudate, no erythema and lips, buccal mucosa, and tongue normal  NECK: supple, thyroid normal size, non-tender, without nodularity LYMPH:  no palpable lymphadenopathy in the cervical, axillary or inguinal LUNGS: clear to auscultation and percussion with normal breathing effort HEART: regular rate & rhythm and no murmurs and no lower extremity edema ABDOMEN:abdomen soft, non-tender and normal bowel sounds Musculoskeletal:no cyanosis of digits and no clubbing  NEURO: alert & oriented x 3 with fluent speech, no focal motor/sensory deficits BREAST: No palpable masses or nodules in either right or left breasts. No palpable axillary supraclavicular or infraclavicular adenopathy no breast tenderness or nipple discharge. (exam performed in the presence of a chaperone)  LABORATORY DATA:  I have reviewed the data as listed   Chemistry      Component Value Date/Time   NA 141 03/25/2015 1037   NA 141 11/16/2011 1341   K 4.2 03/25/2015 1037   K 4.3 11/16/2011 1341   CL 104 12/25/2012 1244   CL 106 11/16/2011 1341   CO2 27 03/25/2015 1037   CO2 24 11/16/2011 1341   BUN 15.5 03/25/2015 1037   BUN 17 11/16/2011 1341   CREATININE 1.2* 03/25/2015 1037   CREATININE 1.12* 11/16/2011 1341      Component Value Date/Time   CALCIUM 9.3 03/25/2015 1037   CALCIUM 9.1 11/16/2011 1341   ALKPHOS 79 03/25/2015 1037   ALKPHOS 82 11/16/2011 1341   AST 21 03/25/2015 1037   AST 19 11/16/2011 1341   ALT 8 03/25/2015 1037   ALT 15 11/16/2011 1341   BILITOT 0.53 03/25/2015 1037   BILITOT 0.6 11/16/2011 1341       Lab Results  Component Value Date   WBC 5.2 03/25/2015   HGB 11.3* 03/25/2015   HCT 35.1 03/25/2015   MCV 92.6 03/25/2015    PLT 219 03/25/2015   NEUTROABS 3.1 03/25/2015     RADIOGRAPHIC STUDIES: I have personally reviewed the radiology reports and agreed with their findings. Mammogram December 2015 is normal  ASSESSMENT & PLAN:  Breast cancer, right breast invasive ductal carcinoma with mucinous features originally diagnosed in July 2010 tumor was ER/PR positive HER-2/neu negative. It 06/18/1999 and she underwent a right lumpectomy. This was then followed by radiation therapy from 08/04/2009 to 08/31/2009. She then received adjuvant letrozole. She is tolerating it well. She completed a total of 5 years in 08/2014  Breast Cancer Surveillance: 1. Breast exam 03/25/2015: Normal 2. Mammogram 10/11/2014 No abnormalities. Postsurgical changes. Breast Density Category C.  Since the patient is 81 years old I discussed with her about the role of mammograms. She wanted to not get any further mammograms in the future. So we will do annual breast exams and  follow-up. Return to clinic in 1 year for follow-up   Yesterday No orders of the defined types were placed in this encounter.   The patient has a good understanding of the overall plan. she agrees with it. she will call with any problems that may develop before the next visit here.   Rulon Eisenmenger, MD

## 2015-03-31 ENCOUNTER — Other Ambulatory Visit: Payer: Medicare Other

## 2015-03-31 ENCOUNTER — Ambulatory Visit: Payer: Medicare Other | Admitting: Hematology and Oncology

## 2015-04-01 ENCOUNTER — Ambulatory Visit: Payer: Medicare Other | Admitting: Podiatry

## 2015-05-12 ENCOUNTER — Ambulatory Visit: Payer: Medicare Other | Admitting: Physical Therapy

## 2015-05-13 ENCOUNTER — Encounter: Payer: Self-pay | Admitting: Podiatry

## 2015-05-13 ENCOUNTER — Ambulatory Visit (INDEPENDENT_AMBULATORY_CARE_PROVIDER_SITE_OTHER): Payer: Medicare Other

## 2015-05-13 ENCOUNTER — Ambulatory Visit (INDEPENDENT_AMBULATORY_CARE_PROVIDER_SITE_OTHER): Payer: Medicare Other | Admitting: Podiatry

## 2015-05-13 VITALS — BP 139/53 | HR 80 | Resp 18

## 2015-05-13 DIAGNOSIS — M19071 Primary osteoarthritis, right ankle and foot: Secondary | ICD-10-CM

## 2015-05-13 DIAGNOSIS — M779 Enthesopathy, unspecified: Secondary | ICD-10-CM | POA: Diagnosis not present

## 2015-05-13 DIAGNOSIS — M659 Synovitis and tenosynovitis, unspecified: Secondary | ICD-10-CM | POA: Diagnosis not present

## 2015-05-13 DIAGNOSIS — M722 Plantar fascial fibromatosis: Secondary | ICD-10-CM

## 2015-05-16 NOTE — Progress Notes (Signed)
Patient ID: [name], female   DOB: 1918-04-09, 81 y.o.   MRN: [id number]  Subjective: 81 year old female presents the office today elevated of right foot pain. She states the majority of her pain now is localized the bottom of her right foot and to the back which she points the Achilles tendon. She says the area feels tight. It is worse after periods of rest or in the mornings. Pain to the top of her foot and her ankle is improved. Denies any swelling or redness recently. Denies any recent injury or trauma. No other complaints at this time.  Objective: AAO 3, NAD Neurovascular status unchanged; negative Tinel sign There is tenderness to palpation over the medial band of plantar fascial in the arch of the foot on the right side and along the insertion of the calcaneal tuberosity. The plantar fascia appears to be intact. There is mild discomfort along the insertion of the Achilles tendon into the calcaneus. There is no defect noted. Thompson test is negative. No pain with lateral compression of the calcaneus. There is exostosis of the dorsal aspects of the midfoot however there is no tenderness on patient equinus. The time. There is no pain along the ankle or with range of motion angle this time. No other areas of tenderness bilaterally. No edema, erythema, increase in warmth. No open lesions pre-ulcerative lesions. There is no pain with calf compression, swelling, warmth, erythema.  Assessment: 81 year old female with likely plantar fasciitis, Achilles tendinitis right foot  Plan: -X-rays were obtained and reviewed with the patient.  -Treatment options discussed including all alternatives, risks, and complications -Discussed likely etiology -Dispensed night splint -Discussed stretching exercises -Continue shoegear modifications/orthotics.  -Follow-up 6 weeks if not resolved or sooner if any problems arise. In the meantime, encouraged to call the office with any questions, concerns, change in  symptoms.   Celesta Gentile, DPM

## 2015-05-17 ENCOUNTER — Ambulatory Visit: Payer: Medicare Other | Admitting: Physical Therapy

## 2015-07-01 ENCOUNTER — Ambulatory Visit: Payer: Medicare Other | Admitting: Podiatry

## 2015-07-22 ENCOUNTER — Encounter: Payer: Self-pay | Admitting: Podiatry

## 2015-07-22 ENCOUNTER — Ambulatory Visit (INDEPENDENT_AMBULATORY_CARE_PROVIDER_SITE_OTHER): Payer: Medicare Other | Admitting: Podiatry

## 2015-07-22 VITALS — BP 128/63 | HR 76 | Resp 12

## 2015-07-22 DIAGNOSIS — M19071 Primary osteoarthritis, right ankle and foot: Secondary | ICD-10-CM | POA: Diagnosis not present

## 2015-07-22 DIAGNOSIS — M722 Plantar fascial fibromatosis: Secondary | ICD-10-CM | POA: Diagnosis not present

## 2015-07-22 NOTE — Progress Notes (Signed)
Patient ID: [name], female   DOB: 1918/09/10, 81 y.o.   MRN: [id number]  Subjective: 81 year old female presents the office on evaluation of right foot pain. She states that her pain has improved her foot all she does continue gets some pain to the top midfoot. She states the pain to her heel has improved. She denies any recent swelling or redness to the area and denies any acute changes. No recent injury or trauma. As her pain continues to be with weightbearing.  Objective: AAO 3, NAD Neurovascular status unchanged Discontinue tenderness to palpation along the dorsal aspect of the right midfoot. There is a prominence on the dorsal midfoot as well as likely from arthritic changes. There is no specific area pinpoint bony tenderness or pain the vibratory sensation. There is no overlying edema, erythema, increased warmth. There is mild tenderness to palpation along the medial band of plantar fascia within the arch of the foot. Is no pain on the insertion of the calcaneus. Is no pain lateral compression of the calcaneus. No other areas of tenderness to bilateral lower extremities. No open lesions or pre-ulcerative lesions. There is no pain with calf compression, swelling, warmth, erythema.  Assessment: 81 year old female continuation a right dorsal midfoot pain and resolving plantar fasciitis  Plan: -Treatment options discussed including all alternatives, risks, and complications -I discussed interval tear and gel to the area however they wish off on that this time. I discussed aspercreme. They will try this first before proceeding with topical treatment. -Removal plantar fascial brace was applied to help support the midfoot. -I discussed with her more supportive orthotic. She had a Dr. Darrick Grinder insole which provides some support but is not provide enough support for her foot type. Discussed supportive shoe gear. -She wishes a Lachman steroid injection this time. -Follow-up if symptoms are not  completely resolved in 6 weeks or sooner if any problems arise. In the meantime, encouraged to call the office with any questions, concerns, change in symptoms.   Celesta Gentile, DPM

## 2015-09-06 ENCOUNTER — Other Ambulatory Visit: Payer: Self-pay | Admitting: Hematology and Oncology

## 2015-09-06 DIAGNOSIS — N644 Mastodynia: Secondary | ICD-10-CM

## 2015-09-06 DIAGNOSIS — Z853 Personal history of malignant neoplasm of breast: Secondary | ICD-10-CM

## 2015-10-28 ENCOUNTER — Other Ambulatory Visit: Payer: Self-pay | Admitting: Hematology and Oncology

## 2015-10-28 ENCOUNTER — Other Ambulatory Visit: Payer: Self-pay

## 2015-10-28 DIAGNOSIS — Z853 Personal history of malignant neoplasm of breast: Secondary | ICD-10-CM

## 2015-10-28 DIAGNOSIS — N644 Mastodynia: Secondary | ICD-10-CM

## 2015-11-02 ENCOUNTER — Other Ambulatory Visit: Payer: Medicare Other

## 2016-02-01 ENCOUNTER — Emergency Department (HOSPITAL_COMMUNITY): Payer: Medicare Other

## 2016-02-01 ENCOUNTER — Emergency Department (HOSPITAL_COMMUNITY)
Admission: EM | Admit: 2016-02-01 | Discharge: 2016-02-01 | Disposition: A | Discharge status: Home / Self Care | Payer: Medicare Other | Attending: Emergency Medicine | Admitting: Emergency Medicine

## 2016-02-01 ENCOUNTER — Encounter (HOSPITAL_COMMUNITY): Payer: Self-pay | Admitting: Emergency Medicine

## 2016-02-01 DIAGNOSIS — M542 Cervicalgia: Secondary | ICD-10-CM | POA: Insufficient documentation

## 2016-02-01 DIAGNOSIS — G8929 Other chronic pain: Secondary | ICD-10-CM | POA: Insufficient documentation

## 2016-02-01 DIAGNOSIS — Z7951 Long term (current) use of inhaled steroids: Secondary | ICD-10-CM | POA: Diagnosis not present

## 2016-02-01 DIAGNOSIS — Z7982 Long term (current) use of aspirin: Secondary | ICD-10-CM | POA: Insufficient documentation

## 2016-02-01 DIAGNOSIS — Z79899 Other long term (current) drug therapy: Secondary | ICD-10-CM | POA: Diagnosis not present

## 2016-02-01 DIAGNOSIS — M1990 Unspecified osteoarthritis, unspecified site: Secondary | ICD-10-CM | POA: Insufficient documentation

## 2016-02-01 HISTORY — DX: Unspecified osteoarthritis, unspecified site: M19.90

## 2016-02-01 LAB — I-STAT CHEM 8, ED
BUN: 8 mg/dL (ref 6–20)
CREATININE: 1 mg/dL (ref 0.44–1.00)
Calcium, Ion: 1.14 mmol/L (ref 1.13–1.30)
Chloride: 101 mmol/L (ref 101–111)
GLUCOSE: 87 mg/dL (ref 65–99)
HCT: 36 % (ref 36.0–46.0)
HEMOGLOBIN: 12.2 g/dL (ref 12.0–15.0)
POTASSIUM: 3.8 mmol/L (ref 3.5–5.1)
Sodium: 141 mmol/L (ref 135–145)
TCO2: 27 mmol/L (ref 0–100)

## 2016-02-01 MED ORDER — CYCLOBENZAPRINE HCL 5 MG PO TABS: 5.0000 mg | ORAL_TABLET | Freq: Three times a day (TID) | ORAL | Status: DC | PRN

## 2016-02-01 MED ORDER — OXYCODONE HCL 5 MG PO TABS: 5.0000 mg | ORAL_TABLET | Freq: Once | ORAL | Status: DC

## 2016-02-01 MED ORDER — CYCLOBENZAPRINE HCL 10 MG PO TABS
5.0000 mg | ORAL_TABLET | Freq: Once | ORAL | Status: AC
  Administered 2016-02-01: 5 mg via ORAL
  Filled 2016-02-01: qty 1

## 2016-02-01 MED ORDER — METHYLPREDNISOLONE 4 MG PO TBPK: ORAL_TABLET | ORAL | Status: DC

## 2016-02-01 NOTE — ED Notes (Signed)
Pt ambulated to restroom with supervision.

## 2016-02-01 NOTE — ED Provider Notes (Signed)
Per verbal radiology report by Dr. Pascal Lux, CT scan shows no acute pathology. There is evidence of prior ACDF of cervical spine as well as chronic dens fracture. There is also chronic appearing fracture of body of c1.  CT scan results discussed with Dr. Lorin Mercy her spine surgeon. Dr. Lorin Mercy states per his records the dens fracture and C1 fracture is new since 2007. Patient's pain is controlled with her home pain regimen. He advises soft collar and follow-up in the office. She has equal strength throughout.   BP 162/65 mmHg  Pulse 66  Temp(Src) 98.2 F (36.8 C) (Oral)  Resp 14  Ht 5\' 1"  (1.549 m)  Wt 138 lb (62.596 kg)  BMI 26.09 kg/m2  SpO2 97%   Ezequiel Essex, MD 02/01/16 1631

## 2016-02-01 NOTE — ED Notes (Signed)
Pt taken to CT.

## 2016-02-01 NOTE — ED Provider Notes (Signed)
CSN: [id number]     Arrival date & time 02/01/16  0216 History   First MD Initiated Contact with Patient 02/01/16 0349     Chief Complaint  Patient presents with  . Neck Pain     (Consider location/radiation/quality/duration/timing/severity/associated sxs/prior Treatment) HPI  This is a 82 year old female who presents with neck pain. No significant medical history. Patient reports 3-4 month history of increasing left neck pain and stiffness. It is worse with certain movements. Patient has taken oxycodone at home with some relief. Currently her pain is 8 out of 10. Her pain got worse tonight.  Her pain does not radiate. She denies any weakness, numbness, tingling of the upper extremities. She denies any difficulty with ambulation. Denies fever or headache.  Past Medical History  Diagnosis Date  . Arthritis    Past Surgical History  Procedure Laterality Date  . Replacement total knee    . Carpal tunnel release     No family history on file. Social History  Substance Use Topics  . Smoking status: Never Smoker   . Smokeless tobacco: Never Used  . Alcohol Use: None   OB History    No data available     Review of Systems  Constitutional: Negative for fever.  Genitourinary: Negative for difficulty urinating.  Musculoskeletal: Positive for neck pain and neck stiffness.  Neurological: Negative for weakness and headaches.  All other systems reviewed and are negative.     Allergies  Diclofenac sodium; Procaine hcl; Sulfa antibiotics; Vicodin; Novocain; and Other  Home Medications   Prior to Admission medications   Medication Sig Start Date End Date Taking? Authorizing Provider  aspirin 81 MG tablet Take 81 mg by mouth daily.   Yes Historical Provider, MD  BENICAR 20 MG tablet Take 10 mg by mouth daily.  08/23/12  Yes Historical Provider, MD  cholecalciferol (VITAMIN D) 1000 UNITS tablet Take 1,000 Units by mouth 2 (two) times daily.   Yes Historical Provider, MD  clobetasol  (TEMOVATE) 0.05 % external solution Apply 1 application topically daily.  12/23/12  Yes Historical Provider, MD  clonazepam (KLONOPIN) 0.125 MG disintegrating tablet Take 0.125 mg by mouth 2 (two) times daily as needed for seizure.  03/04/15  Yes Historical Provider, MD  clotrimazole (LOTRIMIN) 1 % cream Apply topically 2 (two) times daily as needed.    Yes Historical Provider, MD  cyclobenzaprine (FLEXERIL) 10 MG tablet Take 10 mg by mouth 3 (three) times daily as needed for muscle spasms.    Yes Historical Provider, MD  cycloSPORINE (RESTASIS) 0.05 % ophthalmic emulsion Place 1 drop into both eyes 2 (two) times daily.   Yes Historical Provider, MD  escitalopram (LEXAPRO) 10 MG tablet Take 10 mg by mouth daily.  02/22/15  Yes Historical Provider, MD  fentaNYL (DURAGESIC - DOSED MCG/HR) 50 MCG/HR Place 1 patch onto the skin every other day.    Yes Historical Provider, MD  fluticasone (FLONASE) 50 MCG/ACT nasal spray Place 2 sprays into the nose daily.   Yes Historical Provider, MD  hyoscyamine (ANASPAZ) 0.125 MG TBDP Place 0.125 mg under the tongue every 6 (six) hours as needed for bladder spasms.    Yes Historical Provider, MD  lansoprazole (PREVACID) 30 MG capsule Take 30 mg by mouth daily.   Yes Historical Provider, MD  levothyroxine (SYNTHROID, LEVOTHROID) 88 MCG tablet Take 88 mcg by mouth daily before breakfast.  01/14/15  Yes Historical Provider, MD  meclizine (ANTIVERT) 25 MG tablet Take 25 mg by mouth 3 (three)  times daily as needed.   Yes Historical Provider, MD  MYRBETRIQ 25 MG TB24 tablet Take 25 mg by mouth daily. 03/13/14  Yes Historical Provider, MD  oxyCODONE (ROXICODONE) 15 MG immediate release tablet Take 10 mg by mouth 2 (two) times daily.   Yes Historical Provider, MD  TACLONEX external suspension Apply 1 application topically daily. 03/25/14  Yes Historical Provider, MD  traZODone (DESYREL) 50 MG tablet Take 50 mg by mouth at bedtime.  02/28/15  Yes Historical Provider, MD   BP 167/64 mmHg   Pulse 74  Temp(Src) 97.9 F (36.6 C) (Oral)  Resp 21  Ht 5\' 1"  (1.549 m)  Wt 138 lb (62.596 kg)  BMI 26.09 kg/m2  SpO2 93% Physical Exam  Constitutional: She is oriented to person, place, and time. She appears well-developed and well-nourished.  Appears younger than stated age  HENT:  Head: Normocephalic and atraumatic.  Mouth/Throat: Oropharynx is clear and moist.  Eyes: Pupils are equal, round, and reactive to light.  Neck: Neck supple.  Limited range of motion, hold her head mostly towards the left, is able to range but is limited, tenderness palpation over the left trapezius, no midline tenderness to palpation  Cardiovascular: Normal rate, regular rhythm and normal heart sounds.   Pulmonary/Chest: Effort normal and breath sounds normal. No respiratory distress. She has no wheezes.  Neurological: She is alert and oriented to person, place, and time.  5 out of 5 strength with biceps, triceps, grip strength bilaterally  Skin: Skin is warm and dry.  Psychiatric: She has a normal mood and affect.  Nursing note and vitals reviewed.   ED Course  Procedures (including critical care time) Labs Review Labs Reviewed - No data to display  Imaging Review No results found. I have personally reviewed and evaluated these images and lab results as part of my medical decision-making.   EKG Interpretation   Date/Time:  Wednesday February 01 2016 05:02:12 EDT Ventricular Rate:  63 PR Interval:  263 QRS Duration: 92 QT Interval:  408 QTC Calculation: 418 R Axis:   -8 Text Interpretation:  Sinus rhythm Prolonged PR interval Low voltage,  precordial leads Confirmed by Devanee Pomplun  MD, Shamila Lerch ([id number]) on 02/01/2016  7:05:14 AM      MDM   Final diagnoses:  None    Patient presents with acute on chronic neck pain. It appears musculoskeletal in nature. However, given her age, will obtain a screening EKG and obtain a CT scan of her neck to rule out bony pathology. She's neurologically  intact. EKG is reassuring. Patient was given Flexeril for muscle relaxation.  CT scan pending. If reassuring, would favor discharging on a Medrol Dosepak for inflammation and continuing Flexeril at home. Patient may benefit from physical therapy.    Merryl Hacker, MD 02/01/16 [phone]

## 2016-02-01 NOTE — Discharge Instructions (Signed)
Cervical Sprain  A cervical sprain is an injury in the neck in which the strong, fibrous tissues (ligaments) that connect your neck bones stretch or tear. Cervical sprains can range from mild to severe. Severe cervical sprains can cause the neck vertebrae to be unstable. This can lead to damage of the spinal cord and can result in serious nervous system problems. The amount of time it takes for a cervical sprain to get better depends on the cause and extent of the injury. Most cervical sprains heal in 1 to 3 weeks.  CAUSES   Severe cervical sprains may be caused by:    Contact sport injuries (such as from football, rugby, wrestling, hockey, auto racing, gymnastics, diving, martial arts, or boxing).    Motor vehicle collisions.    Whiplash injuries. This is an injury from a sudden forward and backward whipping movement of the head and neck.   Falls.   Mild cervical sprains may be caused by:    Being in an awkward position, such as while cradling a telephone between your ear and shoulder.    Sitting in a chair that does not offer proper support.    Working at a poorly designed computer station.    Looking up or down for long periods of time.   SYMPTOMS    Pain, soreness, stiffness, or a burning sensation in the front, back, or sides of the neck. This discomfort may develop immediately after the injury or slowly, 24 hours or more after the injury.    Pain or tenderness directly in the middle of the back of the neck.    Shoulder or upper back pain.    Limited ability to move the neck.    Headache.    Dizziness.    Weakness, numbness, or tingling in the hands or arms.    Muscle spasms.    Difficulty swallowing or chewing.    Tenderness and swelling of the neck.   DIAGNOSIS   Most of the time your health care provider can diagnose a cervical sprain by taking your history and doing a physical exam. Your health care provider will ask about previous neck injuries and any known neck  problems, such as arthritis in the neck. X-rays may be taken to find out if there are any other problems, such as with the bones of the neck. Other tests, such as a CT scan or MRI, may also be needed.   TREATMENT   Treatment depends on the severity of the cervical sprain. Mild sprains can be treated with rest, keeping the neck in place (immobilization), and pain medicines. Severe cervical sprains are immediately immobilized. Further treatment is done to help with pain, muscle spasms, and other symptoms and may include:   Medicines, such as pain relievers, numbing medicines, or muscle relaxants.    Physical therapy. This may involve stretching exercises, strengthening exercises, and posture training. Exercises and improved posture can help stabilize the neck, strengthen muscles, and help stop symptoms from returning.   HOME CARE INSTRUCTIONS    Put ice on the injured area.     Put ice in a plastic bag.     Place a towel between your skin and the bag.     Leave the ice on for 15-20 minutes, 3-4 times a day.    If your injury was severe, you may have been given a cervical collar to wear. A cervical collar is a two-piece collar designed to keep your neck from moving while it heals.      Do not remove the collar unless instructed by your health care provider.    If you have long hair, keep it outside of the collar.    Ask your health care provider before making any adjustments to your collar. Minor adjustments may be required over time to improve comfort and reduce pressure on your chin or on the back of your head.    Ifyou are allowed to remove the collar for cleaning or bathing, follow your health care provider's instructions on how to do so safely.    Keep your collar clean by wiping it with mild soap and water and drying it completely. If the collar you have been given includes removable pads, remove them every 1-2 days and hand wash them with soap and water. Allow them to air dry. They should be completely  dry before you wear them in the collar.    If you are allowed to remove the collar for cleaning and bathing, wash and dry the skin of your neck. Check your skin for irritation or sores. If you see any, tell your health care provider.    Do not drive while wearing the collar.    Only take over-the-counter or prescription medicines for pain, discomfort, or fever as directed by your health care provider.    Keep all follow-up appointments as directed by your health care provider.    Keep all physical therapy appointments as directed by your health care provider.    Make any needed adjustments to your workstation to promote good posture.    Avoid positions and activities that make your symptoms worse.    Warm up and stretch before being active to help prevent problems.   SEEK MEDICAL CARE IF:    Your pain is not controlled with medicine.    You are unable to decrease your pain medicine over time as planned.    Your activity level is not improving as expected.   SEEK IMMEDIATE MEDICAL CARE IF:    You develop any bleeding.   You develop stomach upset.   You have signs of an allergic reaction to your medicine.    Your symptoms get worse.    You develop new, unexplained symptoms.    You have numbness, tingling, weakness, or paralysis in any part of your body.   MAKE SURE YOU:    Understand these instructions.   Will watch your condition.   Will get help right away if you are not doing well or get worse.     This information is not intended to replace advice given to you by your health care provider. Make sure you discuss any questions you have with your health care provider.     Document Released: 08/05/2007 Document Revised: 10/13/2013 Document Reviewed: 04/15/2013  [employer]vier Interactive Patient Education 2016 [employer]

## 2016-02-01 NOTE — ED Notes (Signed)
Pt. reports chronic left neck pain  /stiffness onset 2 months ago , denies injury ,pain increases with movement .

## 2016-03-26 ENCOUNTER — Ambulatory Visit (HOSPITAL_BASED_OUTPATIENT_CLINIC_OR_DEPARTMENT_OTHER): Payer: Medicare Other | Admitting: Hematology and Oncology

## 2016-03-26 ENCOUNTER — Other Ambulatory Visit: Payer: Self-pay

## 2016-03-26 ENCOUNTER — Other Ambulatory Visit (HOSPITAL_BASED_OUTPATIENT_CLINIC_OR_DEPARTMENT_OTHER): Payer: Medicare Other

## 2016-03-26 ENCOUNTER — Encounter: Payer: Self-pay | Admitting: Hematology and Oncology

## 2016-03-26 VITALS — BP 117/50 | HR 80 | Temp 98.4°F | Resp 18 | Ht 61.0 in | Wt 126.3 lb

## 2016-03-26 DIAGNOSIS — C50411 Malignant neoplasm of upper-outer quadrant of right female breast: Secondary | ICD-10-CM

## 2016-03-26 DIAGNOSIS — Z853 Personal history of malignant neoplasm of breast: Secondary | ICD-10-CM | POA: Diagnosis not present

## 2016-03-26 LAB — COMPREHENSIVE METABOLIC PANEL
ALT: 13 U/L (ref 0–55)
AST: 26 U/L (ref 5–34)
Albumin: 3.9 g/dL (ref 3.5–5.0)
Alkaline Phosphatase: 84 U/L (ref 40–150)
Anion Gap: 8 mEq/L (ref 3–11)
BUN: 15.5 mg/dL (ref 7.0–26.0)
CALCIUM: 9.3 mg/dL (ref 8.4–10.4)
CHLORIDE: 104 meq/L (ref 98–109)
CO2: 30 mEq/L — ABNORMAL HIGH (ref 22–29)
Creatinine: 1 mg/dL (ref 0.6–1.1)
EGFR: 51 mL/min/{1.73_m2} — ABNORMAL LOW (ref >90)
GLUCOSE: 99 mg/dL (ref 70–140)
POTASSIUM: 4.1 meq/L (ref 3.5–5.1)
SODIUM: 141 meq/L (ref 136–145)
Total Bilirubin: 0.73 mg/dL (ref 0.20–1.20)
Total Protein: 6.8 g/dL (ref 6.4–8.3)

## 2016-03-26 LAB — CBC WITH DIFFERENTIAL/PLATELET
BASO%: 0.9 % (ref 0.0–2.0)
BASOS ABS: 0 10*3/uL (ref 0.0–0.1)
EOS%: 2.1 % (ref 0.0–7.0)
Eosinophils Absolute: 0.1 10*3/uL (ref 0.0–0.5)
HCT: 33.8 % — ABNORMAL LOW (ref 34.8–46.6)
HGB: 10.9 g/dL — ABNORMAL LOW (ref 11.6–15.9)
LYMPH%: 17.3 % (ref 14.0–49.7)
MCH: 30.2 pg (ref 25.1–34.0)
MCHC: 32.2 g/dL (ref 31.5–36.0)
MCV: 93.8 fL (ref 79.5–101.0)
MONO#: 0.4 10*3/uL (ref 0.1–0.9)
MONO%: 8 % (ref 0.0–14.0)
NEUT#: 3.7 10*3/uL (ref 1.5–6.5)
NEUT%: 71.7 % (ref 38.4–76.8)
Platelets: 181 10*3/uL (ref 145–400)
RBC: 3.61 10*6/uL — ABNORMAL LOW (ref 3.70–5.45)
RDW: 15.5 % — ABNORMAL HIGH (ref 11.2–14.5)
WBC: 5.1 10*3/uL (ref 3.9–10.3)
lymph#: 0.9 10*3/uL (ref 0.9–3.3)

## 2016-03-26 NOTE — Progress Notes (Signed)
Patient Care Team: Prince Solian, MD as PCP - General (Internal Medicine)  SUMMARY OF ONCOLOGIC HISTORY:   Breast cancer of upper-outer quadrant of right female breast ([place])   06/17/2009 Surgery Right lumpectomy: Invasive mucinous ductal carcinoma stage I   08/15/2009 - 08/31/2009 Radiation Therapy Adjuvant radiation therapy   08/23/2009 - 03/24/2014 Anti-estrogen oral therapy Letrozole 2.5 mg daily   CHIEF COMPLIANT: Surveillance of breast cancer  INTERVAL HISTORY: [name] is a 82 year old with above-mentioned history of right breast cancer treated with lumpectomy radiation and 5 years of antiestrogen therapy with letrozole. She is here for annual follow-up. She reports that her health has been fairly average. She has very hard time with arthritis which she takes 2 pain medications. It is very hard for her to get around. Denies any lumps or nodules in the breasts. We elected to not perform any further mammograms on her.  REVIEW OF SYSTEMS:   Constitutional: Denies fevers, chills or abnormal weight loss Eyes: Denies blurriness of vision Ears, nose, mouth, throat, and face: Denies mucositis or sore throat Respiratory: Denies cough, dyspnea or wheezes Cardiovascular: Denies palpitation, chest discomfort Gastrointestinal:  Denies nausea, heartburn or change in bowel habits Skin: Denies abnormal skin rashes Lymphatics: Denies new lymphadenopathy or easy bruising Neurological:Denies numbness, tingling or new weaknesses Behavioral/Psych: Mood is stable, no new changes  Extremities: Bilateral arthritis with Breast:  denies any pain or lumps or nodules in either breasts All other systems were reviewed with the patient and are negative.  I have reviewed the past medical history, past surgical history, social history and family history with the patient and they are unchanged from previous note.  ALLERGIES:  is allergic to diclofenac sodium; procaine hcl; sulfa antibiotics; vicodin;  novocain; and other.  MEDICATIONS:  Current Outpatient Prescriptions  Medication Sig Dispense Refill  . aspirin 81 MG tablet Take 81 mg by mouth daily.    Marland Kitchen BENICAR 20 MG tablet Take 10 mg by mouth daily.     . cholecalciferol (VITAMIN D) 1000 UNITS tablet Take 1,000 Units by mouth 2 (two) times daily.    . clobetasol (TEMOVATE) 0.05 % external solution Apply 1 application topically daily.     . clonazepam (KLONOPIN) 0.125 MG disintegrating tablet Take 0.125 mg by mouth 2 (two) times daily as needed for seizure.   1  . clotrimazole (LOTRIMIN) 1 % cream Apply topically 2 (two) times daily as needed.     . cyclobenzaprine (FLEXERIL) 5 MG tablet Take 1 tablet (5 mg total) by mouth 3 (three) times daily as needed for muscle spasms. 30 tablet 0  . cycloSPORINE (RESTASIS) 0.05 % ophthalmic emulsion Place 1 drop into both eyes 2 (two) times daily.    Marland Kitchen escitalopram (LEXAPRO) 10 MG tablet Take 10 mg by mouth daily.   11  . fentaNYL (DURAGESIC - DOSED MCG/HR) 50 MCG/HR Place 1 patch onto the skin every other day.     . fluticasone (FLONASE) 50 MCG/ACT nasal spray Place 2 sprays into the nose daily.    . hyoscyamine (ANASPAZ) 0.125 MG TBDP Place 0.125 mg under the tongue every 6 (six) hours as needed for bladder spasms.     . lansoprazole (PREVACID) 30 MG capsule Take 30 mg by mouth daily.    Marland Kitchen levothyroxine (SYNTHROID, LEVOTHROID) 88 MCG tablet Take 88 mcg by mouth daily before breakfast.   2  . meclizine (ANTIVERT) 25 MG tablet Take 25 mg by mouth 3 (three) times daily as needed.    Marland Kitchen  methylPREDNISolone (MEDROL DOSEPAK) 4 MG TBPK tablet Take as directed on packet. 21 tablet 0  . MYRBETRIQ 25 MG TB24 tablet Take 25 mg by mouth daily.    Marland Kitchen oxyCODONE (ROXICODONE) 15 MG immediate release tablet Take 10 mg by mouth 2 (two) times daily.    Donita Brooks external suspension Apply 1 application topically daily.    . traZODone (DESYREL) 50 MG tablet Take 50 mg by mouth at bedtime.   3   No current  facility-administered medications for this visit.    PHYSICAL EXAMINATION: ECOG PERFORMANCE STATUS: 1 - Symptomatic but completely ambulatory  Filed Vitals:   03/26/16 1357  BP: 117/50  Pulse: 80  Temp: 98.4 F (36.9 C)  Resp: 18   Filed Weights   03/26/16 1357  Weight: 126 lb 4.8 oz (57.289 kg)    GENERAL:alert, no distress and comfortable SKIN: skin color, texture, turgor are normal, no rashes or significant lesions EYES: normal, Conjunctiva are pink and non-injected, sclera clear OROPHARYNX:no exudate, no erythema and lips, buccal mucosa, and tongue normal  NECK: supple, thyroid normal size, non-tender, without nodularity LYMPH:  no palpable lymphadenopathy in the cervical, axillary or inguinal LUNGS: clear to auscultation and percussion with normal breathing effort HEART: Pansystolic murmur in the mitral area ABDOMEN:abdomen soft, non-tender and normal bowel sounds MUSCULOSKELETAL:no cyanosis of digits and no clubbing  NEURO: alert & oriented x 3 with fluent speech, no focal motor/sensory deficits EXTREMITIES: No lower extremity edema BREAST: No palpable masses or nodules in either right or left breasts. No palpable axillary supraclavicular or infraclavicular adenopathy no breast tenderness or nipple discharge. (exam performed in the presence of a chaperone)  LABORATORY DATA:  I have reviewed the data as listed   Chemistry      Component Value Date/Time   NA 141 02/01/2016 1000   NA 141 03/25/2015 1037   K 3.8 02/01/2016 1000   K 4.2 03/25/2015 1037   CL 101 02/01/2016 1000   CL 104 12/25/2012 1244   CO2 27 03/25/2015 1037   CO2 24 11/16/2011 1341   BUN 8 02/01/2016 1000   BUN 15.5 03/25/2015 1037   CREATININE 1.00 02/01/2016 1000   CREATININE 1.2* 03/25/2015 1037      Component Value Date/Time   CALCIUM 9.3 03/25/2015 1037   CALCIUM 9.1 11/16/2011 1341   ALKPHOS 79 03/25/2015 1037   ALKPHOS 82 11/16/2011 1341   AST 21 03/25/2015 1037   AST 19 11/16/2011  1341   ALT 8 03/25/2015 1037   ALT 15 11/16/2011 1341   BILITOT 0.53 03/25/2015 1037   BILITOT 0.6 11/16/2011 1341       Lab Results  Component Value Date   WBC 5.2 03/25/2015   HGB 12.2 02/01/2016   HCT 36.0 02/01/2016   MCV 92.6 03/25/2015   PLT 219 03/25/2015   NEUTROABS 3.1 03/25/2015     ASSESSMENT & PLAN:  Breast cancer of upper-outer quadrant of right female breast ([place]) invasive ductal carcinoma with mucinous features originally diagnosed in July 2010 tumor was ER/PR positive HER-2/neu negative. It 06/18/1999 and she underwent a right lumpectomy. This was then followed by radiation therapy from 08/04/2009 to 08/31/2009. She then received adjuvant letrozole. She is tolerating it well. She completed a total of 5 years in 08/2014  Breast Cancer Surveillance: 1. Breast exam 03/26/2016: Normal 2. Mammogram 10/11/2014 No abnormalities. Postsurgical changes. Breast Density Category C.  Since the patient is 82 years old I discussed with her about the role of mammograms. I  agree with not getting any further mammograms   I recommended follow-up as needed   No orders of the defined types were placed in this encounter.   The patient has a good understanding of the overall plan. she agrees with it. she will call with any problems that may develop before the next visit here.   Rulon Eisenmenger, MD 03/26/2016

## 2016-03-26 NOTE — Assessment & Plan Note (Signed)
invasive ductal carcinoma with mucinous features originally diagnosed in July 2010 tumor was ER/PR positive HER-2/neu negative. It 06/18/1999 and she underwent a right lumpectomy. This was then followed by radiation therapy from 08/04/2009 to 08/31/2009. She then received adjuvant letrozole. She is tolerating it well. She completed a total of 5 years in 08/2014  Breast Cancer Surveillance: 1. Breast exam 03/26/2016: Normal 2. Mammogram 10/11/2014 No abnormalities. Postsurgical changes. Breast Density Category C.  Since the patient is 82 years old I discussed with her about the role of mammograms. I agree with not getting any further mammograms   I recommended follow-up as needed

## 2016-11-23 ENCOUNTER — Ambulatory Visit (INDEPENDENT_AMBULATORY_CARE_PROVIDER_SITE_OTHER): Payer: Medicare Other | Admitting: Orthopaedic Surgery

## 2017-01-13 ENCOUNTER — Emergency Department (HOSPITAL_COMMUNITY): Payer: Medicare Other

## 2017-01-13 ENCOUNTER — Observation Stay (HOSPITAL_COMMUNITY)
Admission: EM | Admit: 2017-01-13 | Discharge: 2017-01-14 | Disposition: A | Discharge status: Home / Self Care | Payer: Medicare Other | Attending: Internal Medicine | Admitting: Internal Medicine

## 2017-01-13 ENCOUNTER — Encounter (HOSPITAL_COMMUNITY): Payer: Self-pay | Admitting: Radiology

## 2017-01-13 DIAGNOSIS — E039 Hypothyroidism, unspecified: Secondary | ICD-10-CM | POA: Insufficient documentation

## 2017-01-13 DIAGNOSIS — F0390 Unspecified dementia without behavioral disturbance: Secondary | ICD-10-CM | POA: Insufficient documentation

## 2017-01-13 DIAGNOSIS — M79606 Pain in leg, unspecified: Secondary | ICD-10-CM | POA: Diagnosis present

## 2017-01-13 DIAGNOSIS — F329 Major depressive disorder, single episode, unspecified: Secondary | ICD-10-CM | POA: Insufficient documentation

## 2017-01-13 DIAGNOSIS — Z79891 Long term (current) use of opiate analgesic: Secondary | ICD-10-CM | POA: Diagnosis not present

## 2017-01-13 DIAGNOSIS — Z7982 Long term (current) use of aspirin: Secondary | ICD-10-CM | POA: Diagnosis not present

## 2017-01-13 DIAGNOSIS — J942 Hemothorax: Secondary | ICD-10-CM | POA: Insufficient documentation

## 2017-01-13 DIAGNOSIS — M549 Dorsalgia, unspecified: Secondary | ICD-10-CM | POA: Diagnosis present

## 2017-01-13 DIAGNOSIS — G8929 Other chronic pain: Secondary | ICD-10-CM | POA: Insufficient documentation

## 2017-01-13 DIAGNOSIS — F419 Anxiety disorder, unspecified: Secondary | ICD-10-CM | POA: Insufficient documentation

## 2017-01-13 DIAGNOSIS — Z853 Personal history of malignant neoplasm of breast: Secondary | ICD-10-CM | POA: Insufficient documentation

## 2017-01-13 DIAGNOSIS — Z96659 Presence of unspecified artificial knee joint: Secondary | ICD-10-CM | POA: Insufficient documentation

## 2017-01-13 DIAGNOSIS — D649 Anemia, unspecified: Secondary | ICD-10-CM | POA: Diagnosis not present

## 2017-01-13 DIAGNOSIS — S270XXA Traumatic pneumothorax, initial encounter: Secondary | ICD-10-CM

## 2017-01-13 DIAGNOSIS — S2239XA Fracture of one rib, unspecified side, initial encounter for closed fracture: Secondary | ICD-10-CM

## 2017-01-13 DIAGNOSIS — S2241XA Multiple fractures of ribs, right side, initial encounter for closed fracture: Secondary | ICD-10-CM | POA: Diagnosis not present

## 2017-01-13 DIAGNOSIS — C50411 Malignant neoplasm of upper-outer quadrant of right female breast: Secondary | ICD-10-CM | POA: Diagnosis present

## 2017-01-13 DIAGNOSIS — Z79899 Other long term (current) drug therapy: Secondary | ICD-10-CM | POA: Diagnosis not present

## 2017-01-13 DIAGNOSIS — W19XXXA Unspecified fall, initial encounter: Secondary | ICD-10-CM | POA: Diagnosis not present

## 2017-01-13 DIAGNOSIS — S2249XA Multiple fractures of ribs, unspecified side, initial encounter for closed fracture: Secondary | ICD-10-CM

## 2017-01-13 LAB — CBC WITH DIFFERENTIAL/PLATELET
Basophils Absolute: 0 10*3/uL (ref 0.0–0.1)
Basophils Relative: 0 %
Eosinophils Absolute: 0.1 10*3/uL (ref 0.0–0.7)
Eosinophils Relative: 1 %
HEMATOCRIT: 34.9 % — AB (ref 36.0–46.0)
HEMOGLOBIN: 11.4 g/dL — AB (ref 12.0–15.0)
LYMPHS PCT: 11 %
Lymphs Abs: 1.1 10*3/uL (ref 0.7–4.0)
MCH: 31.7 pg (ref 26.0–34.0)
MCHC: 32.7 g/dL (ref 30.0–36.0)
MCV: 96.9 fL (ref 78.0–100.0)
MONOS PCT: 9 %
Monocytes Absolute: 0.9 10*3/uL (ref 0.1–1.0)
NEUTROS PCT: 79 %
Neutro Abs: 7.8 10*3/uL — ABNORMAL HIGH (ref 1.7–7.7)
Platelets: 223 10*3/uL (ref 150–400)
RBC: 3.6 MIL/uL — AB (ref 3.87–5.11)
RDW: 15.5 % (ref 11.5–15.5)
WBC: 9.9 10*3/uL (ref 4.0–10.5)

## 2017-01-13 LAB — PROTIME-INR
INR: 0.98
PROTHROMBIN TIME: 13 s (ref 11.4–15.2)

## 2017-01-13 LAB — URINALYSIS, ROUTINE W REFLEX MICROSCOPIC
BACTERIA UA: NONE SEEN
BILIRUBIN URINE: NEGATIVE
GLUCOSE, UA: NEGATIVE mg/dL
KETONES UR: NEGATIVE mg/dL
LEUKOCYTES UA: NEGATIVE
NITRITE: NEGATIVE
PH: 7 (ref 5.0–8.0)
Protein, ur: NEGATIVE mg/dL
Specific Gravity, Urine: 1.009 (ref 1.005–1.030)
Squamous Epithelial / LPF: NONE SEEN

## 2017-01-13 LAB — COMPREHENSIVE METABOLIC PANEL
ALK PHOS: 86 U/L (ref 38–126)
ALT: 13 U/L — AB (ref 14–54)
AST: 31 U/L (ref 15–41)
Albumin: 4.1 g/dL (ref 3.5–5.0)
Anion gap: 8 (ref 5–15)
BILIRUBIN TOTAL: 0.8 mg/dL (ref 0.3–1.2)
BUN: 13 mg/dL (ref 6–20)
CALCIUM: 9.4 mg/dL (ref 8.9–10.3)
CO2: 30 mmol/L (ref 22–32)
CREATININE: 0.93 mg/dL (ref 0.44–1.00)
Chloride: 99 mmol/L — ABNORMAL LOW (ref 101–111)
GFR, EST AFRICAN AMERICAN: 58 mL/min — AB (ref >60)
GFR, EST NON AFRICAN AMERICAN: 50 mL/min — AB (ref >60)
Glucose, Bld: 103 mg/dL — ABNORMAL HIGH (ref 65–99)
Potassium: 4.2 mmol/L (ref 3.5–5.1)
Sodium: 137 mmol/L (ref 135–145)
Total Protein: 7.3 g/dL (ref 6.5–8.1)

## 2017-01-13 LAB — CK: Total CK: 225 U/L (ref 38–234)

## 2017-01-13 LAB — I-STAT TROPONIN, ED: Troponin i, poc: 0 ng/mL (ref 0.00–0.08)

## 2017-01-13 LAB — I-STAT CG4 LACTIC ACID, ED: LACTIC ACID, VENOUS: 0.95 mmol/L (ref 0.5–1.9)

## 2017-01-13 LAB — BRAIN NATRIURETIC PEPTIDE: B Natriuretic Peptide: 112.7 pg/mL — ABNORMAL HIGH (ref 0.0–100.0)

## 2017-01-13 MED ORDER — OXYCODONE HCL 5 MG PO TABS
10.0000 mg | ORAL_TABLET | Freq: Two times a day (BID) | ORAL | Status: DC | PRN
  Administered 2017-01-14: 10 mg via ORAL
  Filled 2017-01-13: qty 2

## 2017-01-13 MED ORDER — IOPAMIDOL (ISOVUE-300) INJECTION 61%: 75.0000 mL | Freq: Once | INTRAVENOUS | Status: DC | PRN

## 2017-01-13 MED ORDER — LEVOTHYROXINE SODIUM 88 MCG PO TABS
88.0000 ug | ORAL_TABLET | Freq: Every day | ORAL | Status: DC
  Administered 2017-01-14: 88 ug via ORAL
  Filled 2017-01-13: qty 1

## 2017-01-13 MED ORDER — VITAMIN D3 25 MCG (1000 UNIT) PO TABS
5000.0000 [IU] | ORAL_TABLET | Freq: Every day | ORAL | Status: DC
  Administered 2017-01-14: 5000 [IU] via ORAL
  Filled 2017-01-13 (×2): qty 5

## 2017-01-13 MED ORDER — ESCITALOPRAM OXALATE 10 MG PO TABS
10.0000 mg | ORAL_TABLET | Freq: Every day | ORAL | Status: DC
  Administered 2017-01-14: 10 mg via ORAL
  Filled 2017-01-13: qty 1

## 2017-01-13 MED ORDER — FENTANYL 25 MCG/HR TD PT72: 50.0000 ug | MEDICATED_PATCH | TRANSDERMAL | Status: DC

## 2017-01-13 MED ORDER — OXYCODONE HCL 5 MG PO TABS
5.0000 mg | ORAL_TABLET | Freq: Once | ORAL | Status: AC
  Administered 2017-01-13: 5 mg via ORAL
  Filled 2017-01-13: qty 1

## 2017-01-13 MED ORDER — DONEPEZIL HCL 5 MG PO TABS
5.0000 mg | ORAL_TABLET | Freq: Every evening | ORAL | Status: DC
  Administered 2017-01-13 – 2017-01-14 (×2): 5 mg via ORAL
  Filled 2017-01-13 (×2): qty 1

## 2017-01-13 MED ORDER — TRAZODONE HCL 50 MG PO TABS
50.0000 mg | ORAL_TABLET | Freq: Every day | ORAL | Status: DC
  Administered 2017-01-13: 50 mg via ORAL
  Filled 2017-01-13: qty 1

## 2017-01-13 MED ORDER — IOPAMIDOL (ISOVUE-300) INJECTION 61%
INTRAVENOUS | Status: AC
  Administered 2017-01-13: 75 mL via INTRAVENOUS
  Administered 2017-01-13: 10:00:00
  Filled 2017-01-13: qty 100

## 2017-01-13 MED ORDER — HYDROMORPHONE HCL 2 MG PO TABS: 2.0000 mg | ORAL_TABLET | Freq: Two times a day (BID) | ORAL | Status: DC | PRN

## 2017-01-13 MED ORDER — SODIUM CHLORIDE 0.9% FLUSH
3.0000 mL | Freq: Two times a day (BID) | INTRAVENOUS | Status: DC
  Administered 2017-01-13 – 2017-01-14 (×3): 3 mL via INTRAVENOUS

## 2017-01-13 MED ORDER — FENTANYL 50 MCG/HR TD PT72
50.0000 ug | MEDICATED_PATCH | TRANSDERMAL | Status: DC
  Administered 2017-01-14: 50 ug via TRANSDERMAL
  Filled 2017-01-13: qty 1

## 2017-01-13 MED ORDER — MIRABEGRON ER 25 MG PO TB24
25.0000 mg | ORAL_TABLET | ORAL | Status: DC
  Administered 2017-01-14: 25 mg via ORAL
  Filled 2017-01-13: qty 1

## 2017-01-13 MED ORDER — MOMETASONE FURO-FORMOTEROL FUM 200-5 MCG/ACT IN AERO
2.0000 | INHALATION_SPRAY | Freq: Two times a day (BID) | RESPIRATORY_TRACT | Status: DC
  Administered 2017-01-13 – 2017-01-14 (×2): 2 via RESPIRATORY_TRACT
  Filled 2017-01-13: qty 8.8

## 2017-01-13 MED ORDER — IRBESARTAN 150 MG PO TABS
150.0000 mg | ORAL_TABLET | Freq: Every day | ORAL | Status: DC
  Administered 2017-01-14: 150 mg via ORAL
  Filled 2017-01-13: qty 1

## 2017-01-13 MED ORDER — CLONAZEPAM 0.5 MG PO TABS
0.2500 mg | ORAL_TABLET | Freq: Two times a day (BID) | ORAL | Status: DC | PRN
  Administered 2017-01-13: 0.25 mg via ORAL
  Filled 2017-01-13: qty 1

## 2017-01-13 MED ORDER — METHOCARBAMOL 500 MG PO TABS: 500.0000 mg | ORAL_TABLET | Freq: Three times a day (TID) | ORAL | Status: DC | PRN

## 2017-01-13 NOTE — Consult Note (Signed)
Reason for Consult:  Rib fractures, small hemothorax Referring Physician: Dr. Alicia Amel is an 83 y.o. female.  HPI:  This is a 83 year old female who fell very early this morning. She complained of severe chest wall pain on the right side after the fall. Her daughter is with her and gives the history. Apparently she's been having a number of falls recently. She presented to the emergency department for evaluation. She has a lot of pain in the right chest with movement. She denies any neck pain and headache or abdominal pain. Her evaluation in the emergency room demonstrated a right seventh and eighth rib fracture with small amount of pleural fluid consistent with small hemothorax. There is a tiny pneumothorax on the right side only seen on CT scan and not on chest x-ray. She also has a tiny right transverse process fracture at T8.  Past Medical History:  Diagnosis Date  . Arthritis     Past Surgical History:  Procedure Laterality Date  . CARPAL TUNNEL RELEASE    . REPLACEMENT TOTAL KNEE      No family history on file.  Social History:  reports that she has never smoked. She has never used smokeless tobacco. Her alcohol and drug histories are not on file.  Allergies:  Allergies  Allergen Reactions  . Diclofenac Sodium   . Procaine Hcl   . Sulfa Antibiotics   . Vicodin [Hydrocodone-Acetaminophen]   . Novocain [Procaine]     Per patient   . Other     Percandan, coraseden and voltairn (per patient), patient not sure what reaction was.    Prior to Admission medications   Medication Sig Start Date End Date Taking? Authorizing Provider  aspirin 81 MG tablet Take 81 mg by mouth daily.   Yes Historical Provider, MD  BENICAR 20 MG tablet Take 20 mg by mouth daily.  08/23/12  Yes Historical Provider, MD  cholecalciferol (VITAMIN D) 1000 UNITS tablet Take 5,000 Units by mouth daily.    Yes Historical Provider, MD  clonazePAM (KLONOPIN) 0.25 MG disintegrating tablet Take 0.25  mg by mouth 2 (two) times daily. 12/13/16  Yes Historical Provider, MD  donepezil (ARICEPT) 5 MG tablet Take 5 mg by mouth every evening. With food 12/07/16  Yes Historical Provider, MD  escitalopram (LEXAPRO) 10 MG tablet Take 10 mg by mouth daily.  02/22/15  Yes Historical Provider, MD  fentaNYL (DURAGESIC - DOSED MCG/HR) 50 MCG/HR Place 1 patch onto the skin every other day.    Yes Historical Provider, MD  fluocinonide cream (LIDEX) 2.99 % Apply 1 application topically 2 (two) times daily.   Yes Historical Provider, MD  Fluticasone-Salmeterol (ADVAIR DISKUS) 250-50 MCG/DOSE AEPB Inhale 2 puffs into the lungs daily as needed.   Yes Historical Provider, MD  folic acid (FOLVITE) 1 MG tablet Take 1 mg by mouth daily. 01/08/17  Yes Historical Provider, MD  HYDROmorphone (DILAUDID) 2 MG tablet Take 2 mg by mouth 2 (two) times daily as needed for severe pain.   Yes Historical Provider, MD  levothyroxine (SYNTHROID, LEVOTHROID) 88 MCG tablet Take 88 mcg by mouth daily before breakfast.  01/14/15  Yes Historical Provider, MD  methotrexate (RHEUMATREX) 2.5 MG tablet Take 12.5 mg by mouth every 7 (seven) days.  12/12/16  Yes Historical Provider, MD  MYRBETRIQ 25 MG TB24 tablet Take 25 mg by mouth every other day.  03/13/14  Yes Historical Provider, MD  Oxycodone HCl 10 MG TABS Take 10 mg by mouth 3 (  three) times daily.   Yes Historical Provider, MD  traZODone (DESYREL) 50 MG tablet Take 50 mg by mouth at bedtime.  02/28/15  Yes Historical Provider, MD  cyclobenzaprine (FLEXERIL) 5 MG tablet Take 1 tablet (5 mg total) by mouth 3 (three) times daily as needed for muscle spasms. Patient not taking: Reported on 01/13/2017 02/01/16   Merryl Hacker, MD  methylPREDNISolone (MEDROL DOSEPAK) 4 MG TBPK tablet Take as directed on packet. Patient not taking: Reported on 01/13/2017 02/01/16   Merryl Hacker, MD     Results for orders placed or performed during the hospital encounter of 01/13/17 (from the past 48 hour(s))   CBC with Differential     Status: Abnormal   Collection Time: 01/13/17 10:00 AM  Result Value Ref Range   WBC 9.9 4.0 - 10.5 K/uL   RBC 3.60 (L) 3.87 - 5.11 MIL/uL   Hemoglobin 11.4 (L) 12.0 - 15.0 g/dL   HCT 34.9 (L) 36.0 - 46.0 %   MCV 96.9 78.0 - 100.0 fL   MCH 31.7 26.0 - 34.0 pg   MCHC 32.7 30.0 - 36.0 g/dL   RDW 15.5 11.5 - 15.5 %   Platelets 223 150 - 400 K/uL   Neutrophils Relative % 79 %   Neutro Abs 7.8 (H) 1.7 - 7.7 K/uL   Lymphocytes Relative 11 %   Lymphs Abs 1.1 0.7 - 4.0 K/uL   Monocytes Relative 9 %   Monocytes Absolute 0.9 0.1 - 1.0 K/uL   Eosinophils Relative 1 %   Eosinophils Absolute 0.1 0.0 - 0.7 K/uL   Basophils Relative 0 %   Basophils Absolute 0.0 0.0 - 0.1 K/uL  Comprehensive metabolic panel     Status: Abnormal   Collection Time: 01/13/17 10:00 AM  Result Value Ref Range   Sodium 137 135 - 145 mmol/L   Potassium 4.2 3.5 - 5.1 mmol/L   Chloride 99 (L) 101 - 111 mmol/L   CO2 30 22 - 32 mmol/L   Glucose, Bld 103 (H) 65 - 99 mg/dL   BUN 13 6 - 20 mg/dL   Creatinine, Ser 0.93 0.44 - 1.00 mg/dL   Calcium 9.4 8.9 - 10.3 mg/dL   Total Protein 7.3 6.5 - 8.1 g/dL   Albumin 4.1 3.5 - 5.0 g/dL   AST 31 15 - 41 U/L   ALT 13 (L) 14 - 54 U/L   Alkaline Phosphatase 86 38 - 126 U/L   Total Bilirubin 0.8 0.3 - 1.2 mg/dL   GFR calc non Af Amer 50 (L) >60 mL/min   GFR calc Af Amer 58 (L) >60 mL/min    Comment: (NOTE) The eGFR has been calculated using the CKD EPI equation. This calculation has not been validated in all clinical situations. eGFR's persistently <60 mL/min signify possible Chronic Kidney Disease.    Anion gap 8 5 - 15  Protime-INR     Status: None   Collection Time: 01/13/17 10:00 AM  Result Value Ref Range   Prothrombin Time 13.0 11.4 - 15.2 seconds   INR 0.98   Brain natriuretic peptide     Status: Abnormal   Collection Time: 01/13/17 10:00 AM  Result Value Ref Range   B Natriuretic Peptide 112.7 (H) 0.0 - 100.0 pg/mL  CK     Status:  None   Collection Time: 01/13/17 10:00 AM  Result Value Ref Range   Total CK 225 38 - 234 U/L  I-Stat Troponin, ED (not at [facility])  Status: None   Collection Time: 01/13/17 10:19 AM  Result Value Ref Range   Troponin i, poc 0.00 0.00 - 0.08 ng/mL   Comment 3            Comment: Due to the release kinetics of cTnI, a negative result within the first hours of the onset of symptoms does not rule out myocardial infarction with certainty. If myocardial infarction is still suspected, repeat the test at appropriate intervals.   I-Stat CG4 Lactic Acid, ED     Status: None   Collection Time: 01/13/17 10:20 AM  Result Value Ref Range   Lactic Acid, Venous 0.95 0.5 - 1.9 mmol/L  Urinalysis, Routine w reflex microscopic     Status: Abnormal   Collection Time: 01/13/17 10:29 AM  Result Value Ref Range   Color, Urine YELLOW YELLOW   APPearance CLEAR CLEAR   Specific Gravity, Urine 1.009 1.005 - 1.030   pH 7.0 5.0 - 8.0   Glucose, UA NEGATIVE NEGATIVE mg/dL   Hgb urine dipstick SMALL (A) NEGATIVE   Bilirubin Urine NEGATIVE NEGATIVE   Ketones, ur NEGATIVE NEGATIVE mg/dL   Protein, ur NEGATIVE NEGATIVE mg/dL   Nitrite NEGATIVE NEGATIVE   Leukocytes, UA NEGATIVE NEGATIVE   RBC / HPF 0-5 0 - 5 RBC/hpf   WBC, UA 0-5 0 - 5 WBC/hpf   Bacteria, UA NONE SEEN NONE SEEN   Squamous Epithelial / LPF NONE SEEN NONE SEEN    Dg Chest 2 View  Result Date: 01/13/2017 CLINICAL DATA:  Unwitnessed fall.  Found on floor.  Dementia. EXAM: CHEST  2 VIEW COMPARISON:  Chest radiograph 10/31/2010. FINDINGS: Cardiomediastinal silhouette is increased. No active infiltrates or failure. Prior BILATERAL shoulder surgery. No visible acute rib fracture or pneumothorax. Degenerative change in the thoracic spine without definite acute compression deformity. IMPRESSION: Cardiomegaly. No definite active infiltrates or failure. No visible acute compression fracture. Electronically Signed   By: Staci Righter M.D.   On:  01/13/2017 09:39   Ct Head Wo Contrast  Result Date: 01/13/2017 CLINICAL DATA:  Rib pain after a fall. EXAM: CT HEAD WITHOUT CONTRAST CT CERVICAL SPINE WITHOUT CONTRAST TECHNIQUE: Multidetector CT imaging of the head and cervical spine was performed following the standard protocol without intravenous contrast. Multiplanar CT image reconstructions of the cervical spine were also generated. COMPARISON:  CT neck 02/01/2016. FINDINGS: CT HEAD FINDINGS Brain: No evidence for acute infarction, hemorrhage, mass lesion, hydrocephalus, or extra-axial fluid. Global atrophy. Hypoattenuation of white matter suggesting small vessel disease. Old LEFT occipital infarct. Vascular: No intracranial hyperdense vessel to suggest acute thrombosis. BILATERAL cavernous carotid and distal vertebral calcification, not unexpected for age. Skull: No skull fracture.  No worrisome osseous lesion. Sinuses/Orbits: BILATERAL cataract extraction. No layering sinus fluid. Other: None. CT CERVICAL SPINE FINDINGS Alignment: Degenerative anterolisthesis C3-C4. Chronic anterolisthesis of the odontoid on body of CTA related to nonunion. Previous posterior fusion. Skull base and vertebrae: There is a chronic ununited type 2 odontoid fracture similar to 2017. Chronic C1 arch fractures are also stable. Posterior cerclage wire at C1-C2 appears similar. There has been previous C5-C7 ACDF with anterior plating without adverse features. No acute fracture is seen. Soft tissues and spinal canal: No prevertebral soft tissue swelling. No intraspinal hematoma. Mild central canal stenosis may be present C5-C6. Atherosclerosis. Nuchal ligament calcification. Disc levels:  Multilevel spondylosis. Upper chest: Biapical scarring. Tiny RIGHT pneumothorax. No rib fracture can be seen. Other: None. Compared with 2017, the C1 and odontoid nonunions have a similar appearance.  No pneumothorax was present in 2017. IMPRESSION: No skull fracture or intracranial hemorrhage.  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 Posterior cerclage wire fusion appears solid. Tiny RIGHT apical pneumothorax. Biapical scarring appears similar. If further investigation desired, recommend CT chest without contrast. Critical Value/emergent results were called by telephone at the time of interpretation on 01/13/2017 at 9:58 am to Dr. Marda Stalker , who verbally acknowledged these results. Electronically Signed   By: Staci Righter M.D.   On: 01/13/2017 10:05   Ct Chest W Contrast  Result Date: 01/13/2017 CLINICAL DATA:  Unwitnessed fall.  RIGHT-sided chest pain. EXAM: CT CHEST WITH CONTRAST TECHNIQUE: Multidetector CT imaging of the chest was performed during intravenous contrast administration. CONTRAST:  1 ISOVUE-300 IOPAMIDOL (ISOVUE-300) INJECTION 61% COMPARISON:  Chest radiograph earlier today. CT cervical spine earlier today. CT abdomen and pelvis 06/03/2013. FINDINGS: Cardiovascular: No significant vascular findings. Cardiomegaly. Mild coronary artery calcification. Mediastinum/Nodes: No hilar adenopathy. Pretracheal nodal enlargement up to 12 mm, stable from priors. No paratracheal or AP window adenopathy. Lungs/Pleura: RIGHT pleural effusion/hemothorax. Small RIGHT apical pneumothorax noted on CT cervical spine has resolved. There are tiny bubbles of air in the RIGHT effusion and in the RIGHT posterior soft tissues. Biapical pleural thickening. BILATERAL pleural plaques similar to prior chest CT, likely post XRT findings. Upper Abdomen: Post cholecystectomy appearance with biliary ductal dilatation similar to prior CT. Musculoskeletal: Displaced posterior RIGHT eighth rib fracture, reference image 93 series 5. Nondisplaced posterolateral RIGHT seventh rib fracture. Tiny nondisplaced RIGHT transverse process fracture at T8. Surgically replaced LEFT humeral head. Severe degenerative change RIGHT shoulder. IMPRESSION: There is a small RIGHT hemothorax  related to displaced RIGHT eighth rib fracture. This rib fracture cannot clearly be identified on prior chest radiograph. No significant pneumothorax. A small apical pneumothorax noted on CT cervical spine and resolved. A few bubbles of air can be seen in the RIGHT pleural fluid as well as in the soft tissues of the back. Nondisplaced RIGHT seventh rib fracture and nondisplaced RIGHT T8 transverse process fracture. Electronically Signed   By: Staci Righter M.D.   On: 01/13/2017 11:47   Ct Cervical Spine Wo Contrast  Result Date: 01/13/2017 CLINICAL DATA:  Rib pain after a fall. EXAM: CT HEAD WITHOUT CONTRAST CT CERVICAL SPINE WITHOUT CONTRAST TECHNIQUE: Multidetector CT imaging of the head and cervical spine was performed following the standard protocol without intravenous contrast. Multiplanar CT image reconstructions of the cervical spine were also generated. COMPARISON:  CT neck 02/01/2016. FINDINGS: CT HEAD FINDINGS Brain: No evidence for acute infarction, hemorrhage, mass lesion, hydrocephalus, or extra-axial fluid. Global atrophy. Hypoattenuation of white matter suggesting small vessel disease. Old LEFT occipital infarct. Vascular: No intracranial hyperdense vessel to suggest acute thrombosis. BILATERAL cavernous carotid and distal vertebral calcification, not unexpected for age. Skull: No skull fracture.  No worrisome osseous lesion. Sinuses/Orbits: BILATERAL cataract extraction. No layering sinus fluid. Other: None. CT CERVICAL SPINE FINDINGS Alignment: Degenerative anterolisthesis C3-C4. Chronic anterolisthesis of the odontoid on body of CTA related to nonunion. Previous posterior fusion. Skull base and vertebrae: There is a chronic ununited type 2 odontoid fracture similar to 2017. Chronic C1 arch fractures are also stable. Posterior cerclage wire at C1-C2 appears similar. There has been previous C5-C7 ACDF with anterior plating without adverse features. No acute fracture is seen. Soft tissues and  spinal canal: No prevertebral soft tissue swelling. No intraspinal hematoma. Mild central canal stenosis may be present C5-C6. Atherosclerosis. Nuchal ligament calcification. Disc levels:  Multilevel spondylosis. Upper chest: Biapical scarring. Tiny RIGHT pneumothorax. No rib fracture can be seen. Other: None. Compared with 2017, the C1 and odontoid nonunions have a similar appearance. No pneumothorax was present in 2017. IMPRESSION: No skull fracture or intracranial hemorrhage. 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 Posterior cerclage wire fusion appears solid. Tiny RIGHT apical pneumothorax. Biapical scarring appears similar. If further investigation desired, recommend CT chest without contrast. Critical Value/emergent results were called by telephone at the time of interpretation on 01/13/2017 at 9:58 am to Dr. Marda Stalker , who verbally acknowledged these results. Electronically Signed   By: Staci Righter M.D.   On: 01/13/2017 10:05   Dg Hand Complete Right  Result Date: 01/13/2017 CLINICAL DATA:  RIGHT hand pain diffusely.  Unwitnessed fall. EXAM: RIGHT HAND - COMPLETE 3+ VIEW COMPARISON:  None. FINDINGS: Severe degenerative change at the radiocarpal, intercarpal, carpometacarpal, and metacarpophalangeal joints. Osteopenia. No definite fracture. No dislocation. No significant soft tissue swelling or foreign body. IMPRESSION: No definite acute findings.  Advanced degenerative change. Electronically Signed   By: Staci Righter M.D.   On: 01/13/2017 09:40    Review of Systems  Unable to perform ROS: Mental acuity   Blood pressure (!) 141/60, pulse 70, temperature 97.6 F (36.4 C), resp. rate 17, SpO2 90 %. Physical Exam  Constitutional:  Elderly female who appears to be in no acute distress.  HENT:  Head: Normocephalic and atraumatic.  Eyes: EOM are normal. No scleral icterus.  Neck: Neck supple.  No cervical spine tenderness.  Cardiovascular:  Normal rate and regular rhythm.   Respiratory: Effort normal and breath sounds normal.  Right lateral chest wall tenderness.  GI: Soft. There is no tenderness.  Musculoskeletal:  Swelling right long finger was some ecchymosis  Right knee scar  Neurological: She is alert.  Oriented to name only.  Skin: Skin is warm and dry.    Assessment/Plan: Fall with following injuries: 1.  Right seventh and eighth rib fracture with tiny pneumothorax only seen on CT scan and small right hemothorax.  2. Tiny T8 transverse process fracture.  3.  Multiple recent falls.  Recommend: Transferred to [facility] and that admission by medical service. Trauma service can then continue consulting on her. Recommend incentive spirometry, pain control.  Henryk Ursin J 01/13/2017, 2:09 PM

## 2017-01-13 NOTE — ED Provider Notes (Signed)
[facility] DEPT Provider Note   CSN: [id number] Arrival date & time: 01/13/17  0759     History   Chief Complaint Chief Complaint  Patient presents with  . Fall    HPI [name] is a 83 y.o. female with a past medical history significant for arthritis and breast cancer who presents with central back pain, right-sided chest pain, neck pain, and mild headache after a fall. Patient is accompanied by daughter who reports that patient has fallen weekly for the last month. Patient reportedly has fallen multiple times and has rib fractures on the right side. 3 weeks ago, the patient fell causing pain in her right hand which was never evaluated. Patient says that pain has improved but she still has some swelling of the right middle finger. Patient is right-handed. According to daughter, patient had a fall overnight at around 2:30 AM. The fall was not witnessed but someone reportedly heard her fall and helped get her up. The patient does not remember the fall was her circumstances. Patient fell out of her chair that she sleeps in because she cannot lay flat due to shortness of breath. Patient also has chronic lower extremity bilateral edema but says she has never been diagnosed with CHF and has never taken diuretics. Patient says that she is having pain in the left side of her neck and in the left occipital head. Patient denies chest pain on the front and denies abdominal pain. She does report pain with deep inspiration on the right chest. She has no shortness of breath. Patient has no lower extremity pain or hip pain. Next  Patient denies taking blood thinners but does take fentanyl patches for chronic arthritis pain.  Patient denies any recent fevers, chills, nausea, vomiting, change in urination, constipation, or diarrhea. Patient lives at home with her husband who is traveling currently. She uses a walker for ambulation.    The history is provided by the patient, medical records and a  relative. No language interpreter was used.  Fall  This is a recurrent problem. The current episode started 6 to 12 hours ago. The problem occurs every several days (weekly). The problem has not changed since onset.Associated symptoms include chest pain and headaches. Pertinent negatives include no abdominal pain and no shortness of breath. The symptoms are aggravated by twisting and bending. Nothing relieves the symptoms. She has tried nothing for the symptoms. The treatment provided no relief.    Past Medical History:  Diagnosis Date  . Arthritis     Patient Active Problem List   Diagnosis Date Noted  . Plantar fasciitis of right foot 12/31/2014  . Tenosynovitis of right ankle 12/31/2014  . Onychomycosis 12/31/2014  . Pain in lower limb 12/31/2014  . Breast cancer of upper-outer quadrant of right female breast ([place]) 11/20/2011    Past Surgical History:  Procedure Laterality Date  . CARPAL TUNNEL RELEASE    . REPLACEMENT TOTAL KNEE      OB History    No data available       Home Medications    Prior to Admission medications   Medication Sig Start Date End Date Taking? Authorizing Provider  aspirin 81 MG tablet Take 81 mg by mouth daily.    Historical Provider, MD  BENICAR 20 MG tablet Take 10 mg by mouth daily.  08/23/12   Historical Provider, MD  cholecalciferol (VITAMIN D) 1000 UNITS tablet Take 1,000 Units by mouth 2 (two) times daily.    Historical Provider, MD  clobetasol (TEMOVATE) 0.05 % external solution Apply 1 application topically daily.  12/23/12   Historical Provider, MD  clonazepam (KLONOPIN) 0.125 MG disintegrating tablet Take 0.125 mg by mouth 2 (two) times daily as needed for seizure.  03/04/15   Historical Provider, MD  clotrimazole (LOTRIMIN) 1 % cream Apply topically 2 (two) times daily as needed.     Historical Provider, MD  cyclobenzaprine (FLEXERIL) 5 MG tablet Take 1 tablet (5 mg total) by mouth 3 (three) times daily as needed for muscle spasms. 02/01/16    Merryl Hacker, MD  cycloSPORINE (RESTASIS) 0.05 % ophthalmic emulsion Place 1 drop into both eyes 2 (two) times daily.    Historical Provider, MD  escitalopram (LEXAPRO) 10 MG tablet Take 10 mg by mouth daily.  02/22/15   Historical Provider, MD  fentaNYL (DURAGESIC - DOSED MCG/HR) 50 MCG/HR Place 1 patch onto the skin every other day.     Historical Provider, MD  fluticasone (FLONASE) 50 MCG/ACT nasal spray Place 2 sprays into the nose daily.    Historical Provider, MD  hyoscyamine (ANASPAZ) 0.125 MG TBDP Place 0.125 mg under the tongue every 6 (six) hours as needed for bladder spasms.     Historical Provider, MD  lansoprazole (PREVACID) 30 MG capsule Take 30 mg by mouth daily.    Historical Provider, MD  levothyroxine (SYNTHROID, LEVOTHROID) 88 MCG tablet Take 88 mcg by mouth daily before breakfast.  01/14/15   Historical Provider, MD  meclizine (ANTIVERT) 25 MG tablet Take 25 mg by mouth 3 (three) times daily as needed.    Historical Provider, MD  methylPREDNISolone (MEDROL DOSEPAK) 4 MG TBPK tablet Take as directed on packet. 02/01/16   Merryl Hacker, MD  MYRBETRIQ 25 MG TB24 tablet Take 25 mg by mouth daily. 03/13/14   Historical Provider, MD  oxyCODONE (ROXICODONE) 15 MG immediate release tablet Take 10 mg by mouth 2 (two) times daily.    Historical Provider, MD  TACLONEX external suspension Apply 1 application topically daily. 03/25/14   Historical Provider, MD  traZODone (DESYREL) 50 MG tablet Take 50 mg by mouth at bedtime.  02/28/15   Historical Provider, MD    Family History No family history on file.  Social History Social History  Substance Use Topics  . Smoking status: Never Smoker  . Smokeless tobacco: Never Used  . Alcohol use Not on file     Allergies   Diclofenac sodium; Procaine hcl; Sulfa antibiotics; Vicodin [hydrocodone-acetaminophen]; Novocain [procaine]; and Other   Review of Systems Review of Systems  Constitutional: Negative for chills, diaphoresis, fatigue  and fever.  HENT: Negative for congestion and rhinorrhea.   Eyes: Negative for visual disturbance.  Respiratory: Negative for chest tightness, shortness of breath, wheezing and stridor.   Cardiovascular: Positive for chest pain and leg swelling. Negative for palpitations.  Gastrointestinal: Negative for abdominal pain, constipation, diarrhea, nausea and vomiting.  Genitourinary: Negative for dysuria.  Musculoskeletal: Positive for neck pain. Negative for back pain and neck stiffness.  Skin: Negative for rash and wound.  Neurological: Positive for headaches. Negative for speech difficulty, weakness, light-headedness and numbness.  Psychiatric/Behavioral: Negative for agitation and confusion.  All other systems reviewed and are negative.    Physical Exam Updated Vital Signs BP (!) 182/64 (BP Location: Left Arm)   Pulse 70   Temp 97.6 F (36.4 C)   Resp 16   SpO2 93%   Physical Exam  Constitutional: She is oriented to person, place, and time. She appears well-developed and  well-nourished. No distress.  HENT:  Head: Normocephalic.  Right Ear: External ear normal.  Left Ear: External ear normal.  Nose: Nose normal.  Mouth/Throat: Oropharynx is clear and moist. No oropharyngeal exudate.  Eyes: Conjunctivae and EOM are normal. Pupils are equal, round, and reactive to light. No scleral icterus.  Neck: Normal range of motion. Neck supple.  Cardiovascular: Normal rate, regular rhythm, normal heart sounds and intact distal pulses.   No murmur heard. Pulmonary/Chest: Effort normal and breath sounds normal. No stridor. No respiratory distress. She has no wheezes. She exhibits tenderness.    Abdominal: Soft. She exhibits no distension. There is no tenderness. There is no rebound.  Musculoskeletal: She exhibits tenderness.       Thoracic back: She exhibits tenderness and pain.       Back:  Neurological: She is alert and oriented to person, place, and time. She has normal reflexes. She is  not disoriented. No cranial nerve deficit or sensory deficit. She exhibits normal muscle tone. Coordination normal.  Skin: Skin is warm. Capillary refill takes less than 2 seconds. No rash noted. She is not diaphoretic. No erythema.  Psychiatric: She has a normal mood and affect.  Nursing note and vitals reviewed.    ED Treatments / Results  Labs (all labs ordered are listed, but only abnormal results are displayed) Labs Reviewed  CBC WITH DIFFERENTIAL/PLATELET - Abnormal; Notable for the following:       Result Value   RBC 3.60 (*)    Hemoglobin 11.4 (*)    HCT 34.9 (*)    Neutro Abs 7.8 (*)    All other components within normal limits  COMPREHENSIVE METABOLIC PANEL - Abnormal; Notable for the following:    Chloride 99 (*)    Glucose, Bld 103 (*)    ALT 13 (*)    GFR calc non Af Amer 50 (*)    GFR calc Af Amer 58 (*)    All other components within normal limits  BRAIN NATRIURETIC PEPTIDE - Abnormal; Notable for the following:    B Natriuretic Peptide 112.7 (*)    All other components within normal limits  URINALYSIS, ROUTINE W REFLEX MICROSCOPIC - Abnormal; Notable for the following:    Hgb urine dipstick SMALL (*)    All other components within normal limits  COMPREHENSIVE METABOLIC PANEL - Abnormal; Notable for the following:    Chloride 98 (*)    Creatinine, Ser 1.20 (*)    Total Protein 5.9 (*)    Albumin 3.4 (*)    ALT 13 (*)    GFR calc non Af Amer 37 (*)    GFR calc Af Amer 43 (*)    All other components within normal limits  CBC - Abnormal; Notable for the following:    RBC 3.47 (*)    Hemoglobin 10.6 (*)    HCT 34.5 (*)    All other components within normal limits  URINE CULTURE  PROTIME-INR  CK  I-STAT CG4 LACTIC ACID, ED  I-STAT TROPOININ, ED    EKG  EKG Interpretation  Date/Time:  Sunday January 13 2017 09:50:48 EDT Ventricular Rate:  76 PR Interval:    QRS Duration: 87 QT Interval:  417 QTC Calculation: 447 R Axis:   -15 Text Interpretation:   Normal sinus rhythm with 1st degree A-V block ABNORMAL R WAVE PROGRESSION Confirmed by RAY MD, Andee Poles [phone]) on 01/14/2017 8:00:42 AM       Radiology Dg Chest 2 View  Result Date: 01/13/2017 CLINICAL  DATA:  Unwitnessed fall.  Found on floor.  Dementia. EXAM: CHEST  2 VIEW COMPARISON:  Chest radiograph 10/31/2010. FINDINGS: Cardiomediastinal silhouette is increased. No active infiltrates or failure. Prior BILATERAL shoulder surgery. No visible acute rib fracture or pneumothorax. Degenerative change in the thoracic spine without definite acute compression deformity. IMPRESSION: Cardiomegaly. No definite active infiltrates or failure. No visible acute compression fracture. Electronically Signed   By: Staci Righter M.D.   On: 01/13/2017 09:39   Ct Head Wo Contrast  Result Date: 01/13/2017 CLINICAL DATA:  Rib pain after a fall. EXAM: CT HEAD WITHOUT CONTRAST CT CERVICAL SPINE WITHOUT CONTRAST TECHNIQUE: Multidetector CT imaging of the head and cervical spine was performed following the standard protocol without intravenous contrast. Multiplanar CT image reconstructions of the cervical spine were also generated. COMPARISON:  CT neck 02/01/2016. FINDINGS: CT HEAD FINDINGS Brain: No evidence for acute infarction, hemorrhage, mass lesion, hydrocephalus, or extra-axial fluid. Global atrophy. Hypoattenuation of white matter suggesting small vessel disease. Old LEFT occipital infarct. Vascular: No intracranial hyperdense vessel to suggest acute thrombosis. BILATERAL cavernous carotid and distal vertebral calcification, not unexpected for age. Skull: No skull fracture.  No worrisome osseous lesion. Sinuses/Orbits: BILATERAL cataract extraction. No layering sinus fluid. Other: None. CT CERVICAL SPINE FINDINGS Alignment: Degenerative anterolisthesis C3-C4. Chronic anterolisthesis of the odontoid on body of CTA related to nonunion. Previous posterior fusion. Skull base and vertebrae: There is a chronic ununited type 2  odontoid fracture similar to 2017. Chronic C1 arch fractures are also stable. Posterior cerclage wire at C1-C2 appears similar. There has been previous C5-C7 ACDF with anterior plating without adverse features. No acute fracture is seen. Soft tissues and spinal canal: No prevertebral soft tissue swelling. No intraspinal hematoma. Mild central canal stenosis may be present C5-C6. Atherosclerosis. Nuchal ligament calcification. Disc levels:  Multilevel spondylosis. Upper chest: Biapical scarring. Tiny RIGHT pneumothorax. No rib fracture can be seen. Other: None. Compared with 2017, the C1 and odontoid nonunions have a similar appearance. No pneumothorax was present in 2017. IMPRESSION: No skull fracture or intracranial hemorrhage. 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 Posterior cerclage wire fusion appears solid. Tiny RIGHT apical pneumothorax. Biapical scarring appears similar. If further investigation desired, recommend CT chest without contrast. Critical Value/emergent results were called by telephone at the time of interpretation on 01/13/2017 at 9:58 am to Dr. Marda Stalker , who verbally acknowledged these results. Electronically Signed   By: Staci Righter M.D.   On: 01/13/2017 10:05   Ct Chest W Contrast  Result Date: 01/13/2017 CLINICAL DATA:  Unwitnessed fall.  RIGHT-sided chest pain. EXAM: CT CHEST WITH CONTRAST TECHNIQUE: Multidetector CT imaging of the chest was performed during intravenous contrast administration. CONTRAST:  1 ISOVUE-300 IOPAMIDOL (ISOVUE-300) INJECTION 61% COMPARISON:  Chest radiograph earlier today. CT cervical spine earlier today. CT abdomen and pelvis 06/03/2013. FINDINGS: Cardiovascular: No significant vascular findings. Cardiomegaly. Mild coronary artery calcification. Mediastinum/Nodes: No hilar adenopathy. Pretracheal nodal enlargement up to 12 mm, stable from priors. No paratracheal or AP window adenopathy. Lungs/Pleura:  RIGHT pleural effusion/hemothorax. Small RIGHT apical pneumothorax noted on CT cervical spine has resolved. There are tiny bubbles of air in the RIGHT effusion and in the RIGHT posterior soft tissues. Biapical pleural thickening. BILATERAL pleural plaques similar to prior chest CT, likely post XRT findings. Upper Abdomen: Post cholecystectomy appearance with biliary ductal dilatation similar to prior CT. Musculoskeletal: Displaced posterior RIGHT eighth rib fracture, reference image 93 series 5. Nondisplaced posterolateral RIGHT seventh rib fracture.  Tiny nondisplaced RIGHT transverse process fracture at T8. Surgically replaced LEFT humeral head. Severe degenerative change RIGHT shoulder. IMPRESSION: There is a small RIGHT hemothorax related to displaced RIGHT eighth rib fracture. This rib fracture cannot clearly be identified on prior chest radiograph. No significant pneumothorax. A small apical pneumothorax noted on CT cervical spine and resolved. A few bubbles of air can be seen in the RIGHT pleural fluid as well as in the soft tissues of the back. Nondisplaced RIGHT seventh rib fracture and nondisplaced RIGHT T8 transverse process fracture. Electronically Signed   By: Staci Righter M.D.   On: 01/13/2017 11:47   Ct Cervical Spine Wo Contrast  Result Date: 01/13/2017 CLINICAL DATA:  Rib pain after a fall. EXAM: CT HEAD WITHOUT CONTRAST CT CERVICAL SPINE WITHOUT CONTRAST TECHNIQUE: Multidetector CT imaging of the head and cervical spine was performed following the standard protocol without intravenous contrast. Multiplanar CT image reconstructions of the cervical spine were also generated. COMPARISON:  CT neck 02/01/2016. FINDINGS: CT HEAD FINDINGS Brain: No evidence for acute infarction, hemorrhage, mass lesion, hydrocephalus, or extra-axial fluid. Global atrophy. Hypoattenuation of white matter suggesting small vessel disease. Old LEFT occipital infarct. Vascular: No intracranial hyperdense vessel to suggest  acute thrombosis. BILATERAL cavernous carotid and distal vertebral calcification, not unexpected for age. Skull: No skull fracture.  No worrisome osseous lesion. Sinuses/Orbits: BILATERAL cataract extraction. No layering sinus fluid. Other: None. CT CERVICAL SPINE FINDINGS Alignment: Degenerative anterolisthesis C3-C4. Chronic anterolisthesis of the odontoid on body of CTA related to nonunion. Previous posterior fusion. Skull base and vertebrae: There is a chronic ununited type 2 odontoid fracture similar to 2017. Chronic C1 arch fractures are also stable. Posterior cerclage wire at C1-C2 appears similar. There has been previous C5-C7 ACDF with anterior plating without adverse features. No acute fracture is seen. Soft tissues and spinal canal: No prevertebral soft tissue swelling. No intraspinal hematoma. Mild central canal stenosis may be present C5-C6. Atherosclerosis. Nuchal ligament calcification. Disc levels:  Multilevel spondylosis. Upper chest: Biapical scarring. Tiny RIGHT pneumothorax. No rib fracture can be seen. Other: None. Compared with 2017, the C1 and odontoid nonunions have a similar appearance. No pneumothorax was present in 2017. IMPRESSION: No skull fracture or intracranial hemorrhage. 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 Posterior cerclage wire fusion appears solid. Tiny RIGHT apical pneumothorax. Biapical scarring appears similar. If further investigation desired, recommend CT chest without contrast. Critical Value/emergent results were called by telephone at the time of interpretation on 01/13/2017 at 9:58 am to Dr. Marda Stalker , who verbally acknowledged these results. Electronically Signed   By: Staci Righter M.D.   On: 01/13/2017 10:05   Dg Chest Port 1 View  Result Date: 01/14/2017 CLINICAL DATA:  Right rib fractures.  Chest pain. EXAM: PORTABLE CHEST 1 VIEW COMPARISON:  Chest radiograph from one day prior. FINDINGS: Partially  visualized surgical hardware from ACDF overlies the lower cervical spine. Left total shoulder arthroplasty. Stable cardiomediastinal silhouette with top-normal heart size and aortic atherosclerosis. No pneumothorax. No pleural effusion. No pulmonary edema. Stable mild left basilar atelectasis. No acute consolidative airspace disease. IMPRESSION: 1. No pneumothorax. 2. No pleural effusion. 3. Stable mild left basilar atelectasis . 4. Aortic atherosclerosis. Electronically Signed   By: Ilona Sorrel M.D.   On: 01/14/2017 07:26   Dg Hand Complete Right  Result Date: 01/13/2017 CLINICAL DATA:  RIGHT hand pain diffusely.  Unwitnessed fall. EXAM: RIGHT HAND - COMPLETE 3+ VIEW COMPARISON:  None. FINDINGS: Severe degenerative  change at the radiocarpal, intercarpal, carpometacarpal, and metacarpophalangeal joints. Osteopenia. No definite fracture. No dislocation. No significant soft tissue swelling or foreign body. IMPRESSION: No definite acute findings.  Advanced degenerative change. Electronically Signed   By: Staci Righter M.D.   On: 01/13/2017 09:40    Procedures Procedures (including critical care time)  Medications Ordered in ED Medications  iopamidol (ISOVUE-300) 61 % injection 75 mL (not administered)  clonazePAM (KLONOPIN) tablet 0.25 mg (0.25 mg Oral Given 01/13/17 1654)  donepezil (ARICEPT) tablet 5 mg (5 mg Oral Given 01/13/17 1654)  mometasone-formoterol (DULERA) 200-5 MCG/ACT inhaler 2 puff (2 puffs Inhalation Given 01/14/17 0717)  HYDROmorphone (DILAUDID) tablet 2 mg (not administered)  oxyCODONE (Oxy IR/ROXICODONE) immediate release tablet 10 mg (not administered)  escitalopram (LEXAPRO) tablet 10 mg (10 mg Oral Given 01/14/17 0935)  levothyroxine (SYNTHROID, LEVOTHROID) tablet 88 mcg (88 mcg Oral Given 01/14/17 0935)  traZODone (DESYREL) tablet 50 mg (50 mg Oral Given 01/13/17 2109)  cholecalciferol (VITAMIN D) tablet 5,000 Units (5,000 Units Oral Given 01/14/17 0935)  mirabegron ER (MYRBETRIQ)  tablet 25 mg (25 mg Oral Given 01/14/17 0936)  irbesartan (AVAPRO) tablet 150 mg (150 mg Oral Given 01/14/17 0936)  sodium chloride flush (NS) 0.9 % injection 3 mL (3 mLs Intravenous Given 01/14/17 0939)  fentaNYL (DURAGESIC - dosed mcg/hr) 50 mcg (not administered)  methocarbamol (ROBAXIN) tablet 500 mg (not administered)  traMADol (ULTRAM) tablet 50 mg (50 mg Oral Given 01/14/17 0935)  oxyCODONE (Oxy IR/ROXICODONE) immediate release tablet 5 mg (5 mg Oral Given 01/13/17 0954)  iopamidol (ISOVUE-300) 61 % injection (75 mLs Intravenous Contrast Given 01/13/17 1117)     Initial Impression / Assessment and Plan / ED Course  I have reviewed the triage vital signs and the nursing notes.  Pertinent labs & imaging results that were available during my care of the patient were reviewed by me and considered in my medical decision making (see chart for details).     [name] is a 83 y.o. female with a past medical history significant for arthritis and breast cancer who presents with central back pain, right-sided chest pain, neck pain, and mild headache after a fall.  History and exam are seen above.  On exam, patient has tenderness on the right lateral chest. Patient also has tenderness in the central thoracic back. Patient has tenderness in her left paraspinal neck and left occipital area. Patient had no focal neurologic deficits. Patient had some swelling of the right middle finger at the proximal interphalangeal joint distally but denied pain in this location or tenderness. Patient had symmetric grip strength. Patient had bilateral lower extremity edema to the knees. This is reportedly chronic. No hip tenderness or abdominal tenderness. No evidence of head injury with lacerations or contusions.  Given patient's report of headache, neck pain, and chest pain after a fall, initial imaging to look for injuries. With the report of previous rib fractures and the chest tenderness, CT imaging will be  obtained to look for pulmonary contusion, pneumothorax, or multiple rib fractures. Imaging also be obtained to look for pulmonary edema given the shortness of breath the patient has when laying flat and the lower extremity edema.   Patient given oral pain medication which is requesting for 7 out of 10 pain. Patient will have laboratory testing to look for organ abnormality or dysfunction.   At this visit reassessment following workup.  Imaging revealed multiple rib fractures, hemothorax, and pneumothorax. Also a transverse process fracture.  Given patient's injuries,  patient will be admitted for pain management, pulmonary toilet, and trauma evaluation.  Patient admitted to hospitalist service in stable condition.    Final Clinical Impressions(s) / ED Diagnoses   Final diagnoses:  Hemothorax  Traumatic pneumothorax, initial encounter  Closed fracture of multiple ribs of right side, initial encounter    Clinical Impression: 1. Hemothorax   2. Rib fractures   3. Traumatic pneumothorax, initial encounter     Disposition: Admit to Hospitalist service with trauma consulted    Courtney Paris, MD 01/14/17 1055

## 2017-01-13 NOTE — ED Notes (Signed)
Bed: BS96 Expected date:  Expected time:  Means of arrival:  Comments: 83 yo fall, rib pain

## 2017-01-13 NOTE — ED Triage Notes (Addendum)
Per EMS, pt is from home. Daughter found pt on the floor this morning when arriving to check on her. Per EMS pt is complaining of pain to right posterior side of ribs. Pt has a hx of dementia and is alert to person, place, and situation. Per EMS pt daughter stated pt does not take any anticoagulants. EMS reports that pt uses a walker to ambulate.

## 2017-01-13 NOTE — H&P (Addendum)
History and Physical    [name]:[id number] DOB: 1917-12-18 DOA: 01/13/2017  I have briefly reviewed the patient's prior medical records in [place]  PCP: Tivis Ringer, MD  Patient coming from: Home  Chief Complaint: Fall  HPI: [name] is a 83 y.o. female with medical history significant of osteoarthritis, chronic pain, prior history of breast cancer currently in remission, presents to the emergency room with chief complaint of a fall early this morning.  Patient has underlying mild dementia, however she is able to recall that she had a ground-level fall this morning.  She denies any chest pain or palpitations or loss of consciousness prior to the fall.  She complains of recurrent falls, and the daughter who is at bedside concurs patient's story.  She states she does not know why she falls, she is not tripping on anything but her legs give out and she loses her balance and finds her cell phone to the floor.  She has had no fever or chills, she denies any cough chest congestion or dysuria.  Other than the recurrent falls, she is at baseline.  ED Course: In the emergency room, her vital signs are stable, she is afebrile, blood work is stable without significant abnormalities.  She underwent a CT scan of the chest given significant chest pain at the site of her fall, and that showed a small right hemothorax related to a displaced right eighth rib fracture.  Trauma surgery was consulted by EDP, and recommended admission for observation, patient to be admitted from [facility] emergency department to [facility] where she will be seen in consultation by the trauma surgeon.  Review of Systems: As per HPI otherwise 10 point review of systems negative.   Past Medical History:  Diagnosis Date  . Arthritis     Past Surgical History:  Procedure Laterality Date  . CARPAL TUNNEL RELEASE    . REPLACEMENT TOTAL KNEE       reports that she has never smoked. She has never  used smokeless tobacco. Her alcohol and drug histories are not on file.  Allergies  Allergen Reactions  . Diclofenac Sodium   . Procaine Hcl   . Sulfa Antibiotics   . Vicodin [Hydrocodone-Acetaminophen]   . Novocain [Procaine]     Per patient   . Other     Percandan, coraseden and voltairn (per patient), patient not sure what reaction was.    Family history reviewed and noncontributory  Prior to Admission medications   Medication Sig Start Date End Date Taking? Authorizing Provider  aspirin 81 MG tablet Take 81 mg by mouth daily.   Yes Historical Provider, MD  BENICAR 20 MG tablet Take 20 mg by mouth daily.  08/23/12  Yes Historical Provider, MD  cholecalciferol (VITAMIN D) 1000 UNITS tablet Take 5,000 Units by mouth daily.    Yes Historical Provider, MD  clonazePAM (KLONOPIN) 0.25 MG disintegrating tablet Take 0.25 mg by mouth 2 (two) times daily. 12/13/16  Yes Historical Provider, MD  donepezil (ARICEPT) 5 MG tablet Take 5 mg by mouth every evening. With food 12/07/16  Yes Historical Provider, MD  escitalopram (LEXAPRO) 10 MG tablet Take 10 mg by mouth daily.  02/22/15  Yes Historical Provider, MD  fentaNYL (DURAGESIC - DOSED MCG/HR) 50 MCG/HR Place 1 patch onto the skin every other day.    Yes Historical Provider, MD  fluocinonide cream (LIDEX) 8.54 % Apply 1 application topically 2 (two) times daily.   Yes Historical Provider,  MD  Fluticasone-Salmeterol (ADVAIR DISKUS) 250-50 MCG/DOSE AEPB Inhale 2 puffs into the lungs daily as needed.   Yes Historical Provider, MD  folic acid (FOLVITE) 1 MG tablet Take 1 mg by mouth daily. 01/08/17  Yes Historical Provider, MD  HYDROmorphone (DILAUDID) 2 MG tablet Take 2 mg by mouth 2 (two) times daily as needed for severe pain.   Yes Historical Provider, MD  levothyroxine (SYNTHROID, LEVOTHROID) 88 MCG tablet Take 88 mcg by mouth daily before breakfast.  01/14/15  Yes Historical Provider, MD  methotrexate (RHEUMATREX) 2.5 MG tablet Take 12.5 mg by mouth  every 7 (seven) days.  12/12/16  Yes Historical Provider, MD  MYRBETRIQ 25 MG TB24 tablet Take 25 mg by mouth every other day.  03/13/14  Yes Historical Provider, MD  Oxycodone HCl 10 MG TABS Take 10 mg by mouth 3 (three) times daily.   Yes Historical Provider, MD  traZODone (DESYREL) 50 MG tablet Take 50 mg by mouth at bedtime.  02/28/15  Yes Historical Provider, MD  cyclobenzaprine (FLEXERIL) 5 MG tablet Take 1 tablet (5 mg total) by mouth 3 (three) times daily as needed for muscle spasms. Patient not taking: Reported on 01/13/2017 02/01/16   Merryl Hacker, MD  methylPREDNISolone (MEDROL DOSEPAK) 4 MG TBPK tablet Take as directed on packet. Patient not taking: Reported on 01/13/2017 02/01/16   Merryl Hacker, MD    Physical Exam: Vitals:   01/13/17 0802 01/13/17 1100  BP: (!) 182/64 (!) 152/92  Pulse: 70   Resp: 16 (!) 23  Temp: 97.6 F (36.4 C)   SpO2: 93%       Constitutional: NAD, calm, comfortable Vitals:   01/13/17 0802 01/13/17 1100  BP: (!) 182/64 (!) 152/92  Pulse: 70   Resp: 16 (!) 23  Temp: 97.6 F (36.4 C)   SpO2: 93%    Eyes: PERRL, lids and conjunctivae normal ENMT: Mucous membranes are moist. Posterior pharynx clear of any exudate or lesions.Normal dentition.  Neck: normal, supple, no masses, no thyromegaly Respiratory: clear to auscultation bilaterally, no wheezing, no crackles. Normal respiratory effort. No accessory muscle use.  Cardiovascular: Regular rate and rhythm, no murmurs / rubs / gallops. No extremity edema. 2+ pedal pulses.  Abdomen: no tenderness, no masses palpated. Bowel sounds positive.  Musculoskeletal: no clubbing / cyanosis. Normal muscle tone.  Skin: no rashes, lesions, ulcers. No induration Neurologic: CN 2-12 grossly intact. Strength 5/5 in all 4.  Psychiatric: Normal judgment and insight. Alert and oriented x 3. Normal mood.   Labs on Admission: I have personally reviewed following labs and imaging studies  CBC:  Recent Labs Lab  01/13/17 1000  WBC 9.9  NEUTROABS 7.8*  HGB 11.4*  HCT 34.9*  MCV 96.9  PLT 413   Basic Metabolic Panel:  Recent Labs Lab 01/13/17 1000  NA 137  K 4.2  CL 99*  CO2 30  GLUCOSE 103*  BUN 13  CREATININE 0.93  CALCIUM 9.4   GFR: CrCl cannot be calculated (Unknown ideal weight.). Liver Function Tests:  Recent Labs Lab 01/13/17 1000  AST 31  ALT 13*  ALKPHOS 86  BILITOT 0.8  PROT 7.3  ALBUMIN 4.1   No results for input(s): LIPASE, AMYLASE in the last 168 hours. No results for input(s): AMMONIA in the last 168 hours. Coagulation Profile:  Recent Labs Lab 01/13/17 1000  INR 0.98   Cardiac Enzymes:  Recent Labs Lab 01/13/17 1000  CKTOTAL 225   BNP (last 3 results) No results for  input(s): PROBNP in the last 8760 hours. HbA1C: No results for input(s): HGBA1C in the last 72 hours. CBG: No results for input(s): GLUCAP in the last 168 hours. Lipid Profile: No results for input(s): CHOL, HDL, LDLCALC, TRIG, CHOLHDL, LDLDIRECT in the last 72 hours. Thyroid Function Tests: No results for input(s): TSH, T4TOTAL, FREET4, T3FREE, THYROIDAB in the last 72 hours. Anemia Panel: No results for input(s): VITAMINB12, FOLATE, FERRITIN, TIBC, IRON, RETICCTPCT in the last 72 hours. Urine analysis:    Component Value Date/Time   COLORURINE YELLOW 01/13/2017 1029   APPEARANCEUR CLEAR 01/13/2017 1029   LABSPEC 1.009 01/13/2017 1029   PHURINE 7.0 01/13/2017 1029   GLUCOSEU NEGATIVE 01/13/2017 1029   HGBUR SMALL (A) 01/13/2017 1029   BILIRUBINUR NEGATIVE 01/13/2017 1029   KETONESUR NEGATIVE 01/13/2017 1029   PROTEINUR NEGATIVE 01/13/2017 1029   NITRITE NEGATIVE 01/13/2017 1029   LEUKOCYTESUR NEGATIVE 01/13/2017 1029     Radiological Exams on Admission: Dg Chest 2 View  Result Date: 01/13/2017 CLINICAL DATA:  Unwitnessed fall.  Found on floor.  Dementia. EXAM: CHEST  2 VIEW COMPARISON:  Chest radiograph 10/31/2010. FINDINGS: Cardiomediastinal silhouette is  increased. No active infiltrates or failure. Prior BILATERAL shoulder surgery. No visible acute rib fracture or pneumothorax. Degenerative change in the thoracic spine without definite acute compression deformity. IMPRESSION: Cardiomegaly. No definite active infiltrates or failure. No visible acute compression fracture. Electronically Signed   By: Staci Righter M.D.   On: 01/13/2017 09:39   Ct Head Wo Contrast  Result Date: 01/13/2017 CLINICAL DATA:  Rib pain after a fall. EXAM: CT HEAD WITHOUT CONTRAST CT CERVICAL SPINE WITHOUT CONTRAST TECHNIQUE: Multidetector CT imaging of the head and cervical spine was performed following the standard protocol without intravenous contrast. Multiplanar CT image reconstructions of the cervical spine were also generated. COMPARISON:  CT neck 02/01/2016. FINDINGS: CT HEAD FINDINGS Brain: No evidence for acute infarction, hemorrhage, mass lesion, hydrocephalus, or extra-axial fluid. Global atrophy. Hypoattenuation of white matter suggesting small vessel disease. Old LEFT occipital infarct. Vascular: No intracranial hyperdense vessel to suggest acute thrombosis. BILATERAL cavernous carotid and distal vertebral calcification, not unexpected for age. Skull: No skull fracture.  No worrisome osseous lesion. Sinuses/Orbits: BILATERAL cataract extraction. No layering sinus fluid. Other: None. CT CERVICAL SPINE FINDINGS Alignment: Degenerative anterolisthesis C3-C4. Chronic anterolisthesis of the odontoid on body of CTA related to nonunion. Previous posterior fusion. Skull base and vertebrae: There is a chronic ununited type 2 odontoid fracture similar to 2017. Chronic C1 arch fractures are also stable. Posterior cerclage wire at C1-C2 appears similar. There has been previous C5-C7 ACDF with anterior plating without adverse features. No acute fracture is seen. Soft tissues and spinal canal: No prevertebral soft tissue swelling. No intraspinal hematoma. Mild central canal stenosis may be  present C5-C6. Atherosclerosis. Nuchal ligament calcification. Disc levels:  Multilevel spondylosis. Upper chest: Biapical scarring. Tiny RIGHT pneumothorax. No rib fracture can be seen. Other: None. Compared with 2017, the C1 and odontoid nonunions have a similar appearance. No pneumothorax was present in 2017. IMPRESSION: No skull fracture or intracranial hemorrhage. 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 Posterior cerclage wire fusion appears solid. Tiny RIGHT apical pneumothorax. Biapical scarring appears similar. If further investigation desired, recommend CT chest without contrast. Critical Value/emergent results were called by telephone at the time of interpretation on 01/13/2017 at 9:58 am to Dr. Marda Stalker , who verbally acknowledged these results. Electronically Signed   By: Staci Righter M.D.   On:  01/13/2017 10:05   Ct Chest W Contrast  Result Date: 01/13/2017 CLINICAL DATA:  Unwitnessed fall.  RIGHT-sided chest pain. EXAM: CT CHEST WITH CONTRAST TECHNIQUE: Multidetector CT imaging of the chest was performed during intravenous contrast administration. CONTRAST:  1 ISOVUE-300 IOPAMIDOL (ISOVUE-300) INJECTION 61% COMPARISON:  Chest radiograph earlier today. CT cervical spine earlier today. CT abdomen and pelvis 06/03/2013. FINDINGS: Cardiovascular: No significant vascular findings. Cardiomegaly. Mild coronary artery calcification. Mediastinum/Nodes: No hilar adenopathy. Pretracheal nodal enlargement up to 12 mm, stable from priors. No paratracheal or AP window adenopathy. Lungs/Pleura: RIGHT pleural effusion/hemothorax. Small RIGHT apical pneumothorax noted on CT cervical spine has resolved. There are tiny bubbles of air in the RIGHT effusion and in the RIGHT posterior soft tissues. Biapical pleural thickening. BILATERAL pleural plaques similar to prior chest CT, likely post XRT findings. Upper Abdomen: Post cholecystectomy appearance with biliary  ductal dilatation similar to prior CT. Musculoskeletal: Displaced posterior RIGHT eighth rib fracture, reference image 93 series 5. Nondisplaced posterolateral RIGHT seventh rib fracture. Tiny nondisplaced RIGHT transverse process fracture at T8. Surgically replaced LEFT humeral head. Severe degenerative change RIGHT shoulder. IMPRESSION: There is a small RIGHT hemothorax related to displaced RIGHT eighth rib fracture. This rib fracture cannot clearly be identified on prior chest radiograph. No significant pneumothorax. A small apical pneumothorax noted on CT cervical spine and resolved. A few bubbles of air can be seen in the RIGHT pleural fluid as well as in the soft tissues of the back. Nondisplaced RIGHT seventh rib fracture and nondisplaced RIGHT T8 transverse process fracture. Electronically Signed   By: Staci Righter M.D.   On: 01/13/2017 11:47   Ct Cervical Spine Wo Contrast  Result Date: 01/13/2017 CLINICAL DATA:  Rib pain after a fall. EXAM: CT HEAD WITHOUT CONTRAST CT CERVICAL SPINE WITHOUT CONTRAST TECHNIQUE: Multidetector CT imaging of the head and cervical spine was performed following the standard protocol without intravenous contrast. Multiplanar CT image reconstructions of the cervical spine were also generated. COMPARISON:  CT neck 02/01/2016. FINDINGS: CT HEAD FINDINGS Brain: No evidence for acute infarction, hemorrhage, mass lesion, hydrocephalus, or extra-axial fluid. Global atrophy. Hypoattenuation of white matter suggesting small vessel disease. Old LEFT occipital infarct. Vascular: No intracranial hyperdense vessel to suggest acute thrombosis. BILATERAL cavernous carotid and distal vertebral calcification, not unexpected for age. Skull: No skull fracture.  No worrisome osseous lesion. Sinuses/Orbits: BILATERAL cataract extraction. No layering sinus fluid. Other: None. CT CERVICAL SPINE FINDINGS Alignment: Degenerative anterolisthesis C3-C4. Chronic anterolisthesis of the odontoid on body  of CTA related to nonunion. Previous posterior fusion. Skull base and vertebrae: There is a chronic ununited type 2 odontoid fracture similar to 2017. Chronic C1 arch fractures are also stable. Posterior cerclage wire at C1-C2 appears similar. There has been previous C5-C7 ACDF with anterior plating without adverse features. No acute fracture is seen. Soft tissues and spinal canal: No prevertebral soft tissue swelling. No intraspinal hematoma. Mild central canal stenosis may be present C5-C6. Atherosclerosis. Nuchal ligament calcification. Disc levels:  Multilevel spondylosis. Upper chest: Biapical scarring. Tiny RIGHT pneumothorax. No rib fracture can be seen. Other: None. Compared with 2017, the C1 and odontoid nonunions have a similar appearance. No pneumothorax was present in 2017. IMPRESSION: No skull fracture or intracranial hemorrhage. 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 Posterior cerclage wire fusion appears solid. Tiny RIGHT apical pneumothorax. Biapical scarring appears similar. If further investigation desired, recommend CT chest without contrast. Critical Value/emergent results were called by telephone at the time of  interpretation on 01/13/2017 at 9:58 am to Dr. Marda Stalker , who verbally acknowledged these results. Electronically Signed   By: Staci Righter M.D.   On: 01/13/2017 10:05   Dg Hand Complete Right  Result Date: 01/13/2017 CLINICAL DATA:  RIGHT hand pain diffusely.  Unwitnessed fall. EXAM: RIGHT HAND - COMPLETE 3+ VIEW COMPARISON:  None. FINDINGS: Severe degenerative change at the radiocarpal, intercarpal, carpometacarpal, and metacarpophalangeal joints. Osteopenia. No definite fracture. No dislocation. No significant soft tissue swelling or foreign body. IMPRESSION: No definite acute findings.  Advanced degenerative change. Electronically Signed   By: Staci Righter M.D.   On: 01/13/2017 09:40    EKG: Independently reviewed.  Sinus  rhythm  Assessment/Plan Active Problems:   Breast cancer of upper-outer quadrant of right female breast (HCC)   Pain in lower limb   Hemothorax   Recurrent falls/trauma with hemothorax -Trauma surgery consulted, will admit patient for observation, they will see in consultation -Pain control and symptom management for now  Mild dementia -Continue Aricept  Anxiety/depression -Continue Klonopin, Lexapro  Chronic pain -Continue home medications, concern whether her home pain regimen is causing her falls  Mild anemia -Stable, no evidence of bleeding  Prior breast cancer -Managed as an outpatient   DVT prophylaxis: SCDs Code Status: Full code Family Communication: Discussed with daughter at bedside Disposition Plan: Admit to telemetry Consults called: Trauma surgery    Admission status: Observation  At the point of initial evaluation, it is my clinical opinion that admission for OBSERVATION is reasonable and necessary because the patient's presenting complaints in the context of their chronic conditions represent sufficient risk of deterioration or significant morbidity to constitute reasonable grounds for close observation in the hospital setting, but that the patient may be medically stable for discharge from the hospital within 24 to 48 hours.    Marzetta Board, MD [facility] Pager [phone]  If 7PM-7AM, please contact night-coverage [email or web address] Password [email or web address]  01/13/2017, 1:08 PM

## 2017-01-14 ENCOUNTER — Observation Stay (HOSPITAL_COMMUNITY): Payer: Medicare Other

## 2017-01-14 DIAGNOSIS — S2231XS Fracture of one rib, right side, sequela: Secondary | ICD-10-CM

## 2017-01-14 DIAGNOSIS — S2241XA Multiple fractures of ribs, right side, initial encounter for closed fracture: Secondary | ICD-10-CM | POA: Diagnosis not present

## 2017-01-14 DIAGNOSIS — J942 Hemothorax: Secondary | ICD-10-CM | POA: Diagnosis not present

## 2017-01-14 LAB — COMPREHENSIVE METABOLIC PANEL
ALT: 13 U/L — ABNORMAL LOW (ref 14–54)
AST: 25 U/L (ref 15–41)
Albumin: 3.4 g/dL — ABNORMAL LOW (ref 3.5–5.0)
Alkaline Phosphatase: 76 U/L (ref 38–126)
Anion gap: 8 (ref 5–15)
BUN: 12 mg/dL (ref 6–20)
CHLORIDE: 98 mmol/L — AB (ref 101–111)
CO2: 31 mmol/L (ref 22–32)
CREATININE: 1.2 mg/dL — AB (ref 0.44–1.00)
Calcium: 8.9 mg/dL (ref 8.9–10.3)
GFR calc Af Amer: 43 mL/min — ABNORMAL LOW (ref >60)
GFR, EST NON AFRICAN AMERICAN: 37 mL/min — AB (ref >60)
Glucose, Bld: 92 mg/dL (ref 65–99)
POTASSIUM: 3.9 mmol/L (ref 3.5–5.1)
SODIUM: 137 mmol/L (ref 135–145)
Total Bilirubin: 1 mg/dL (ref 0.3–1.2)
Total Protein: 5.9 g/dL — ABNORMAL LOW (ref 6.5–8.1)

## 2017-01-14 LAB — URINE CULTURE

## 2017-01-14 LAB — CBC
HCT: 34.5 % — ABNORMAL LOW (ref 36.0–46.0)
HEMOGLOBIN: 10.6 g/dL — AB (ref 12.0–15.0)
MCH: 30.5 pg (ref 26.0–34.0)
MCHC: 30.7 g/dL (ref 30.0–36.0)
MCV: 99.4 fL (ref 78.0–100.0)
PLATELETS: 206 10*3/uL (ref 150–400)
RBC: 3.47 MIL/uL — AB (ref 3.87–5.11)
RDW: 15.5 % (ref 11.5–15.5)
WBC: 5.8 10*3/uL (ref 4.0–10.5)

## 2017-01-14 MED ORDER — METHOCARBAMOL 500 MG PO TABS: 500.0000 mg | ORAL_TABLET | Freq: Three times a day (TID) | ORAL | 0 refills | Status: DC | PRN

## 2017-01-14 MED ORDER — HYDROMORPHONE HCL 2 MG PO TABS: 2.0000 mg | ORAL_TABLET | Freq: Two times a day (BID) | ORAL | Status: DC | PRN

## 2017-01-14 MED ORDER — OXYCODONE HCL 10 MG PO TABS: 10.0000 mg | ORAL_TABLET | Freq: Three times a day (TID) | ORAL | 0 refills | Status: DC | PRN

## 2017-01-14 MED ORDER — TRAMADOL HCL 50 MG PO TABS
50.0000 mg | ORAL_TABLET | Freq: Two times a day (BID) | ORAL | Status: DC
  Administered 2017-01-14: 50 mg via ORAL
  Filled 2017-01-14: qty 1

## 2017-01-14 NOTE — Progress Notes (Signed)
Salah J Perdomo to be D/C'd Home per MD order.  Discussed with the patient and all questions fully answered.  VSS, Skin clean, dry and intact without evidence of skin break down, no evidence of skin tears noted. IV catheter discontinued intact. Site without signs and symptoms of complications. Dressing and pressure applied.  An After Visit Summary was printed and given to the patient. Patient received prescription.  D/c education completed with patient/family including follow up instructions, medication list, d/c activities limitations if indicated, with other d/c instructions as indicated by MD - patient able to verbalize understanding, all questions fully answered.   Patient instructed to return to ED, call 911, or call MD for any changes in condition.   Patient escorted via [place], and D/C home via private auto.  Luci Bank 01/14/2017 7:04 PM

## 2017-01-14 NOTE — Discharge Summary (Signed)
Physician Discharge Summary  [name] SAY:[id number] DOB: 1918-01-03 DOA: 01/13/2017  PCP: Tivis Ringer, MD  Admit date: 01/13/2017 Discharge date: 01/14/2017   Recommendations for Outpatient Follow-Up:    1. Incentive spirometry 2. Home health   Discharge Diagnosis:   Active Problems:   Breast cancer of upper-outer quadrant of right female breast ([place])   Pain in lower limb   Hemothorax   Discharge disposition:  Home.   Discharge Condition: Improved.  Diet recommendation: Regular.  Wound care: None.   History of Present Illness:    [name] is a 83 y.o. female with medical history significant of osteoarthritis, chronic pain, prior history of breast cancer currently in remission, presents to the emergency room with chief complaint of a fall early this morning.  Patient has underlying mild dementia, however she is able to recall that she had a ground-level fall this morning.  She denies any chest pain or palpitations or loss of consciousness prior to the fall.  She complains of recurrent falls, and the daughter who is at bedside concurs patient's story.  She states she does not know why she falls, she is not tripping on anything but her legs give out and she loses her balance and finds her cell phone to the floor.  She has had no fever or chills, she denies any cough chest congestion or dysuria.  Other than the recurrent falls, she is at baseline.   Hospital Course by Problem:   Fall with rib fracture on right 7th causing hemothorax -x ray stable -incentive spirometry -pain control -seen by trauma md -seen by PT-- home health  h/o breast cancer  Hypothyroid -continue synthroid  Dementia -continue aricept  Polypharmacy consider reduction of sedating meds as an outpateint   Medical Consultants:    trauma   Discharge Exam:   Vitals:   01/14/17 0521 01/14/17 1513  BP: (!) 119/52 (!) 107/37  Pulse: 65 89  Resp: 16 16  Temp: 97.7 F  (36.5 C) 98.1 F (36.7 C)   Vitals:   01/14/17 0718 01/14/17 1513 01/14/17 1545 01/14/17 1620  BP:  (!) 107/37    Pulse:  89    Resp:  16    Temp:  98.1 F (36.7 C)    TempSrc:  Oral    SpO2: 90% (!) 80% 93% 97%  Weight:      Height:        Gen:  NAD-- improved and wants to go home to her cats   The results of significant diagnostics from this hospitalization (including imaging, microbiology, ancillary and laboratory) are listed below for reference.     Procedures and Diagnostic Studies:   Dg Chest 2 View  Result Date: 01/13/2017 CLINICAL DATA:  Unwitnessed fall.  Found on floor.  Dementia. EXAM: CHEST  2 VIEW COMPARISON:  Chest radiograph 10/31/2010. FINDINGS: Cardiomediastinal silhouette is increased. No active infiltrates or failure. Prior BILATERAL shoulder surgery. No visible acute rib fracture or pneumothorax. Degenerative change in the thoracic spine without definite acute compression deformity. IMPRESSION: Cardiomegaly. No definite active infiltrates or failure. No visible acute compression fracture. Electronically Signed   By: Staci Righter M.D.   On: 01/13/2017 09:39   Ct Head Wo Contrast  Result Date: 01/13/2017 CLINICAL DATA:  Rib pain after a fall. EXAM: CT HEAD WITHOUT CONTRAST CT CERVICAL SPINE WITHOUT CONTRAST TECHNIQUE: Multidetector CT imaging of the head and cervical spine was performed following the standard protocol without intravenous contrast. Multiplanar CT image reconstructions of  the cervical spine were also generated. COMPARISON:  CT neck 02/01/2016. FINDINGS: CT HEAD FINDINGS Brain: No evidence for acute infarction, hemorrhage, mass lesion, hydrocephalus, or extra-axial fluid. Global atrophy. Hypoattenuation of white matter suggesting small vessel disease. Old LEFT occipital infarct. Vascular: No intracranial hyperdense vessel to suggest acute thrombosis. BILATERAL cavernous carotid and distal vertebral calcification, not unexpected for age. Skull: No skull  fracture.  No worrisome osseous lesion. Sinuses/Orbits: BILATERAL cataract extraction. No layering sinus fluid. Other: None. CT CERVICAL SPINE FINDINGS Alignment: Degenerative anterolisthesis C3-C4. Chronic anterolisthesis of the odontoid on body of CTA related to nonunion. Previous posterior fusion. Skull base and vertebrae: There is a chronic ununited type 2 odontoid fracture similar to 2017. Chronic C1 arch fractures are also stable. Posterior cerclage wire at C1-C2 appears similar. There has been previous C5-C7 ACDF with anterior plating without adverse features. No acute fracture is seen. Soft tissues and spinal canal: No prevertebral soft tissue swelling. No intraspinal hematoma. Mild central canal stenosis may be present C5-C6. Atherosclerosis. Nuchal ligament calcification. Disc levels:  Multilevel spondylosis. Upper chest: Biapical scarring. Tiny RIGHT pneumothorax. No rib fracture can be seen. Other: None. Compared with 2017, the C1 and odontoid nonunions have a similar appearance. No pneumothorax was present in 2017. IMPRESSION: No skull fracture or intracranial hemorrhage. 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 Posterior cerclage wire fusion appears solid. Tiny RIGHT apical pneumothorax. Biapical scarring appears similar. If further investigation desired, recommend CT chest without contrast. Critical Value/emergent results were called by telephone at the time of interpretation on 01/13/2017 at 9:58 am to Dr. Marda Stalker , who verbally acknowledged these results. Electronically Signed   By: Staci Righter M.D.   On: 01/13/2017 10:05   Ct Chest W Contrast  Result Date: 01/13/2017 CLINICAL DATA:  Unwitnessed fall.  RIGHT-sided chest pain. EXAM: CT CHEST WITH CONTRAST TECHNIQUE: Multidetector CT imaging of the chest was performed during intravenous contrast administration. CONTRAST:  1 ISOVUE-300 IOPAMIDOL (ISOVUE-300) INJECTION 61% COMPARISON:  Chest  radiograph earlier today. CT cervical spine earlier today. CT abdomen and pelvis 06/03/2013. FINDINGS: Cardiovascular: No significant vascular findings. Cardiomegaly. Mild coronary artery calcification. Mediastinum/Nodes: No hilar adenopathy. Pretracheal nodal enlargement up to 12 mm, stable from priors. No paratracheal or AP window adenopathy. Lungs/Pleura: RIGHT pleural effusion/hemothorax. Small RIGHT apical pneumothorax noted on CT cervical spine has resolved. There are tiny bubbles of air in the RIGHT effusion and in the RIGHT posterior soft tissues. Biapical pleural thickening. BILATERAL pleural plaques similar to prior chest CT, likely post XRT findings. Upper Abdomen: Post cholecystectomy appearance with biliary ductal dilatation similar to prior CT. Musculoskeletal: Displaced posterior RIGHT eighth rib fracture, reference image 93 series 5. Nondisplaced posterolateral RIGHT seventh rib fracture. Tiny nondisplaced RIGHT transverse process fracture at T8. Surgically replaced LEFT humeral head. Severe degenerative change RIGHT shoulder. IMPRESSION: There is a small RIGHT hemothorax related to displaced RIGHT eighth rib fracture. This rib fracture cannot clearly be identified on prior chest radiograph. No significant pneumothorax. A small apical pneumothorax noted on CT cervical spine and resolved. A few bubbles of air can be seen in the RIGHT pleural fluid as well as in the soft tissues of the back. Nondisplaced RIGHT seventh rib fracture and nondisplaced RIGHT T8 transverse process fracture. Electronically Signed   By: Staci Righter M.D.   On: 01/13/2017 11:47   Ct Cervical Spine Wo Contrast  Result Date: 01/13/2017 CLINICAL DATA:  Rib pain after a fall. EXAM: CT HEAD WITHOUT CONTRAST CT  CERVICAL SPINE WITHOUT CONTRAST TECHNIQUE: Multidetector CT imaging of the head and cervical spine was performed following the standard protocol without intravenous contrast. Multiplanar CT image reconstructions of the  cervical spine were also generated. COMPARISON:  CT neck 02/01/2016. FINDINGS: CT HEAD FINDINGS Brain: No evidence for acute infarction, hemorrhage, mass lesion, hydrocephalus, or extra-axial fluid. Global atrophy. Hypoattenuation of white matter suggesting small vessel disease. Old LEFT occipital infarct. Vascular: No intracranial hyperdense vessel to suggest acute thrombosis. BILATERAL cavernous carotid and distal vertebral calcification, not unexpected for age. Skull: No skull fracture.  No worrisome osseous lesion. Sinuses/Orbits: BILATERAL cataract extraction. No layering sinus fluid. Other: None. CT CERVICAL SPINE FINDINGS Alignment: Degenerative anterolisthesis C3-C4. Chronic anterolisthesis of the odontoid on body of CTA related to nonunion. Previous posterior fusion. Skull base and vertebrae: There is a chronic ununited type 2 odontoid fracture similar to 2017. Chronic C1 arch fractures are also stable. Posterior cerclage wire at C1-C2 appears similar. There has been previous C5-C7 ACDF with anterior plating without adverse features. No acute fracture is seen. Soft tissues and spinal canal: No prevertebral soft tissue swelling. No intraspinal hematoma. Mild central canal stenosis may be present C5-C6. Atherosclerosis. Nuchal ligament calcification. Disc levels:  Multilevel spondylosis. Upper chest: Biapical scarring. Tiny RIGHT pneumothorax. No rib fracture can be seen. Other: None. Compared with 2017, the C1 and odontoid nonunions have a similar appearance. No pneumothorax was present in 2017. IMPRESSION: No skull fracture or intracranial hemorrhage. 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 Posterior cerclage wire fusion appears solid. Tiny RIGHT apical pneumothorax. Biapical scarring appears similar. If further investigation desired, recommend CT chest without contrast. Critical Value/emergent results were called by telephone at the time of interpretation on  01/13/2017 at 9:58 am to Dr. Marda Stalker , who verbally acknowledged these results. Electronically Signed   By: Staci Righter M.D.   On: 01/13/2017 10:05   Dg Chest Port 1 View  Result Date: 01/14/2017 CLINICAL DATA:  Right rib fractures.  Chest pain. EXAM: PORTABLE CHEST 1 VIEW COMPARISON:  Chest radiograph from one day prior. FINDINGS: Partially visualized surgical hardware from ACDF overlies the lower cervical spine. Left total shoulder arthroplasty. Stable cardiomediastinal silhouette with top-normal heart size and aortic atherosclerosis. No pneumothorax. No pleural effusion. No pulmonary edema. Stable mild left basilar atelectasis. No acute consolidative airspace disease. IMPRESSION: 1. No pneumothorax. 2. No pleural effusion. 3. Stable mild left basilar atelectasis . 4. Aortic atherosclerosis. Electronically Signed   By: Ilona Sorrel M.D.   On: 01/14/2017 07:26   Dg Hand Complete Right  Result Date: 01/13/2017 CLINICAL DATA:  RIGHT hand pain diffusely.  Unwitnessed fall. EXAM: RIGHT HAND - COMPLETE 3+ VIEW COMPARISON:  None. FINDINGS: Severe degenerative change at the radiocarpal, intercarpal, carpometacarpal, and metacarpophalangeal joints. Osteopenia. No definite fracture. No dislocation. No significant soft tissue swelling or foreign body. IMPRESSION: No definite acute findings.  Advanced degenerative change. Electronically Signed   By: Staci Righter M.D.   On: 01/13/2017 09:40     Labs:   Basic Metabolic Panel:  Recent Labs Lab 01/13/17 1000 01/14/17 0641  NA 137 137  K 4.2 3.9  CL 99* 98*  CO2 30 31  GLUCOSE 103* 92  BUN 13 12  CREATININE 0.93 1.20*  CALCIUM 9.4 8.9   GFR Estimated Creatinine Clearance: 24.4 mL/min (A) (by C-G formula based on SCr of 1.2 mg/dL (H)). Liver Function Tests:  Recent Labs Lab 01/13/17 1000 01/14/17 0641  AST 31 25  ALT 13* 13*  ALKPHOS 86 76  BILITOT 0.8 1.0  PROT 7.3 5.9*  ALBUMIN 4.1 3.4*   No results for input(s): LIPASE,  AMYLASE in the last 168 hours. No results for input(s): AMMONIA in the last 168 hours. Coagulation profile  Recent Labs Lab 01/13/17 1000  INR 0.98    CBC:  Recent Labs Lab 01/13/17 1000 01/14/17 0641  WBC 9.9 5.8  NEUTROABS 7.8*  --   HGB 11.4* 10.6*  HCT 34.9* 34.5*  MCV 96.9 99.4  PLT 223 206   Cardiac Enzymes:  Recent Labs Lab 01/13/17 1000  CKTOTAL 225   BNP: Invalid input(s): POCBNP CBG: No results for input(s): GLUCAP in the last 168 hours. D-Dimer No results for input(s): DDIMER in the last 72 hours. Hgb A1c No results for input(s): HGBA1C in the last 72 hours. Lipid Profile No results for input(s): CHOL, HDL, LDLCALC, TRIG, CHOLHDL, LDLDIRECT in the last 72 hours. Thyroid function studies No results for input(s): TSH, T4TOTAL, T3FREE, THYROIDAB in the last 72 hours.  Invalid input(s): FREET3 Anemia work up No results for input(s): VITAMINB12, FOLATE, FERRITIN, TIBC, IRON, RETICCTPCT in the last 72 hours. Microbiology Recent Results (from the past 240 hour(s))  Urine culture     Status: Abnormal   Collection Time: 01/13/17 10:29 AM  Result Value Ref Range Status   Specimen Description URINE, RANDOM  Final   Special Requests NONE  Final   Culture (A)  Final    10,000 COLONIES/mL LACTOBACILLUS SPECIES Standardized susceptibility testing for this organism is not available. Performed at [facility], [place] [address] [place], [place] [address]    Report Status 01/14/2017 FINAL  Final     Discharge Instructions:   Discharge Instructions    Diet general    Complete by:  As directed    Discharge instructions    Complete by:  As directed    Can resume ASA in 7 days Home health - PT/RN Incentive spirometry PRN heat for pain as well Cbc, BMP 1 week with PCP   Increase activity slowly    Complete by:  As directed      Allergies as of 01/14/2017      Reactions   Diclofenac Sodium    Procaine Hcl    Sulfa Antibiotics    Vicodin  [hydrocodone-acetaminophen]    Novocain [procaine]    Per patient    Other    Percandan, coraseden and voltairn (per patient), patient not sure what reaction was.      Medication List    STOP taking these medications   aspirin 81 MG tablet   cyclobenzaprine 5 MG tablet Commonly known as:  FLEXERIL   methylPREDNISolone 4 MG Tbpk tablet Commonly known as:  MEDROL DOSEPAK     TAKE these medications   ADVAIR DISKUS 250-50 MCG/DOSE Aepb Generic drug:  Fluticasone-Salmeterol Inhale 2 puffs into the lungs daily as needed.   BENICAR 20 MG tablet Generic drug:  olmesartan Take 20 mg by mouth daily.   cholecalciferol 1000 units tablet Commonly known as:  VITAMIN D Take 5,000 Units by mouth daily.   clonazePAM 0.25 MG disintegrating tablet Commonly known as:  KLONOPIN Take 0.25 mg by mouth 2 (two) times daily.   donepezil 5 MG tablet Commonly known as:  ARICEPT Take 5 mg by mouth every evening. With food   escitalopram 10 MG tablet Commonly known as:  LEXAPRO Take 10 mg by mouth daily.   fentaNYL 50 MCG/HR Commonly known as:  [place] - dosed mcg/hr Place 1 patch onto the skin every other day.   fluocinonide cream 0.05 % Commonly known as:  LIDEX Apply 1 application topically 2 (two) times daily.   folic acid 1 MG tablet Commonly known as:  FOLVITE Take 1 mg by mouth daily.   HYDROmorphone 2 MG tablet Commonly known as:  DILAUDID Take 2 mg by mouth 2 (two) times daily as needed for severe pain.   levothyroxine 88 MCG tablet Commonly known as:  SYNTHROID, LEVOTHROID Take 88 mcg by mouth daily before breakfast.   methocarbamol 500 MG tablet Commonly known as:  ROBAXIN Take 1 tablet (500 mg total) by mouth every 8 (eight) hours as needed for muscle spasms.   methotrexate 2.5 MG tablet Commonly known as:  RHEUMATREX Take 12.5 mg by mouth every 7 (seven) days.   MYRBETRIQ 25 MG Tb24 tablet Generic drug:  mirabegron ER Take 25 mg by mouth every other day.     Oxycodone HCl 10 MG Tabs Take 10 mg by mouth 3 (three) times daily.   traZODone 50 MG tablet Commonly known as:  DESYREL Take 50 mg by mouth at bedtime.      Follow-up Information    AVVA,RAVISANKAR R, MD Follow up in 1 week(s).   Specialty:  Internal Medicine Why:  cbc, bmp Contact information: [address] [place] [address] [phone]            Time coordinating discharge: 35 min  Signed:  Tulani Kidney Alison Stalling   [facility] 01/14/2017, 5:39 PM

## 2017-01-14 NOTE — Evaluation (Signed)
Physical Therapy Evaluation Patient Details Name: ADAM DEMARY MRN: [id number] DOB: 02/27/1918 Today's Date: 01/14/2017   History of Present Illness  83 y.o. female with medical history significant of osteoarthritis, chronic pain, prior history of breast cancer currently in remission, presented to the emergency room with chief complaint of a fall.  From the fall, she sustained R 7th and 8th rib fxs, small hemothorax, and tiny T8 transverse process fx.  Clinical Impression  Pt admitted with above diagnosis. Pt currently with functional limitations due to the deficits listed below (see PT Problem List). On eval, pt required min assist transfers and min guard assist ambulation with RW 12 feet x 2. Ambulation distance limited by pain.  Pt will benefit from skilled PT to increase their independence and safety with mobility to allow discharge to the venue listed below.  PT to follow acutely. Pt/spouse declining home health services at d/c. Pt's spouse will be able to provide needed level of care.      Follow Up Recommendations No PT follow up;Supervision/Assistance - 24 hour    Equipment Recommendations  None recommended by PT    Recommendations for Other Services       Precautions / Restrictions Precautions Precautions: Fall Restrictions Weight Bearing Restrictions: No      Mobility  Bed Mobility               General bed mobility comments: Pt received in recliner.  Transfers Overall transfer level: Needs assistance Equipment used: Rolling walker (2 wheeled) Transfers: Sit to/from Stand Sit to Stand: Min assist         General transfer comment: increased time to complete  Ambulation/Gait Ambulation/Gait assistance: Min guard Ambulation Distance (Feet): 12 Feet Assistive device: Rolling walker (2 wheeled) Gait Pattern/deviations: Step-through pattern;Decreased stride length   Gait velocity interpretation: Below normal speed for age/gender General Gait Details:  slow, steady gait. Distance limited by pain.  Stairs            Wheelchair Mobility    Modified Rankin (Stroke Patients Only)       Balance Overall balance assessment: Needs assistance Sitting-balance support: No upper extremity supported;Feet supported Sitting balance-Leahy Scale: Good     Standing balance support: Bilateral upper extremity supported;During functional activity Standing balance-Leahy Scale: Fair Standing balance comment: RW needed for ambulation.                             Pertinent Vitals/Pain Pain Assessment: 0-10 Pain Score: 6  Pain Location: R flank Pain Descriptors / Indicators: Constant;Grimacing;Guarding Pain Intervention(s): Monitored during session    Home Living Family/patient expects to be discharged to:: Private residence Living Arrangements: Spouse/significant other Available Help at Discharge: Family;Available 24 hours/day Type of Home: House Home Access: Level entry     Home Layout: One level Home Equipment: Clinical cytogeneticist - 4 wheels;Wheelchair - manual      Prior Function Level of Independence: Needs assistance   Gait / Transfers Assistance Needed: Rollator used for ambulation in the house mod I. Pt pushed in w/c in the community.  ADL's / Homemaking Assistance Needed: Mod I with bADLs.        Hand Dominance        Extremity/Trunk Assessment   Upper Extremity Assessment Upper Extremity Assessment: Defer to OT evaluation    Lower Extremity Assessment Lower Extremity Assessment: Generalized weakness    Cervical / Trunk Assessment Cervical / Trunk Assessment: Kyphotic  Communication   Communication:  No difficulties  Cognition Arousal/Alertness: Awake/alert Behavior During Therapy: WFL for tasks assessed/performed Overall Cognitive Status: Within Functional Limits for tasks assessed                                        General Comments      Exercises     Assessment/Plan     PT Assessment Patient needs continued PT services  PT Problem List Decreased strength;Decreased activity tolerance;Decreased balance;Pain;Decreased mobility       PT Treatment Interventions Gait training;Functional mobility training;Balance training;Therapeutic exercise;Therapeutic activities;Patient/family education    PT Goals (Current goals can be found in the Care Plan section)  Acute Rehab PT Goals Patient Stated Goal: decrease pain PT Goal Formulation: With patient/family Time For Goal Achievement: 01/28/17 Potential to Achieve Goals: Good    Frequency Min 3X/week   Barriers to discharge        Co-evaluation               End of Session Equipment Utilized During Treatment: Gait belt Activity Tolerance: Patient limited by pain Patient left: in chair;with chair alarm set;with call bell/phone within reach;with family/visitor present Nurse Communication: Mobility status PT Visit Diagnosis: History of falling (Z91.81);Pain Pain - Right/Left: Right    Time: 4585-9292 PT Time Calculation (min) (ACUTE ONLY): 24 min   Charges:   PT Evaluation $PT Eval Moderate Complexity: 1 Procedure PT Treatments $Gait Training: 8-22 mins   PT G Codes:   PT G-Codes **NOT FOR INPATIENT CLASS** Functional Assessment Tool Used: AM-PAC 6 Clicks Basic Mobility Functional Limitation: Mobility: Walking and moving around Mobility: Walking and Moving Around Current Status (K4628): At least 20 percent but less than 40 percent impaired, limited or restricted Mobility: Walking and Moving Around Goal Status [phone]): At least 1 percent but less than 20 percent impaired, limited or restricted    Lorrin Goodell, PT  Office # [phone] Pager [phone]   Lorriane Shire 01/14/2017, 11:38 AM

## 2017-01-14 NOTE — Progress Notes (Signed)
PROGRESS NOTE    [name]  VCB:[id number] DOB: 1918-06-22 DOA: 01/13/2017 PCP: Tivis Ringer, MD   Outpatient Specialists:     Brief Narrative:  [name] is a 83 y.o. female with medical history significant of osteoarthritis, chronic pain, prior history of breast cancer currently in remission, presents to the emergency room with chief complaint of a fall early this morning.  Patient has underlying mild dementia, however she is able to recall that she had a ground-level fall this morning.  She denies any chest pain or palpitations or loss of consciousness prior to the fall.  She complains of recurrent falls, and the daughter who is at bedside concurs patient's story.  She states she does not know why she falls, she is not tripping on anything but her legs give out and she loses her balance and finds her cell phone to the floor.  She has had no fever or chills, she denies any cough chest congestion or dysuria.  Other than the recurrent falls, she is at baseline.   Assessment & Plan:   Active Problems:   Breast cancer of upper-outer quadrant of right female breast (HCC)   Pain in lower limb   Hemothorax   Fall with rib fracture on right 7th causing hemothorax -x ray stable -incentive spirometry -pain control -seen by trauma md -seen by PT, no follow up  h/o breast cancer  Hypothyroid -continue synthroid  Dementia -continue aricept   DVT prophylaxis:  scd  Code Status: Full Code   Family Communication:   Disposition Plan:  Home when ok with trauma   Consultants:   trauma     Subjective:   Objective: Vitals:   01/13/17 2041 01/13/17 2157 01/14/17 0521 01/14/17 0718  BP:  (!) 136/38 (!) 119/52   Pulse: 82 72 65   Resp: 16 16 16    Temp:  97.6 F (36.4 C) 97.7 F (36.5 C)   TempSrc:  Oral Oral   SpO2: 97% 99% 95% 90%  Weight:      Height:        Intake/Output Summary (Last 24 hours) at 01/14/17 1200 Last data filed at 01/14/17  3846  Gross per 24 hour  Intake              120 ml  Output                0 ml  Net              120 ml   Filed Weights   01/13/17 1627  Weight: 56.4 kg (124 lb 5.4 oz)    Examination:  General exam: Appears calm and comfortable - very hard of hearing Respiratory system: Clear to auscultation. Respiratory effort normal. Cardiovascular system: S1 & S2 heard, RRR. No JVD, murmurs, rubs, gallops or clicks. No pedal edema. Gastrointestinal system: Abdomen is nondistended, soft and nontender. No organomegaly or masses felt. Normal bowel sounds heard. Central nervous system: Alert and oriented    Data Reviewed: I have personally reviewed following labs and imaging studies  CBC:  Recent Labs Lab 01/13/17 1000 01/14/17 0641  WBC 9.9 5.8  NEUTROABS 7.8*  --   HGB 11.4* 10.6*  HCT 34.9* 34.5*  MCV 96.9 99.4  PLT 223 659   Basic Metabolic Panel:  Recent Labs Lab 01/13/17 1000 01/14/17 0641  NA 137 137  K 4.2 3.9  CL 99* 98*  CO2 30 31  GLUCOSE 103* 92  BUN 13 12  CREATININE  0.93 1.20*  CALCIUM 9.4 8.9   GFR: Estimated Creatinine Clearance: 24.4 mL/min (A) (by C-G formula based on SCr of 1.2 mg/dL (H)). Liver Function Tests:  Recent Labs Lab 01/13/17 1000 01/14/17 0641  AST 31 25  ALT 13* 13*  ALKPHOS 86 76  BILITOT 0.8 1.0  PROT 7.3 5.9*  ALBUMIN 4.1 3.4*   No results for input(s): LIPASE, AMYLASE in the last 168 hours. No results for input(s): AMMONIA in the last 168 hours. Coagulation Profile:  Recent Labs Lab 01/13/17 1000  INR 0.98   Cardiac Enzymes:  Recent Labs Lab 01/13/17 1000  CKTOTAL 225   BNP (last 3 results) No results for input(s): PROBNP in the last 8760 hours. HbA1C: No results for input(s): HGBA1C in the last 72 hours. CBG: No results for input(s): GLUCAP in the last 168 hours. Lipid Profile: No results for input(s): CHOL, HDL, LDLCALC, TRIG, CHOLHDL, LDLDIRECT in the last 72 hours. Thyroid Function Tests: No results for  input(s): TSH, T4TOTAL, FREET4, T3FREE, THYROIDAB in the last 72 hours. Anemia Panel: No results for input(s): VITAMINB12, FOLATE, FERRITIN, TIBC, IRON, RETICCTPCT in the last 72 hours. Urine analysis:    Component Value Date/Time   COLORURINE YELLOW 01/13/2017 [place] 01/13/2017 1029   LABSPEC 1.009 01/13/2017 1029   PHURINE 7.0 01/13/2017 1029   GLUCOSEU NEGATIVE 01/13/2017 1029   HGBUR SMALL (A) 01/13/2017 1029   BILIRUBINUR NEGATIVE 01/13/2017 1029   KETONESUR NEGATIVE 01/13/2017 1029   PROTEINUR NEGATIVE 01/13/2017 1029   NITRITE NEGATIVE 01/13/2017 1029   LEUKOCYTESUR NEGATIVE 01/13/2017 1029      Recent Results (from the past 240 hour(s))  Urine culture     Status: None (Preliminary result)   Collection Time: 01/13/17 10:29 AM  Result Value Ref Range Status   Specimen Description URINE, RANDOM  Final   Special Requests NONE  Final   Culture   Final    CULTURE REINCUBATED FOR BETTER GROWTH Performed at [facility], [place] [address], [place], [place] [address]    Report Status PENDING  Incomplete      Anti-infectives    None       Radiology Studies: Dg Chest 2 View  Result Date: 01/13/2017 CLINICAL DATA:  Unwitnessed fall.  Found on floor.  Dementia. EXAM: CHEST  2 VIEW COMPARISON:  Chest radiograph 10/31/2010. FINDINGS: Cardiomediastinal silhouette is increased. No active infiltrates or failure. Prior BILATERAL shoulder surgery. No visible acute rib fracture or pneumothorax. Degenerative change in the thoracic spine without definite acute compression deformity. IMPRESSION: Cardiomegaly. No definite active infiltrates or failure. No visible acute compression fracture. Electronically Signed   By: Staci Righter M.D.   On: 01/13/2017 09:39   Ct Head Wo Contrast  Result Date: 01/13/2017 CLINICAL DATA:  Rib pain after a fall. EXAM: CT HEAD WITHOUT CONTRAST CT CERVICAL SPINE WITHOUT CONTRAST TECHNIQUE: Multidetector CT imaging of the head and  cervical spine was performed following the standard protocol without intravenous contrast. Multiplanar CT image reconstructions of the cervical spine were also generated. COMPARISON:  CT neck 02/01/2016. FINDINGS: CT HEAD FINDINGS Brain: No evidence for acute infarction, hemorrhage, mass lesion, hydrocephalus, or extra-axial fluid. Global atrophy. Hypoattenuation of white matter suggesting small vessel disease. Old LEFT occipital infarct. Vascular: No intracranial hyperdense vessel to suggest acute thrombosis. BILATERAL cavernous carotid and distal vertebral calcification, not unexpected for age. Skull: No skull fracture.  No worrisome osseous lesion. Sinuses/Orbits: BILATERAL cataract extraction. No layering sinus fluid. Other: None. CT CERVICAL  SPINE FINDINGS Alignment: Degenerative anterolisthesis C3-C4. Chronic anterolisthesis of the odontoid on body of CTA related to nonunion. Previous posterior fusion. Skull base and vertebrae: There is a chronic ununited type 2 odontoid fracture similar to 2017. Chronic C1 arch fractures are also stable. Posterior cerclage wire at C1-C2 appears similar. There has been previous C5-C7 ACDF with anterior plating without adverse features. No acute fracture is seen. Soft tissues and spinal canal: No prevertebral soft tissue swelling. No intraspinal hematoma. Mild central canal stenosis may be present C5-C6. Atherosclerosis. Nuchal ligament calcification. Disc levels:  Multilevel spondylosis. Upper chest: Biapical scarring. Tiny RIGHT pneumothorax. No rib fracture can be seen. Other: None. Compared with 2017, the C1 and odontoid nonunions have a similar appearance. No pneumothorax was present in 2017. IMPRESSION: No skull fracture or intracranial hemorrhage. 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 Posterior cerclage wire fusion appears solid. Tiny RIGHT apical pneumothorax. Biapical scarring appears similar. If further investigation  desired, recommend CT chest without contrast. Critical Value/emergent results were called by telephone at the time of interpretation on 01/13/2017 at 9:58 am to Dr. Marda Stalker , who verbally acknowledged these results. Electronically Signed   By: Staci Righter M.D.   On: 01/13/2017 10:05   Ct Chest W Contrast  Result Date: 01/13/2017 CLINICAL DATA:  Unwitnessed fall.  RIGHT-sided chest pain. EXAM: CT CHEST WITH CONTRAST TECHNIQUE: Multidetector CT imaging of the chest was performed during intravenous contrast administration. CONTRAST:  1 ISOVUE-300 IOPAMIDOL (ISOVUE-300) INJECTION 61% COMPARISON:  Chest radiograph earlier today. CT cervical spine earlier today. CT abdomen and pelvis 06/03/2013. FINDINGS: Cardiovascular: No significant vascular findings. Cardiomegaly. Mild coronary artery calcification. Mediastinum/Nodes: No hilar adenopathy. Pretracheal nodal enlargement up to 12 mm, stable from priors. No paratracheal or AP window adenopathy. Lungs/Pleura: RIGHT pleural effusion/hemothorax. Small RIGHT apical pneumothorax noted on CT cervical spine has resolved. There are tiny bubbles of air in the RIGHT effusion and in the RIGHT posterior soft tissues. Biapical pleural thickening. BILATERAL pleural plaques similar to prior chest CT, likely post XRT findings. Upper Abdomen: Post cholecystectomy appearance with biliary ductal dilatation similar to prior CT. Musculoskeletal: Displaced posterior RIGHT eighth rib fracture, reference image 93 series 5. Nondisplaced posterolateral RIGHT seventh rib fracture. Tiny nondisplaced RIGHT transverse process fracture at T8. Surgically replaced LEFT humeral head. Severe degenerative change RIGHT shoulder. IMPRESSION: There is a small RIGHT hemothorax related to displaced RIGHT eighth rib fracture. This rib fracture cannot clearly be identified on prior chest radiograph. No significant pneumothorax. A small apical pneumothorax noted on CT cervical spine and resolved. A  few bubbles of air can be seen in the RIGHT pleural fluid as well as in the soft tissues of the back. Nondisplaced RIGHT seventh rib fracture and nondisplaced RIGHT T8 transverse process fracture. Electronically Signed   By: Staci Righter M.D.   On: 01/13/2017 11:47   Ct Cervical Spine Wo Contrast  Result Date: 01/13/2017 CLINICAL DATA:  Rib pain after a fall. EXAM: CT HEAD WITHOUT CONTRAST CT CERVICAL SPINE WITHOUT CONTRAST TECHNIQUE: Multidetector CT imaging of the head and cervical spine was performed following the standard protocol without intravenous contrast. Multiplanar CT image reconstructions of the cervical spine were also generated. COMPARISON:  CT neck 02/01/2016. FINDINGS: CT HEAD FINDINGS Brain: No evidence for acute infarction, hemorrhage, mass lesion, hydrocephalus, or extra-axial fluid. Global atrophy. Hypoattenuation of white matter suggesting small vessel disease. Old LEFT occipital infarct. Vascular: No intracranial hyperdense vessel to suggest acute thrombosis. BILATERAL cavernous carotid and  distal vertebral calcification, not unexpected for age. Skull: No skull fracture.  No worrisome osseous lesion. Sinuses/Orbits: BILATERAL cataract extraction. No layering sinus fluid. Other: None. CT CERVICAL SPINE FINDINGS Alignment: Degenerative anterolisthesis C3-C4. Chronic anterolisthesis of the odontoid on body of CTA related to nonunion. Previous posterior fusion. Skull base and vertebrae: There is a chronic ununited type 2 odontoid fracture similar to 2017. Chronic C1 arch fractures are also stable. Posterior cerclage wire at C1-C2 appears similar. There has been previous C5-C7 ACDF with anterior plating without adverse features. No acute fracture is seen. Soft tissues and spinal canal: No prevertebral soft tissue swelling. No intraspinal hematoma. Mild central canal stenosis may be present C5-C6. Atherosclerosis. Nuchal ligament calcification. Disc levels:  Multilevel spondylosis. Upper chest:  Biapical scarring. Tiny RIGHT pneumothorax. No rib fracture can be seen. Other: None. Compared with 2017, the C1 and odontoid nonunions have a similar appearance. No pneumothorax was present in 2017. IMPRESSION: No skull fracture or intracranial hemorrhage. 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 Posterior cerclage wire fusion appears solid. Tiny RIGHT apical pneumothorax. Biapical scarring appears similar. If further investigation desired, recommend CT chest without contrast. Critical Value/emergent results were called by telephone at the time of interpretation on 01/13/2017 at 9:58 am to Dr. Marda Stalker , who verbally acknowledged these results. Electronically Signed   By: Staci Righter M.D.   On: 01/13/2017 10:05   Dg Chest Port 1 View  Result Date: 01/14/2017 CLINICAL DATA:  Right rib fractures.  Chest pain. EXAM: PORTABLE CHEST 1 VIEW COMPARISON:  Chest radiograph from one day prior. FINDINGS: Partially visualized surgical hardware from ACDF overlies the lower cervical spine. Left total shoulder arthroplasty. Stable cardiomediastinal silhouette with top-normal heart size and aortic atherosclerosis. No pneumothorax. No pleural effusion. No pulmonary edema. Stable mild left basilar atelectasis. No acute consolidative airspace disease. IMPRESSION: 1. No pneumothorax. 2. No pleural effusion. 3. Stable mild left basilar atelectasis . 4. Aortic atherosclerosis. Electronically Signed   By: Ilona Sorrel M.D.   On: 01/14/2017 07:26   Dg Hand Complete Right  Result Date: 01/13/2017 CLINICAL DATA:  RIGHT hand pain diffusely.  Unwitnessed fall. EXAM: RIGHT HAND - COMPLETE 3+ VIEW COMPARISON:  None. FINDINGS: Severe degenerative change at the radiocarpal, intercarpal, carpometacarpal, and metacarpophalangeal joints. Osteopenia. No definite fracture. No dislocation. No significant soft tissue swelling or foreign body. IMPRESSION: No definite acute findings.  Advanced  degenerative change. Electronically Signed   By: Staci Righter M.D.   On: 01/13/2017 09:40        Scheduled Meds: . cholecalciferol  5,000 Units Oral Daily  . donepezil  5 mg Oral QPM  . escitalopram  10 mg Oral Daily  . fentaNYL  50 mcg Transdermal Q48H  . irbesartan  150 mg Oral Daily  . levothyroxine  88 mcg Oral QAC breakfast  . mirabegron ER  25 mg Oral QODAY  . mometasone-formoterol  2 puff Inhalation BID  . sodium chloride flush  3 mL Intravenous Q12H  . traMADol  50 mg Oral Q12H  . traZODone  50 mg Oral QHS   Continuous Infusions:   LOS: 0 days    Time spent: 25 min    [place], DO [facility] Pager [phone]  If 7PM-7AM, please contact night-coverage [email or web address] Password [email or web address] 01/14/2017, 12:00 PM

## 2017-01-14 NOTE — Progress Notes (Signed)
Subjective: Pain R ribs  Objective: Vital signs in last 24 hours: Temp:  [97.6 F (36.4 C)-98.1 F (36.7 C)] 97.7 F (36.5 C) (03/26 0521) Pulse Rate:  [65-82] 65 (03/26 0521) Resp:  [15-23] 16 (03/26 0521) BP: (119-207)/(38-92) 119/52 (03/26 0521) SpO2:  [90 %-100 %] 90 % (03/26 0718) Weight:  [56.4 kg (124 lb 5.4 oz)] 56.4 kg (124 lb 5.4 oz) (03/25 1627) Last BM Date:  (pta)  Intake/Output from previous day: 03/25 0701 - 03/26 0700 In: 120 [P.O.:120] Out: -  Intake/Output this shift: No intake/output data recorded.  General appearance: alert and cooperative Resp: clear to auscultation bilaterally Chest wall: right sided chest wall tenderness Cardio: regular rate and rhythm GI: soft, NT, ND  Lab Results: CBC   Recent Labs  01/13/17 1000 01/14/17 0641  WBC 9.9 5.8  HGB 11.4* 10.6*  HCT 34.9* 34.5*  PLT 223 206   BMET  Recent Labs  01/13/17 1000 01/14/17 0641  NA 137 137  K 4.2 3.9  CL 99* 98*  CO2 30 31  GLUCOSE 103* 92  BUN 13 12  CREATININE 0.93 1.20*  CALCIUM 9.4 8.9   PT/INR  Recent Labs  01/13/17 1000  LABPROT 13.0  INR 0.98   ABG No results for input(s): PHART, HCO3 in the last 72 hours.  Invalid input(s): PCO2, PO2  Studies/Results: Dg Chest 2 View  Result Date: 01/13/2017 CLINICAL DATA:  Unwitnessed fall.  Found on floor.  Dementia. EXAM: CHEST  2 VIEW COMPARISON:  Chest radiograph 10/31/2010. FINDINGS: Cardiomediastinal silhouette is increased. No active infiltrates or failure. Prior BILATERAL shoulder surgery. No visible acute rib fracture or pneumothorax. Degenerative change in the thoracic spine without definite acute compression deformity. IMPRESSION: Cardiomegaly. No definite active infiltrates or failure. No visible acute compression fracture. Electronically Signed   By: Staci Righter M.D.   On: 01/13/2017 09:39   Ct Head Wo Contrast  Result Date: 01/13/2017 CLINICAL DATA:  Rib pain after a fall. EXAM: CT HEAD WITHOUT  CONTRAST CT CERVICAL SPINE WITHOUT CONTRAST TECHNIQUE: Multidetector CT imaging of the head and cervical spine was performed following the standard protocol without intravenous contrast. Multiplanar CT image reconstructions of the cervical spine were also generated. COMPARISON:  CT neck 02/01/2016. FINDINGS: CT HEAD FINDINGS Brain: No evidence for acute infarction, hemorrhage, mass lesion, hydrocephalus, or extra-axial fluid. Global atrophy. Hypoattenuation of white matter suggesting small vessel disease. Old LEFT occipital infarct. Vascular: No intracranial hyperdense vessel to suggest acute thrombosis. BILATERAL cavernous carotid and distal vertebral calcification, not unexpected for age. Skull: No skull fracture.  No worrisome osseous lesion. Sinuses/Orbits: BILATERAL cataract extraction. No layering sinus fluid. Other: None. CT CERVICAL SPINE FINDINGS Alignment: Degenerative anterolisthesis C3-C4. Chronic anterolisthesis of the odontoid on body of CTA related to nonunion. Previous posterior fusion. Skull base and vertebrae: There is a chronic ununited type 2 odontoid fracture similar to 2017. Chronic C1 arch fractures are also stable. Posterior cerclage wire at C1-C2 appears similar. There has been previous C5-C7 ACDF with anterior plating without adverse features. No acute fracture is seen. Soft tissues and spinal canal: No prevertebral soft tissue swelling. No intraspinal hematoma. Mild central canal stenosis may be present C5-C6. Atherosclerosis. Nuchal ligament calcification. Disc levels:  Multilevel spondylosis. Upper chest: Biapical scarring. Tiny RIGHT pneumothorax. No rib fracture can be seen. Other: None. Compared with 2017, the C1 and odontoid nonunions have a similar appearance. No pneumothorax was present in 2017. IMPRESSION: No skull fracture or intracranial hemorrhage. 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 Chronic C1 fractures also nonunion. Posterior cerclage wire fusion appears solid.  Tiny RIGHT apical pneumothorax. Biapical scarring appears similar. If further investigation desired, recommend CT chest without contrast. Critical Value/emergent results were called by telephone at the time of interpretation on 01/13/2017 at 9:58 am to Dr. Marda Stalker , who verbally acknowledged these results. Electronically Signed   By: Staci Righter M.D.   On: 01/13/2017 10:05   Ct Chest W Contrast  Result Date: 01/13/2017 CLINICAL DATA:  Unwitnessed fall.  RIGHT-sided chest pain. EXAM: CT CHEST WITH CONTRAST TECHNIQUE: Multidetector CT imaging of the chest was performed during intravenous contrast administration. CONTRAST:  1 ISOVUE-300 IOPAMIDOL (ISOVUE-300) INJECTION 61% COMPARISON:  Chest radiograph earlier today. CT cervical spine earlier today. CT abdomen and pelvis 06/03/2013. FINDINGS: Cardiovascular: No significant vascular findings. Cardiomegaly. Mild coronary artery calcification. Mediastinum/Nodes: No hilar adenopathy. Pretracheal nodal enlargement up to 12 mm, stable from priors. No paratracheal or AP window adenopathy. Lungs/Pleura: RIGHT pleural effusion/hemothorax. Small RIGHT apical pneumothorax noted on CT cervical spine has resolved. There are tiny bubbles of air in the RIGHT effusion and in the RIGHT posterior soft tissues. Biapical pleural thickening. BILATERAL pleural plaques similar to prior chest CT, likely post XRT findings. Upper Abdomen: Post cholecystectomy appearance with biliary ductal dilatation similar to prior CT. Musculoskeletal: Displaced posterior RIGHT eighth rib fracture, reference image 93 series 5. Nondisplaced posterolateral RIGHT seventh rib fracture. Tiny nondisplaced RIGHT transverse process fracture at T8. Surgically replaced LEFT humeral head. Severe degenerative change RIGHT shoulder. IMPRESSION: There is a small RIGHT hemothorax related to displaced RIGHT eighth rib fracture. This rib fracture cannot clearly be identified on prior chest radiograph. No  significant pneumothorax. A small apical pneumothorax noted on CT cervical spine and resolved. A few bubbles of air can be seen in the RIGHT pleural fluid as well as in the soft tissues of the back. Nondisplaced RIGHT seventh rib fracture and nondisplaced RIGHT T8 transverse process fracture. Electronically Signed   By: Staci Righter M.D.   On: 01/13/2017 11:47   Ct Cervical Spine Wo Contrast  Result Date: 01/13/2017 CLINICAL DATA:  Rib pain after a fall. EXAM: CT HEAD WITHOUT CONTRAST CT CERVICAL SPINE WITHOUT CONTRAST TECHNIQUE: Multidetector CT imaging of the head and cervical spine was performed following the standard protocol without intravenous contrast. Multiplanar CT image reconstructions of the cervical spine were also generated. COMPARISON:  CT neck 02/01/2016. FINDINGS: CT HEAD FINDINGS Brain: No evidence for acute infarction, hemorrhage, mass lesion, hydrocephalus, or extra-axial fluid. Global atrophy. Hypoattenuation of white matter suggesting small vessel disease. Old LEFT occipital infarct. Vascular: No intracranial hyperdense vessel to suggest acute thrombosis. BILATERAL cavernous carotid and distal vertebral calcification, not unexpected for age. Skull: No skull fracture.  No worrisome osseous lesion. Sinuses/Orbits: BILATERAL cataract extraction. No layering sinus fluid. Other: None. CT CERVICAL SPINE FINDINGS Alignment: Degenerative anterolisthesis C3-C4. Chronic anterolisthesis of the odontoid on body of CTA related to nonunion. Previous posterior fusion. Skull base and vertebrae: There is a chronic ununited type 2 odontoid fracture similar to 2017. Chronic C1 arch fractures are also stable. Posterior cerclage wire at C1-C2 appears similar. There has been previous C5-C7 ACDF with anterior plating without adverse features. No acute fracture is seen. Soft tissues and spinal canal: No prevertebral soft tissue swelling. No intraspinal hematoma. Mild central canal stenosis may be present C5-C6.  Atherosclerosis. Nuchal ligament calcification. Disc levels:  Multilevel spondylosis. Upper chest: Biapical scarring. Tiny RIGHT pneumothorax. No rib fracture can be seen. Other: None. Compared  with 2017, the C1 and odontoid nonunions have a similar appearance. No pneumothorax was present in 2017. IMPRESSION: No skull fracture or intracranial hemorrhage. 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 Posterior cerclage wire fusion appears solid. Tiny RIGHT apical pneumothorax. Biapical scarring appears similar. If further investigation desired, recommend CT chest without contrast. Critical Value/emergent results were called by telephone at the time of interpretation on 01/13/2017 at 9:58 am to Dr. Marda Stalker , who verbally acknowledged these results. Electronically Signed   By: Staci Righter M.D.   On: 01/13/2017 10:05   Dg Chest Port 1 View  Result Date: 01/14/2017 CLINICAL DATA:  Right rib fractures.  Chest pain. EXAM: PORTABLE CHEST 1 VIEW COMPARISON:  Chest radiograph from one day prior. FINDINGS: Partially visualized surgical hardware from ACDF overlies the lower cervical spine. Left total shoulder arthroplasty. Stable cardiomediastinal silhouette with top-normal heart size and aortic atherosclerosis. No pneumothorax. No pleural effusion. No pulmonary edema. Stable mild left basilar atelectasis. No acute consolidative airspace disease. IMPRESSION: 1. No pneumothorax. 2. No pleural effusion. 3. Stable mild left basilar atelectasis . 4. Aortic atherosclerosis. Electronically Signed   By: Ilona Sorrel M.D.   On: 01/14/2017 07:26   Dg Hand Complete Right  Result Date: 01/13/2017 CLINICAL DATA:  RIGHT hand pain diffusely.  Unwitnessed fall. EXAM: RIGHT HAND - COMPLETE 3+ VIEW COMPARISON:  None. FINDINGS: Severe degenerative change at the radiocarpal, intercarpal, carpometacarpal, and metacarpophalangeal joints. Osteopenia. No definite fracture. No dislocation. No  significant soft tissue swelling or foreign body. IMPRESSION: No definite acute findings.  Advanced degenerative change. Electronically Signed   By: Staci Righter M.D.   On: 01/13/2017 09:40    Anti-infectives: Anti-infectives    None      Assessment/Plan: Fall R 7th rib FX with occult PTX - No PTX or effusion on CXR this AM, pain control, pulm toilet, needs incentive spirometer, will add Ultram T8 TVP FX - pain control   LOS: 0 days    Georganna Skeans, MD, MPH, FACS Trauma: [phone] General Surgery: [phone]  3/26/2018Patient ID: [name], female   DOB: October 28, 1917, 83 y.o.   MRN: [id number]

## 2017-01-19 DIAGNOSIS — F0390 Unspecified dementia without behavioral disturbance: Secondary | ICD-10-CM | POA: Diagnosis not present

## 2017-01-19 DIAGNOSIS — S2231XD Fracture of one rib, right side, subsequent encounter for fracture with routine healing: Secondary | ICD-10-CM | POA: Diagnosis not present

## 2017-01-19 DIAGNOSIS — D6489 Other specified anemias: Secondary | ICD-10-CM | POA: Diagnosis not present

## 2017-01-19 DIAGNOSIS — F418 Other specified anxiety disorders: Secondary | ICD-10-CM | POA: Diagnosis not present

## 2017-01-19 DIAGNOSIS — Z79891 Long term (current) use of opiate analgesic: Secondary | ICD-10-CM | POA: Diagnosis not present

## 2017-01-19 DIAGNOSIS — Z7982 Long term (current) use of aspirin: Secondary | ICD-10-CM | POA: Diagnosis not present

## 2017-01-19 DIAGNOSIS — M1991 Primary osteoarthritis, unspecified site: Secondary | ICD-10-CM | POA: Diagnosis not present

## 2017-01-19 DIAGNOSIS — R296 Repeated falls: Secondary | ICD-10-CM | POA: Diagnosis not present

## 2017-01-19 DIAGNOSIS — Z9181 History of falling: Secondary | ICD-10-CM | POA: Diagnosis not present

## 2017-01-19 DIAGNOSIS — Z79899 Other long term (current) drug therapy: Secondary | ICD-10-CM | POA: Diagnosis not present

## 2017-01-19 DIAGNOSIS — S271XXD Traumatic hemothorax, subsequent encounter: Secondary | ICD-10-CM | POA: Diagnosis not present

## 2017-01-19 DIAGNOSIS — W1839XD Other fall on same level, subsequent encounter: Secondary | ICD-10-CM | POA: Diagnosis not present

## 2017-01-22 ENCOUNTER — Inpatient Hospital Stay (HOSPITAL_COMMUNITY)
Admission: EM | Admit: 2017-01-22 | Discharge: 2017-01-26 | DRG: 193 | Disposition: A | Discharge status: Home Health | Payer: Medicare Other | Attending: Internal Medicine | Admitting: Internal Medicine

## 2017-01-22 ENCOUNTER — Encounter (HOSPITAL_COMMUNITY): Payer: Self-pay | Admitting: Emergency Medicine

## 2017-01-22 ENCOUNTER — Emergency Department (HOSPITAL_COMMUNITY): Payer: Medicare Other

## 2017-01-22 DIAGNOSIS — M549 Dorsalgia, unspecified: Secondary | ICD-10-CM | POA: Diagnosis present

## 2017-01-22 DIAGNOSIS — F419 Anxiety disorder, unspecified: Secondary | ICD-10-CM | POA: Diagnosis present

## 2017-01-22 DIAGNOSIS — E039 Hypothyroidism, unspecified: Secondary | ICD-10-CM | POA: Diagnosis present

## 2017-01-22 DIAGNOSIS — M1990 Unspecified osteoarthritis, unspecified site: Secondary | ICD-10-CM | POA: Diagnosis present

## 2017-01-22 DIAGNOSIS — J189 Pneumonia, unspecified organism: Secondary | ICD-10-CM | POA: Diagnosis present

## 2017-01-22 DIAGNOSIS — Z885 Allergy status to narcotic agent status: Secondary | ICD-10-CM | POA: Diagnosis not present

## 2017-01-22 DIAGNOSIS — J9600 Acute respiratory failure, unspecified whether with hypoxia or hypercapnia: Secondary | ICD-10-CM | POA: Diagnosis present

## 2017-01-22 DIAGNOSIS — Z882 Allergy status to sulfonamides status: Secondary | ICD-10-CM | POA: Diagnosis not present

## 2017-01-22 DIAGNOSIS — S2241XD Multiple fractures of ribs, right side, subsequent encounter for fracture with routine healing: Secondary | ICD-10-CM

## 2017-01-22 DIAGNOSIS — W07XXXD Fall from chair, subsequent encounter: Secondary | ICD-10-CM | POA: Diagnosis present

## 2017-01-22 DIAGNOSIS — F329 Major depressive disorder, single episode, unspecified: Secondary | ICD-10-CM | POA: Diagnosis present

## 2017-01-22 DIAGNOSIS — Z853 Personal history of malignant neoplasm of breast: Secondary | ICD-10-CM | POA: Diagnosis not present

## 2017-01-22 DIAGNOSIS — Z66 Do not resuscitate: Secondary | ICD-10-CM | POA: Diagnosis not present

## 2017-01-22 DIAGNOSIS — F1120 Opioid dependence, uncomplicated: Secondary | ICD-10-CM | POA: Diagnosis present

## 2017-01-22 DIAGNOSIS — G8929 Other chronic pain: Secondary | ICD-10-CM | POA: Diagnosis present

## 2017-01-22 DIAGNOSIS — Z888 Allergy status to other drugs, medicaments and biological substances status: Secondary | ICD-10-CM

## 2017-01-22 DIAGNOSIS — G9341 Metabolic encephalopathy: Secondary | ICD-10-CM | POA: Diagnosis present

## 2017-01-22 DIAGNOSIS — J942 Hemothorax: Secondary | ICD-10-CM | POA: Diagnosis present

## 2017-01-22 DIAGNOSIS — F0390 Unspecified dementia without behavioral disturbance: Secondary | ICD-10-CM | POA: Diagnosis present

## 2017-01-22 DIAGNOSIS — J9811 Atelectasis: Secondary | ICD-10-CM | POA: Diagnosis present

## 2017-01-22 DIAGNOSIS — R0600 Dyspnea, unspecified: Secondary | ICD-10-CM

## 2017-01-22 DIAGNOSIS — C50411 Malignant neoplasm of upper-outer quadrant of right female breast: Secondary | ICD-10-CM | POA: Diagnosis present

## 2017-01-22 DIAGNOSIS — Z96659 Presence of unspecified artificial knee joint: Secondary | ICD-10-CM | POA: Diagnosis present

## 2017-01-22 DIAGNOSIS — R531 Weakness: Secondary | ICD-10-CM | POA: Diagnosis present

## 2017-01-22 DIAGNOSIS — R0603 Acute respiratory distress: Secondary | ICD-10-CM | POA: Diagnosis not present

## 2017-01-22 LAB — CBG MONITORING, ED: GLUCOSE-CAPILLARY: 104 mg/dL — AB (ref 65–99)

## 2017-01-22 LAB — TROPONIN I
TROPONIN I: 0.12 ng/mL — AB (ref <0.03)
Troponin I: 0.14 ng/mL (ref <0.03)

## 2017-01-22 LAB — CBC
HEMATOCRIT: 31.9 % — AB (ref 36.0–46.0)
HEMOGLOBIN: 10.4 g/dL — AB (ref 12.0–15.0)
MCH: 31.8 pg (ref 26.0–34.0)
MCHC: 32.6 g/dL (ref 30.0–36.0)
MCV: 97.6 fL (ref 78.0–100.0)
Platelets: 185 10*3/uL (ref 150–400)
RBC: 3.27 MIL/uL — ABNORMAL LOW (ref 3.87–5.11)
RDW: 15.7 % — AB (ref 11.5–15.5)
WBC: 8.5 10*3/uL (ref 4.0–10.5)

## 2017-01-22 LAB — BASIC METABOLIC PANEL
Anion gap: 8 (ref 5–15)
BUN: 23 mg/dL — AB (ref 6–20)
CALCIUM: 8.5 mg/dL — AB (ref 8.9–10.3)
CO2: 26 mmol/L (ref 22–32)
Chloride: 97 mmol/L — ABNORMAL LOW (ref 101–111)
Creatinine, Ser: 1.32 mg/dL — ABNORMAL HIGH (ref 0.44–1.00)
GFR calc Af Amer: 38 mL/min — ABNORMAL LOW (ref >60)
GFR, EST NON AFRICAN AMERICAN: 33 mL/min — AB (ref >60)
GLUCOSE: 110 mg/dL — AB (ref 65–99)
Potassium: 4.9 mmol/L (ref 3.5–5.1)
Sodium: 131 mmol/L — ABNORMAL LOW (ref 135–145)

## 2017-01-22 MED ORDER — IRBESARTAN 75 MG PO TABS
75.0000 mg | ORAL_TABLET | Freq: Every day | ORAL | Status: DC
  Administered 2017-01-22: 75 mg via ORAL
  Filled 2017-01-22 (×2): qty 1

## 2017-01-22 MED ORDER — DEXTROSE 5 % IV SOLN
500.0000 mg | Freq: Once | INTRAVENOUS | Status: AC
  Administered 2017-01-22: 500 mg via INTRAVENOUS
  Filled 2017-01-22: qty 500

## 2017-01-22 MED ORDER — DEXTROSE 5 % IV SOLN
1.0000 g | Freq: Once | INTRAVENOUS | Status: AC
  Administered 2017-01-22: 1 g via INTRAVENOUS
  Filled 2017-01-22: qty 10

## 2017-01-22 MED ORDER — LEVOTHYROXINE SODIUM 88 MCG PO TABS
88.0000 ug | ORAL_TABLET | Freq: Every day | ORAL | Status: DC
  Administered 2017-01-23 – 2017-01-26 (×4): 88 ug via ORAL
  Filled 2017-01-22 (×4): qty 1

## 2017-01-22 MED ORDER — MORPHINE SULFATE (PF) 4 MG/ML IV SOLN
4.0000 mg | Freq: Once | INTRAVENOUS | Status: DC
  Filled 2017-01-22: qty 1

## 2017-01-22 MED ORDER — DEXTROSE 5 % IV SOLN
1.0000 g | INTRAVENOUS | Status: DC
  Administered 2017-01-23 – 2017-01-25 (×3): 1 g via INTRAVENOUS
  Filled 2017-01-22 (×4): qty 10

## 2017-01-22 MED ORDER — ESCITALOPRAM OXALATE 10 MG PO TABS
10.0000 mg | ORAL_TABLET | Freq: Every day | ORAL | Status: DC
  Administered 2017-01-22 – 2017-01-26 (×5): 10 mg via ORAL
  Filled 2017-01-22 (×5): qty 1

## 2017-01-22 MED ORDER — FENTANYL 50 MCG/HR TD PT72
50.0000 ug | MEDICATED_PATCH | TRANSDERMAL | Status: DC
  Administered 2017-01-25: 50 ug via TRANSDERMAL
  Filled 2017-01-22: qty 1

## 2017-01-22 MED ORDER — TRAZODONE HCL 50 MG PO TABS
50.0000 mg | ORAL_TABLET | Freq: Every day | ORAL | Status: DC
  Administered 2017-01-22 – 2017-01-25 (×4): 50 mg via ORAL
  Filled 2017-01-22 (×4): qty 1

## 2017-01-22 MED ORDER — OXYCODONE HCL 5 MG PO TABS
5.0000 mg | ORAL_TABLET | Freq: Two times a day (BID) | ORAL | Status: DC | PRN
  Administered 2017-01-22 – 2017-01-25 (×2): 5 mg via ORAL
  Filled 2017-01-22 (×3): qty 1

## 2017-01-22 MED ORDER — DONEPEZIL HCL 5 MG PO TABS
2.5000 mg | ORAL_TABLET | Freq: Every evening | ORAL | Status: DC
  Administered 2017-01-22 – 2017-01-25 (×4): 2.5 mg via ORAL
  Filled 2017-01-22 (×4): qty 1

## 2017-01-22 MED ORDER — MIRABEGRON ER 25 MG PO TB24
25.0000 mg | ORAL_TABLET | Freq: Every day | ORAL | Status: DC
  Administered 2017-01-22 – 2017-01-26 (×5): 25 mg via ORAL
  Filled 2017-01-22 (×5): qty 1

## 2017-01-22 MED ORDER — ENOXAPARIN SODIUM 30 MG/0.3ML ~~LOC~~ SOLN
30.0000 mg | SUBCUTANEOUS | Status: DC
  Administered 2017-01-22 – 2017-01-25 (×4): 30 mg via SUBCUTANEOUS
  Filled 2017-01-22 (×4): qty 0.3

## 2017-01-22 MED ORDER — MOMETASONE FURO-FORMOTEROL FUM 200-5 MCG/ACT IN AERO
2.0000 | INHALATION_SPRAY | Freq: Two times a day (BID) | RESPIRATORY_TRACT | Status: DC
  Administered 2017-01-22 – 2017-01-26 (×8): 2 via RESPIRATORY_TRACT
  Filled 2017-01-22: qty 8.8

## 2017-01-22 MED ORDER — AZITHROMYCIN 500 MG PO TABS
500.0000 mg | ORAL_TABLET | ORAL | Status: DC
  Administered 2017-01-23 – 2017-01-25 (×3): 500 mg via ORAL
  Filled 2017-01-22 (×3): qty 1

## 2017-01-22 MED ORDER — FENTANYL 50 MCG/HR TD PT72
50.0000 ug | MEDICATED_PATCH | TRANSDERMAL | Status: DC
  Administered 2017-01-22: 50 ug via TRANSDERMAL
  Filled 2017-01-22: qty 2

## 2017-01-22 MED ORDER — FOLIC ACID 1 MG PO TABS
1.0000 mg | ORAL_TABLET | Freq: Every day | ORAL | Status: DC
  Administered 2017-01-22 – 2017-01-26 (×5): 1 mg via ORAL
  Filled 2017-01-22 (×5): qty 1

## 2017-01-22 MED ORDER — CLONAZEPAM 0.125 MG PO TBDP
0.1250 mg | ORAL_TABLET | Freq: Two times a day (BID) | ORAL | Status: DC | PRN
  Administered 2017-01-24 – 2017-01-25 (×2): 0.125 mg via ORAL
  Filled 2017-01-22 (×2): qty 1

## 2017-01-22 NOTE — ED Notes (Signed)
Attempted report 

## 2017-01-22 NOTE — ED Notes (Signed)
Pt returned from xray, placed back on monitor.

## 2017-01-22 NOTE — ED Provider Notes (Signed)
[facility] DEPT Provider Note   CSN: [id number] Arrival date & time: 01/22/17  1116     History   Chief Complaint Chief Complaint  Patient presents with  . Weakness  . Cough    HPI [name] is a 83 y.o. female presenting with episodes of altered mental status where she was not responsive and dosing to sleep this morning. Husband reports that she normally ambulates with her walker without any problems but he had to try to hold her up and ended up having to use the bedside commode to get her to use the bathroom, is unusual for her. She also has been having a productive cough since yesterday. He also explains that she has had a fall with rib fractures about 10 days ago and was recovering from that until that episode this morning accompanied by coughing.   HPI  Past Medical History:  Diagnosis Date  . Arthritis     Patient Active Problem List   Diagnosis Date Noted  . Pneumonia 01/22/2017  . Hemothorax 01/13/2017  . Plantar fasciitis of right foot 12/31/2014  . Tenosynovitis of right ankle 12/31/2014  . Onychomycosis 12/31/2014  . Pain in lower limb 12/31/2014  . Breast cancer of upper-outer quadrant of right female breast ([place]) 11/20/2011    Past Surgical History:  Procedure Laterality Date  . CARPAL TUNNEL RELEASE    . REPLACEMENT TOTAL KNEE      OB History    No data available       Home Medications    Prior to Admission medications   Medication Sig Start Date End Date Taking? Authorizing Provider  BENICAR 20 MG tablet Take 20 mg by mouth daily.  08/23/12   Historical Provider, MD  cholecalciferol (VITAMIN D) 1000 UNITS tablet Take 5,000 Units by mouth daily.     Historical Provider, MD  clonazePAM (KLONOPIN) 0.25 MG disintegrating tablet Take 0.25 mg by mouth 2 (two) times daily. 12/13/16   Historical Provider, MD  donepezil (ARICEPT) 5 MG tablet Take 5 mg by mouth every evening. With food 12/07/16   Historical Provider, MD  escitalopram (LEXAPRO) 10 MG  tablet Take 10 mg by mouth daily.  02/22/15   Historical Provider, MD  fentaNYL (DURAGESIC - DOSED MCG/HR) 50 MCG/HR Place 1 patch onto the skin every other day.     Historical Provider, MD  fluocinonide cream (LIDEX) 8.34 % Apply 1 application topically 2 (two) times daily.    Historical Provider, MD  Fluticasone-Salmeterol (ADVAIR DISKUS) 250-50 MCG/DOSE AEPB Inhale 2 puffs into the lungs daily as needed.    Historical Provider, MD  folic acid (FOLVITE) 1 MG tablet Take 1 mg by mouth daily. 01/08/17   Historical Provider, MD  levothyroxine (SYNTHROID, LEVOTHROID) 88 MCG tablet Take 88 mcg by mouth daily before breakfast.  01/14/15   Historical Provider, MD  methocarbamol (ROBAXIN) 500 MG tablet Take 1 tablet (500 mg total) by mouth every 8 (eight) hours as needed for muscle spasms. 01/14/17   Geradine Girt, DO  methotrexate (RHEUMATREX) 2.5 MG tablet Take 12.5 mg by mouth every 7 (seven) days.  12/12/16   Historical Provider, MD  MYRBETRIQ 25 MG TB24 tablet Take 25 mg by mouth every other day.  03/13/14   Historical Provider, MD  Oxycodone HCl 10 MG TABS Take 1 tablet (10 mg total) by mouth 3 (three) times daily as needed. 01/14/17   Geradine Girt, DO  traZODone (DESYREL) 50 MG tablet Take 50 mg by mouth at  bedtime.  02/28/15   Historical Provider, MD    Family History No family history on file.  Social History Social History  Substance Use Topics  . Smoking status: Never Smoker  . Smokeless tobacco: Never Used  . Alcohol use Not on file     Allergies   Diclofenac sodium; Procaine hcl; Sulfa antibiotics; Vicodin [hydrocodone-acetaminophen]; Novocain [procaine]; and Other   Review of Systems Review of Systems  Constitutional: Negative for chills and fever.  HENT: Negative for ear pain and sore throat.   Eyes: Negative for pain and visual disturbance.  Respiratory: Positive for cough. Negative for choking, chest tightness, shortness of breath, wheezing and stridor.        Thick sputum    Cardiovascular: Negative for chest pain, palpitations and leg swelling.  Gastrointestinal: Negative for abdominal distention, abdominal pain, blood in stool, diarrhea, nausea and vomiting.  Genitourinary: Negative for difficulty urinating, dysuria, flank pain and hematuria.  Musculoskeletal: Positive for arthralgias, back pain and neck pain.       Arthritic, back and neck pain chronic for patient.  Skin: Negative for color change, pallor and rash.  Neurological: Positive for weakness. Negative for seizures and syncope.     Physical Exam Updated Vital Signs BP (!) 106/44   Pulse 67   Temp 98.3 F (36.8 C) (Oral)   Resp 17   SpO2 96%   Physical Exam  Constitutional: She is oriented to person, place, and time. She appears well-developed and well-nourished. No distress.  Afebrile, chronically ill-appearing, sitting comfortably in bed in no acute distress.  HENT:  Head: Normocephalic and atraumatic.  Thick greenish sputum noted in the oropharynx  Eyes: Conjunctivae and EOM are normal. Pupils are equal, round, and reactive to light. Right eye exhibits no discharge. Left eye exhibits no discharge. No scleral icterus.  Neck: Normal range of motion. Neck supple.  Cardiovascular: Normal rate, regular rhythm and intact distal pulses.   No murmur heard. Faint holosystolic murmur  Pulmonary/Chest: Effort normal and breath sounds normal. No respiratory distress. She has no wheezes. She has no rales. She exhibits no tenderness.  Abdominal: Soft. She exhibits no distension and no mass. There is no tenderness. There is no rebound and no guarding.  Musculoskeletal: She exhibits no edema.  Neurological: She is alert and oriented to person, place, and time. No cranial nerve deficit or sensory deficit. She exhibits normal muscle tone. Coordination normal.  Neurologic Exam:   - Mental status: Patient is alert and cooperative. Fluent speech and words are clear. Coherent thought processes and insight  is good. Patient is oriented x 4 to person, place, time and event.   - Cranial nerves:  CN III, IV, VI: pupils equally round, reactive to light both direct and conscensual and normal accommodation. Full extra-ocular movement. CN V: motor temporalis and masseter strength intact. CN VII : muscles of facial expression intact. CN X :  midline uvula. XI strength of sternocleidomastoid and trapezius muscles 5/5, XII: tongue is midline when protruded.  - Motor: No involuntary movements. Muscle tone and bulk normal throughout. Muscle strength is 5/5 in bilateral shoulder abduction, elbow flexion and extension, thumb opposition, grip, hip  flexion, ankle dorsiflexion and plantar flexion.   - Sensory: Proprioception, light tough sensation intact in all extremities.   - Cerebellar: rapid alternating movements and point to point movement intact in upper extremities.    Skin: Skin is warm and dry. No rash noted. She is not diaphoretic. No erythema. No pallor.  Psychiatric: She  has a normal mood and affect.  Nursing note and vitals reviewed.    ED Treatments / Results  Labs (all labs ordered are listed, but only abnormal results are displayed) Labs Reviewed  CBC - Abnormal; Notable for the following:       Result Value   RBC 3.27 (*)    Hemoglobin 10.4 (*)    HCT 31.9 (*)    RDW 15.7 (*)    All other components within normal limits  CBG MONITORING, ED - Abnormal; Notable for the following:    Glucose-Capillary 104 (*)    All other components within normal limits  BASIC METABOLIC PANEL  TROPONIN I    EKG  EKG Interpretation  Date/Time:  Tuesday January 22 2017 11:19:32 EDT Ventricular Rate:  80 PR Interval:    QRS Duration: 99 QT Interval:  372 QTC Calculation: 430 R Axis:   -28 Text Interpretation:  Unknown rhythm, irregular rate Prolonged PR interval Borderline left axis deviation Borderline repolarization abnormality Confirmed by Johnney Killian, MD, Jeannie Done [id number]) on 01/22/2017 2:01:35 PM        Radiology Dg Chest 2 View  Result Date: 01/22/2017 CLINICAL DATA:  Cough. EXAM: CHEST  2 VIEW COMPARISON:  01/14/2017 01/13/2017.  10/31/2010 . FINDINGS: Mediastinum and hilar structures normal. Cardiomegaly with normal pulmonary vascularity. Low lung volumes. Mild right base infiltrate and right pleural effusion. No pleural effusion pneumothorax. Stable severe arthritic changes noted about the right shoulder. Total left shoulder replacement. Cervicothoracic spine fusion. IMPRESSION: 1. Right lower lobe infiltrate with small right pleural effusion. 2.  Left mid lung field pleuroparenchymal scarring. 3.  Stable cardiomegaly.  No pulmonary venous congestion . Electronically Signed   By: Marcello Moores  Register   On: 01/22/2017 11:56    Procedures Procedures (including critical care time)  Medications Ordered in ED Medications  cefTRIAXone (ROCEPHIN) 1 g in dextrose 5 % 50 mL IVPB (1 g Intravenous New Bag/Given 01/22/17 1438)  azithromycin (ZITHROMAX) 500 mg in dextrose 5 % 250 mL IVPB (not administered)     Initial Impression / Assessment and Plan / ED Course  I have reviewed the triage vital signs and the nursing notes.  Pertinent labs & imaging results that were available during my care of the patient were reviewed by me and considered in my medical decision making (see chart for details).     83 year old female presenting with productive cough and altered mental status. Possible new-onset A. Fib and no known cardiac history. O2 sats dropping to 90%. Patient was placed on 1 L per nursing protocol. No oxygen requirement at home.  No acute respiratory distress. No adventitious lung sounds Normal neuro exam  Husband is very adamant about her not getting any more blood draw because she had a lot of pain with the initial draw and that is very unusual for her. He absolutely refuses to see her in that much pain again.  Initial troponin 0.12 Chest x-ray showing infiltrate and effusion Observed  patient coughing up thick mucus Curb score: 2  Based on clinical presentation and labs, suspect pneumonia and initiated empiric treatment. Patient was discussed with Dr. Johnney Killian who agrees with assessment and plan.  Spoke to admitting physician who will be admitting her to telemetry.  Final Clinical Impressions(s) / ED Diagnoses   Final diagnoses:  Community acquired pneumonia of right lung, unspecified part of lung    New Prescriptions New Prescriptions   No medications on file     Emeline General, PA-C 01/22/17 1551  Charlesetta Shanks, MD 01/30/17 1754

## 2017-01-22 NOTE — ED Notes (Signed)
Pt transported to xray 

## 2017-01-22 NOTE — ED Notes (Signed)
Pt pressure is soft, will hold morphine and see if patches and oxycodone help.

## 2017-01-22 NOTE — ED Triage Notes (Signed)
Per [facility], pt from home, states she feels weak and unable to get around the house like she usually is. Pt also has very bad cough, fell  A few weeks ago and has some broken ribs. Has chronic back pain and has fentanyl patch for that. Pt is AAOx3, in NAD. Does not know the date.

## 2017-01-22 NOTE — ED Notes (Signed)
0.12 Troponin, critical lab notification. MD notified.

## 2017-01-22 NOTE — H&P (Signed)
History and Physical    [name] IRC:[id number] DOB: Jul 28, 1918 DOA: 01/22/2017  PCP: Tivis Ringer, MD Patient coming from: home  Chief Complaint: altered mental status  HPI: [name] is a 83 y.o. female with medical history significant of arthritis, chronic pain, hypothyroidism, prior history of breast cancer, recent fall and right rib fractures who presents with AMS. Husband and daughter at bedside provide history. Last week, patient had fallen out of her recliner and broke right sided ribs with right pneumothorax. She was discharged home and has been recovering. This morning, husband could not awake her. EMS was called. She eventually came to but was not acting herself. She has also had 1 day history of congestion, productive cough, rib pain but not other chest pain, SOB, nausea, vomiting, diarrhea, abdominal pain.   ED Course: Work up revealed right sided pneumonia, mildly elevated troponin. TRH called for admission.   Review of Systems: As per HPI otherwise 10 point review of systems negative.   Past Medical History:  Diagnosis Date  . Arthritis     Past Surgical History:  Procedure Laterality Date  . CARPAL TUNNEL RELEASE    . REPLACEMENT TOTAL KNEE       reports that she has never smoked. She has never used smokeless tobacco. Her alcohol and drug histories are not on file.  Allergies  Allergen Reactions  . Diclofenac Sodium     Not noted  . Procaine Hcl   . Sulfa Antibiotics   . Vicodin [Hydrocodone-Acetaminophen]   . Novocain [Procaine]     Per patient   . Other     Percandan, coraseden and voltairn (per patient), patient not sure what reaction was  . Percodan [Oxycodone-Aspirin] Other (See Comments)    Not known by patient    Family history reviewed and noncontributory   Prior to Admission medications   Medication Sig Start Date End Date Taking? Authorizing Provider  BENICAR 20 MG tablet Take 20 mg by mouth daily.  08/23/12   Historical  Provider, MD  cholecalciferol (VITAMIN D) 1000 UNITS tablet Take 5,000 Units by mouth daily.     Historical Provider, MD  clonazePAM (KLONOPIN) 0.25 MG disintegrating tablet Take 0.25 mg by mouth 2 (two) times daily. 12/13/16   Historical Provider, MD  donepezil (ARICEPT) 5 MG tablet Take 5 mg by mouth every evening. With food 12/07/16   Historical Provider, MD  escitalopram (LEXAPRO) 10 MG tablet Take 10 mg by mouth daily.  02/22/15   Historical Provider, MD  fentaNYL (DURAGESIC - DOSED MCG/HR) 50 MCG/HR Place 1 patch onto the skin every other day.     Historical Provider, MD  fluocinonide cream (LIDEX) 5.10 % Apply 1 application topically 2 (two) times daily.    Historical Provider, MD  Fluticasone-Salmeterol (ADVAIR DISKUS) 250-50 MCG/DOSE AEPB Inhale 2 puffs into the lungs daily as needed.    Historical Provider, MD  folic acid (FOLVITE) 1 MG tablet Take 1 mg by mouth daily. 01/08/17   Historical Provider, MD  levothyroxine (SYNTHROID, LEVOTHROID) 88 MCG tablet Take 88 mcg by mouth daily before breakfast.  01/14/15   Historical Provider, MD  methocarbamol (ROBAXIN) 500 MG tablet Take 1 tablet (500 mg total) by mouth every 8 (eight) hours as needed for muscle spasms. 01/14/17   Geradine Girt, DO  methotrexate (RHEUMATREX) 2.5 MG tablet Take 12.5 mg by mouth every 7 (seven) days.  12/12/16   Historical Provider, MD  MYRBETRIQ 25 MG TB24 tablet Take 25 mg by  mouth every other day.  03/13/14   Historical Provider, MD  Oxycodone HCl 10 MG TABS Take 1 tablet (10 mg total) by mouth 3 (three) times daily as needed. 01/14/17   Geradine Girt, DO  traZODone (DESYREL) 50 MG tablet Take 50 mg by mouth at bedtime.  02/28/15   Historical Provider, MD    Physical Exam: Vitals:   01/22/17 1230 01/22/17 1401 01/22/17 1430 01/22/17 1530  BP: (!) 100/46 (!) 100/51 (!) 106/44 (!) 111/47  Pulse: 71 71 67 65  Resp: 19 19 17 13   Temp:      TempSrc:      SpO2: 92% 91% 96% 97%    Constitutional: NAD, calm,  comfortable Eyes: PERRL, lids and conjunctivae normal ENMT: Mucous membranes are moist. Posterior pharynx clear of any exudate or lesions.Normal dentition.  Neck: normal, supple, no masses, no thyromegaly Respiratory: respiratory effort is weak, on [place] O2, crackles right lung field, no wheezing. No accessory muscle use.  Cardiovascular: Irregular rhythm rate 60s, no murmurs / rubs / gallops. No extremity edema Abdomen: no tenderness, no masses palpated. No hepatosplenomegaly. Bowel sounds positive.  Musculoskeletal: no clubbing / cyanosis. No joint deformity upper and lower extremities. Good ROM, no contractures. Normal muscle tone.  Skin: no rashes, lesions, ulcers. No induration Neurologic: CN 2-12 grossly intact. Sensation intact Psychiatric: Normal judgment and insight. Alert and oriented x 3. Normal mood.   Labs on Admission: I have personally reviewed following labs and imaging studies  CBC:  Recent Labs Lab 01/22/17 1319  WBC 8.5  HGB 10.4*  HCT 31.9*  MCV 97.6  PLT 403   Basic Metabolic Panel: No results for input(s): NA, K, CL, CO2, GLUCOSE, BUN, CREATININE, CALCIUM, MG, PHOS in the last 168 hours. GFR: Estimated Creatinine Clearance: 24.4 mL/min (A) (by C-G formula based on SCr of 1.2 mg/dL (H)). Liver Function Tests: No results for input(s): AST, ALT, ALKPHOS, BILITOT, PROT, ALBUMIN in the last 168 hours. No results for input(s): LIPASE, AMYLASE in the last 168 hours. No results for input(s): AMMONIA in the last 168 hours. Coagulation Profile: No results for input(s): INR, PROTIME in the last 168 hours. Cardiac Enzymes: No results for input(s): CKTOTAL, CKMB, CKMBINDEX, TROPONINI in the last 168 hours. BNP (last 3 results) No results for input(s): PROBNP in the last 8760 hours. HbA1C: No results for input(s): HGBA1C in the last 72 hours. CBG:  Recent Labs Lab 01/22/17 1228  GLUCAP 104*   Lipid Profile: No results for input(s): CHOL, HDL, LDLCALC, TRIG,  CHOLHDL, LDLDIRECT in the last 72 hours. Thyroid Function Tests: No results for input(s): TSH, T4TOTAL, FREET4, T3FREE, THYROIDAB in the last 72 hours. Anemia Panel: No results for input(s): VITAMINB12, FOLATE, FERRITIN, TIBC, IRON, RETICCTPCT in the last 72 hours. Urine analysis:    Component Value Date/Time   COLORURINE YELLOW 01/13/2017 1029   APPEARANCEUR CLEAR 01/13/2017 1029   LABSPEC 1.009 01/13/2017 1029   PHURINE 7.0 01/13/2017 1029   GLUCOSEU NEGATIVE 01/13/2017 1029   HGBUR SMALL (A) 01/13/2017 1029   BILIRUBINUR NEGATIVE 01/13/2017 1029   KETONESUR NEGATIVE 01/13/2017 1029   PROTEINUR NEGATIVE 01/13/2017 1029   NITRITE NEGATIVE 01/13/2017 1029   LEUKOCYTESUR NEGATIVE 01/13/2017 1029   Sepsis Labs: !!!!!!!!!!!!!!!!!!!!!!!!!!!!!!!!!!!!!!!!!!!! @LABRCNTIP (procalcitonin:4,lacticidven:4) ) Recent Results (from the past 240 hour(s))  Urine culture     Status: Abnormal   Collection Time: 01/13/17 10:29 AM  Result Value Ref Range Status   Specimen Description URINE, RANDOM  Final   Special Requests NONE  Final   Culture (A)  Final    10,000 COLONIES/mL LACTOBACILLUS SPECIES Standardized susceptibility testing for this organism is not available. Performed at [facility], [place] [address] [place], [place] [address]    Report Status 01/14/2017 FINAL  Final     Radiological Exams on Admission: Dg Chest 2 View  Result Date: 01/22/2017 CLINICAL DATA:  Cough. EXAM: CHEST  2 VIEW COMPARISON:  01/14/2017 01/13/2017.  10/31/2010 . FINDINGS: Mediastinum and hilar structures normal. Cardiomegaly with normal pulmonary vascularity. Low lung volumes. Mild right base infiltrate and right pleural effusion. No pleural effusion pneumothorax. Stable severe arthritic changes noted about the right shoulder. Total left shoulder replacement. Cervicothoracic spine fusion. IMPRESSION: 1. Right lower lobe infiltrate with small right pleural effusion. 2.  Left mid lung field pleuroparenchymal  scarring. 3.  Stable cardiomegaly.  No pulmonary venous congestion . Electronically Signed   By: Marcello Moores  Register   On: 01/22/2017 11:56    EKG: Independently reviewed. Irregular rhythm, PAC   Assessment/Plan Active Problems:   Pneumonia   Acute encephalopathy -Somnolent this morning per husband, but now back to baseline in ED per daughter and husband -Suspect due to infectious process  Acute respiratory failure -[place] O2   CAP -Last admission was < 2 days in duration and thus does not qualify as HCAP  -Rocephin, azithromax  -IVF   PAC -Initially believed to be A Fib in ED, but EKG shows P waves -Tele  Elevated troponin -Trend   Recent fall with right rib fx -Supportive care -Incentive spirometry -PT   Chronic pain -Continue home meds fentanyl patch, oxycodone  Depression/Anxiety -Continue home klonopin, lexapro, trazodone  Hypothyroidism -Continue synthroid     DVT prophylaxis: lovenox Code Status: full  Family Communication: daughter and husband at bedside Disposition Plan: pending improvement Consults called: none  Admission status: inpatient   It is my clinical opinion that admission to INPATIENT is reasonable and necessary in this 83 y.o. year old female   presenting with symptoms of AMS, congestion, concerning for CAP  in the context of PMH including: recent rib fx  with pertinent positives on physical exam including: crackles right lung  and pertinent positives on radiographic and laboratory data including: right sided pneumonia, elevated troponin  Workup and treatment include IV antibiotics, PT, trend labs   Given the aforementioned, the predictability of an adverse outcome is felt to be significant. I expect that the patient will require at least 2 midnights in the hospital to treat this condition.   Dessa Phi, DO [facility] [email or web address] Password [facility] 01/22/2017, 3:42 PM

## 2017-01-22 NOTE — ED Notes (Signed)
Pt placed on 1L [place]

## 2017-01-23 ENCOUNTER — Other Ambulatory Visit: Payer: Self-pay

## 2017-01-23 DIAGNOSIS — J189 Pneumonia, unspecified organism: Principal | ICD-10-CM

## 2017-01-23 LAB — CBC
HCT: 29.6 % — ABNORMAL LOW (ref 36.0–46.0)
Hemoglobin: 9.5 g/dL — ABNORMAL LOW (ref 12.0–15.0)
MCH: 31 pg (ref 26.0–34.0)
MCHC: 32.1 g/dL (ref 30.0–36.0)
MCV: 96.7 fL (ref 78.0–100.0)
Platelets: 204 10*3/uL (ref 150–400)
RBC: 3.06 MIL/uL — ABNORMAL LOW (ref 3.87–5.11)
RDW: 15.5 % (ref 11.5–15.5)
WBC: 7.1 10*3/uL (ref 4.0–10.5)

## 2017-01-23 LAB — BASIC METABOLIC PANEL
Anion gap: 9 (ref 5–15)
BUN: 24 mg/dL — AB (ref 6–20)
CO2: 24 mmol/L (ref 22–32)
CREATININE: 1.24 mg/dL — AB (ref 0.44–1.00)
Calcium: 8.1 mg/dL — ABNORMAL LOW (ref 8.9–10.3)
Chloride: 98 mmol/L — ABNORMAL LOW (ref 101–111)
GFR calc Af Amer: 41 mL/min — ABNORMAL LOW (ref >60)
GFR, EST NON AFRICAN AMERICAN: 35 mL/min — AB (ref >60)
GLUCOSE: 100 mg/dL — AB (ref 65–99)
POTASSIUM: 4.2 mmol/L (ref 3.5–5.1)
Sodium: 131 mmol/L — ABNORMAL LOW (ref 135–145)

## 2017-01-23 LAB — TROPONIN I
Troponin I: 0.11 ng/mL (ref <0.03)
Troponin I: 0.15 ng/mL (ref <0.03)

## 2017-01-23 MED ORDER — SODIUM CHLORIDE 0.9 % IV SOLN
INTRAVENOUS | Status: AC
  Administered 2017-01-23 (×2): via INTRAVENOUS

## 2017-01-23 NOTE — Progress Notes (Addendum)
PROGRESS NOTE    [name]  ZMO:[id number] DOB: 1918/03/15 DOA: 01/22/2017 PCP: Tivis Ringer, MD  Brief Narrative:[name] is a 83 y.o. female with medical history significant of arthritis, chronic pain, hypothyroidism, prior history of breast cancer, recent fall and right rib fractures who presented with AMS.  Last week, patient had fallen out of her recliner and broke right sided ribs with right pneumothorax. She was discharged home and has been recovering then admitted 4/3 due to lethargy, CXR with pneumonia   Assessment & Plan:     Pneumonia -In the background of recent rib fractures/atelectasis -Agree with community-acquired pneumonia coverage clinically improving -On ceftriaxone, azithromycin -Follow-up sputum culture    Metabolic encephalopathy -Likely secondary to pneumonia as well as narcotics -Mentation improved   Recent rib fracture/eighth rib with small hemothorax and pneumothorax -Treated with supportive care, now with right lower lobe infiltrate - incentive spirometry   Mild dementia -Monitor for sundowning  PAC vs PAfib -Heart rate controlled, chads score greater than 3 however, due to advanced age, recent fall, hemothorax would not start anticoagulation  Elevated troponin -non ACS pattern, no chest pain, no plan for cardiac workup in the absence of overt symptoms due to advanced age  Recent fall with right rib fx -Supportive care -Incentive spirometry -PT   Chronic pain -Continue home meds fentanyl patch, oxycodone -We will discuss with her spouse regarding medications especially since she is on 5 potentially sedating meds at home  Depression/Anxiety -Continue home klonopin, lexapro, trazodone  Hypothyroidism -Continue synthroid  DVT prophylaxis: Lovenox  Code Status:  full code  Family Communication: no family at bedside  Disposition Plan: Home in 1-2 days   Antimicrobials:  Rocephin/zithromax  Subjective: Feels better,  " when can I go home"  Objective: Vitals:   01/23/17 0135 01/23/17 0621 01/23/17 0845 01/23/17 1210  BP: (!) 101/40 (!) 82/56  (!) 101/40  Pulse: 61 62  70  Resp: 20 20  20   Temp: 98.7 F (37.1 C) 98.3 F (36.8 C)  97.9 F (36.6 C)  TempSrc: Oral Oral  Oral  SpO2: 92% 97% 96% 95%  Weight:  59.4 kg (131 lb)    Height:        Intake/Output Summary (Last 24 hours) at 01/23/17 1233 Last data filed at 01/23/17 0855  Gross per 24 hour  Intake              580 ml  Output              350 ml  Net              230 ml   Filed Weights   01/22/17 1728 01/23/17 0621  Weight: 57.3 kg (126 lb 5.2 oz) 59.4 kg (131 lb)    Examination:  General exam: Appears calm and comfortable, frail Respiratory system: ronchi at R base Cardiovascular system: S1 & S2 heard, RRR. No JVD, murmurs, rubs, Gastrointestinal system: Abdomen is nondistended, soft and nontenderNormal bowel sounds heard. Central nervous system: Alert and oriented. No focal neurological deficits. Extremities: Symmetric 5 x 5 power. Skin: No rashes, lesions or ulcers Psychiatry: Judgement and insight appear normal. Mood & affect appropriate.     Data Reviewed:   CBC:  Recent Labs Lab 01/22/17 1319 01/23/17 0524  WBC 8.5 7.1  HGB 10.4* 9.5*  HCT 31.9* 29.6*  MCV 97.6 96.7  PLT 185 035   Basic Metabolic Panel:  Recent Labs Lab 01/22/17 1319 01/23/17 0524  NA 131* 131*  K 4.9 4.2  CL 97* 98*  CO2 26 24  GLUCOSE 110* 100*  BUN 23* 24*  CREATININE 1.32* 1.24*  CALCIUM 8.5* 8.1*   GFR: Estimated Creatinine Clearance: 22 mL/min (A) (by C-G formula based on SCr of 1.24 mg/dL (H)). Liver Function Tests: No results for input(s): AST, ALT, ALKPHOS, BILITOT, PROT, ALBUMIN in the last 168 hours. No results for input(s): LIPASE, AMYLASE in the last 168 hours. No results for input(s): AMMONIA in the last 168 hours. Coagulation Profile: No results for input(s): INR, PROTIME in the last 168 hours. Cardiac  Enzymes:  Recent Labs Lab 01/22/17 1319 01/22/17 1823 01/22/17 2337 01/23/17 0524  TROPONINI 0.12* 0.14* 0.15* 0.11*   BNP (last 3 results) No results for input(s): PROBNP in the last 8760 hours. HbA1C: No results for input(s): HGBA1C in the last 72 hours. CBG:  Recent Labs Lab 01/22/17 1228  GLUCAP 104*   Lipid Profile: No results for input(s): CHOL, HDL, LDLCALC, TRIG, CHOLHDL, LDLDIRECT in the last 72 hours. Thyroid Function Tests: No results for input(s): TSH, T4TOTAL, FREET4, T3FREE, THYROIDAB in the last 72 hours. Anemia Panel: No results for input(s): VITAMINB12, FOLATE, FERRITIN, TIBC, IRON, RETICCTPCT in the last 72 hours. Urine analysis:    Component Value Date/Time   COLORURINE YELLOW 01/13/2017 1029   APPEARANCEUR CLEAR 01/13/2017 1029   LABSPEC 1.009 01/13/2017 1029   PHURINE 7.0 01/13/2017 1029   GLUCOSEU NEGATIVE 01/13/2017 1029   HGBUR SMALL (A) 01/13/2017 1029   BILIRUBINUR NEGATIVE 01/13/2017 1029   KETONESUR NEGATIVE 01/13/2017 1029   PROTEINUR NEGATIVE 01/13/2017 1029   NITRITE NEGATIVE 01/13/2017 1029   LEUKOCYTESUR NEGATIVE 01/13/2017 1029   Sepsis Labs: @LABRCNTIP (procalcitonin:4,lacticidven:4)  )No results found for this or any previous visit (from the past 240 hour(s)).       Radiology Studies: Dg Chest 2 View  Result Date: 01/22/2017 CLINICAL DATA:  Cough. EXAM: CHEST  2 VIEW COMPARISON:  01/14/2017 01/13/2017.  10/31/2010 . FINDINGS: Mediastinum and hilar structures normal. Cardiomegaly with normal pulmonary vascularity. Low lung volumes. Mild right base infiltrate and right pleural effusion. No pleural effusion pneumothorax. Stable severe arthritic changes noted about the right shoulder. Total left shoulder replacement. Cervicothoracic spine fusion. IMPRESSION: 1. Right lower lobe infiltrate with small right pleural effusion. 2.  Left mid lung field pleuroparenchymal scarring. 3.  Stable cardiomegaly.  No pulmonary venous congestion .  Electronically Signed   By: Cochran   On: 01/22/2017 11:56        Scheduled Meds: . azithromycin  500 mg Oral Q24H  . cefTRIAXone (ROCEPHIN)  IV  1 g Intravenous Q24H  . donepezil  2.5 mg Oral QPM  . enoxaparin (LOVENOX) injection  30 mg Subcutaneous Q24H  . escitalopram  10 mg Oral Daily  . [START ON 01/25/2017] fentaNYL  50 mcg Transdermal Q72H  . folic acid  1 mg Oral Daily  . levothyroxine  88 mcg Oral QAC breakfast  . mirabegron ER  25 mg Oral Daily  . mometasone-formoterol  2 puff Inhalation BID  .  morphine injection  4 mg Intravenous Once  . traZODone  50 mg Oral QHS   Continuous Infusions: . sodium chloride 75 mL/hr at 01/23/17 1056     LOS: 1 day    Time spent: 29min    Domenic Polite, MD [facility] Pager [phone]  If 7PM-7AM, please contact night-coverage [email or web address] Password [facility] 01/23/2017, 12:33 PM

## 2017-01-23 NOTE — Evaluation (Addendum)
Physical Therapy Evaluation   Patient Details Name: [name] MRN: [id number] DOB: Mar 31, 1918 Today's Date: 01/23/2017   History of Present Illness  [name] a 83 y.o.femalewith medical history significant of arthritis, chronic pain, hypothyroidism,prior history of breast cancer, recent fall and right rib fractures who presents with AMS.  Last week, pt had a mechanical fall suffering a right sided rib fx with pneumothorax. Now imaging showing R sided PNA.  Clinical Impression  Pt admitted with/for PNA.  Pt is not needing more assist than at her baseline..  Pt currently limited functionally due to the problems listed below.  (see problems list.)  Pt will benefit from PT to maximize function and safety to be able to get home safely with available assist of family.     Follow Up Recommendations Home health PT;Supervision for mobility/OOB    Equipment Recommendations  None recommended by PT    Recommendations for Other Services       Precautions / Restrictions Precautions Precautions: Fall      Mobility  Bed Mobility Overal bed mobility: Needs Assistance Bed Mobility: Supine to Sit     Supine to sit: Min assist        Transfers Overall transfer level: Needs assistance Equipment used: Rolling walker (2 wheeled) Transfers: Sit to/from Stand Sit to Stand: Min assist         General transfer comment: extra time, cues for hand placement  Ambulation/Gait Ambulation/Gait assistance: Min assist Ambulation Distance (Feet): 45 Feet Assistive device: Rolling walker (2 wheeled) Gait Pattern/deviations: Step-through pattern;Decreased stride length Gait velocity: slow Gait velocity interpretation: Below normal speed for age/gender General Gait Details: slow and generally steady, but noticeably fatiguing  Stairs            Wheelchair Mobility    Modified Rankin (Stroke Patients Only)       Balance Overall balance assessment: Needs  assistance Sitting-balance support: No upper extremity supported Sitting balance-Leahy Scale: Good     Standing balance support: Bilateral upper extremity supported;During functional activity Standing balance-Leahy Scale: Poor Standing balance comment: RW needed for ambulation.                             Pertinent Vitals/Pain Pain Assessment: Faces Faces Pain Scale: Hurts little more Pain Location: R flank Pain Descriptors / Indicators: Guarding;Grimacing Pain Intervention(s): Monitored during session;Repositioned    Home Living Family/patient expects to be discharged to:: Private residence Living Arrangements: Spouse/significant other Available Help at Discharge: Family;Available 24 hours/day Type of Home: House Home Access: Level entry     Home Layout: One level Home Equipment: Clinical cytogeneticist - 4 wheels;Wheelchair - manual      Prior Function Level of Independence: Needs assistance   Gait / Transfers Assistance Needed: Rollator used for ambulation in the house mod I. Pt pushed in w/c in the community.  ADL's / Homemaking Assistance Needed: Mod I with bADLs.        Hand Dominance        Extremity/Trunk Assessment   Upper Extremity Assessment Upper Extremity Assessment: Defer to OT evaluation (mild weakness bil)    Lower Extremity Assessment Lower Extremity Assessment: Overall WFL for tasks assessed (mild proximal weakness)    Cervical / Trunk Assessment Cervical / Trunk Assessment: Kyphotic  Communication   Communication: No difficulties  Cognition Arousal/Alertness: Awake/alert Behavior During Therapy: WFL for tasks assessed/performed Overall Cognitive Status: Within Functional Limits for tasks assessed  General Comments General comments (skin integrity, edema, etc.): sats in the mid to upper 90's through out session.   Of importance.  Pt left sitting EOB with instructions to sit  while this therapist ran to assist in an emergency rm 6.  The emergency took 5-6 minutes at which time I returned to find pt supine in bed.  Pt was adequately safe to sit EOB unattended AND I thought she was cognitively able to maintain sitting until I returned.  RN stated pt up ?in RW and about to fall.  Unable to address this as unwitnessed.  No fall occurred.    Exercises     Assessment/Plan    PT Assessment    PT Problem List Decreased strength;Decreased activity tolerance;Decreased balance;Decreased mobility;Cardiopulmonary status limiting activity       PT Treatment Interventions Gait training;DME instruction;Functional mobility training;Therapeutic activities;Balance training;Patient/family education    PT Goals (Current goals can be found in the Care Plan section)  Acute Rehab PT Goals Patient Stated Goal: get my breath and strength back PT Goal Formulation: With patient Time For Goal Achievement: 01/30/17 Potential to Achieve Goals: Good    Frequency Min 3X/week   Barriers to discharge        Co-evaluation               End of Session   Activity Tolerance: Patient tolerated treatment well Patient left: in bed;with call bell/phone within reach;with bed alarm set Nurse Communication: Mobility status PT Visit Diagnosis: History of falling (Z91.81);Pain;Difficulty in walking, not elsewhere classified (R26.2) Pain - Right/Left: Right Pain - part of body:  (right flank)    Time: 1856-3149 PT Time Calculation (min) (ACUTE ONLY): 37 min   Charges:   PT Evaluation $PT Eval Moderate Complexity: 1 Procedure PT Treatments $Gait Training: 8-22 mins   PT G Codes:        2017-02-03  Donnella Sham, PT [phone]  (pager)  [name] [name] 02-03-2017, 3:35 PM

## 2017-01-23 NOTE — Progress Notes (Signed)
Pt is currently in a-fib, HR 57.  Pt was in SR on admission.  Have notified attending physician.

## 2017-01-24 ENCOUNTER — Inpatient Hospital Stay (HOSPITAL_COMMUNITY): Payer: Medicare Other

## 2017-01-24 LAB — CBC
HCT: 30.8 % — ABNORMAL LOW (ref 36.0–46.0)
Hemoglobin: 9.6 g/dL — ABNORMAL LOW (ref 12.0–15.0)
MCH: 30.3 pg (ref 26.0–34.0)
MCHC: 31.2 g/dL (ref 30.0–36.0)
MCV: 97.2 fL (ref 78.0–100.0)
PLATELETS: 159 10*3/uL (ref 150–400)
RBC: 3.17 MIL/uL — ABNORMAL LOW (ref 3.87–5.11)
RDW: 15.3 % (ref 11.5–15.5)
WBC: 5.7 10*3/uL (ref 4.0–10.5)

## 2017-01-24 LAB — BASIC METABOLIC PANEL
Anion gap: 9 (ref 5–15)
BUN: 23 mg/dL — AB (ref 6–20)
CHLORIDE: 96 mmol/L — AB (ref 101–111)
CO2: 27 mmol/L (ref 22–32)
CREATININE: 1.2 mg/dL — AB (ref 0.44–1.00)
Calcium: 8.3 mg/dL — ABNORMAL LOW (ref 8.9–10.3)
GFR calc Af Amer: 43 mL/min — ABNORMAL LOW (ref >60)
GFR calc non Af Amer: 37 mL/min — ABNORMAL LOW (ref >60)
Glucose, Bld: 97 mg/dL (ref 65–99)
Potassium: 4.6 mmol/L (ref 3.5–5.1)
SODIUM: 132 mmol/L — AB (ref 135–145)

## 2017-01-24 MED ORDER — IPRATROPIUM-ALBUTEROL 0.5-2.5 (3) MG/3ML IN SOLN
3.0000 mL | Freq: Four times a day (QID) | RESPIRATORY_TRACT | Status: DC
  Administered 2017-01-24: 3 mL via RESPIRATORY_TRACT
  Filled 2017-01-24: qty 3

## 2017-01-24 MED ORDER — POLYETHYLENE GLYCOL 3350 17 G PO PACK
17.0000 g | PACK | Freq: Every day | ORAL | Status: DC
  Filled 2017-01-24 (×3): qty 1

## 2017-01-24 MED ORDER — GUAIFENESIN ER 600 MG PO TB12
600.0000 mg | ORAL_TABLET | Freq: Two times a day (BID) | ORAL | Status: DC
  Administered 2017-01-24 – 2017-01-26 (×5): 600 mg via ORAL
  Filled 2017-01-24 (×5): qty 1

## 2017-01-24 NOTE — Evaluation (Signed)
Occupational Therapy Evaluation Patient Details Name: [name] MRN: [id number] DOB: Jul 24, 1918 Today's Date: 01/24/2017    History of Present Illness [name] a 83 y.o.femalewith medical history significant of arthritis, chronic pain, hypothyroidism,prior history of breast cancer, recent fall and right rib fractures who presents with AMS.  Last week, pt had a mechanical fall suffering a right sided rib fx with pneumothorax. Now imaging showing R sided PNA.   Clinical Impression   This 83 yo female admitted with above presents to acute OT with deficits below (see OT problem list) thus affecting her PLOF of being at a Mod I level with basic ADLs. She will benefit from acute OT with follow up [place] to get back to PLOF. On arrival to room O2 was off of pt and she was 88%; reapplied O2 at 2 liters and pt up to 94%.     Follow Up Recommendations  Home health OT;Supervision/Assistance - 24 hour    Equipment Recommendations  None recommended by OT       Precautions / Restrictions Precautions Precautions: Fall Precaution Comments: monitor O2 Restrictions Weight Bearing Restrictions: No      Mobility Bed Mobility Overal bed mobility: Needs Assistance Bed Mobility: Supine to Sit     Supine to sit: Min guard     General bed mobility comments: HOB but no use of rail  Transfers Overall transfer level: Needs assistance Equipment used: Rolling walker (2 wheeled) Transfers: Sit to/from Stand Sit to Stand: Min assist         General transfer comment: extra time, cues for hand placement    Balance Overall balance assessment: Needs assistance Sitting-balance support: No upper extremity supported;Feet supported Sitting balance-Leahy Scale: Fair     Standing balance support: Bilateral upper extremity supported;During functional activity Standing balance-Leahy Scale: Poor Standing balance comment: RW needed for ambulation.                            ADL either performed or assessed with clinical judgement   ADL Overall ADL's : Needs assistance/impaired Eating/Feeding: Independent;Sitting   Grooming: Minimal assistance;Standing   Upper Body Bathing: Supervision/ safety;Set up;Sitting   Lower Body Bathing: Moderate assistance Lower Body Bathing Details (indicate cue type and reason): min A sit<>stand Upper Body Dressing : Moderate assistance;Sitting   Lower Body Dressing: Moderate assistance Lower Body Dressing Details (indicate cue type and reason): min A sit<>stand Toilet Transfer: Minimal assistance;Ambulation;RW;Comfort height toilet;Grab bars   Toileting- Clothing Manipulation and Hygiene: Minimal assistance;Sit to/from stand               Vision Patient Visual Report: No change from baseline              Pertinent Vitals/Pain Pain Assessment: Faces Faces Pain Scale: Hurts little more Pain Location: R flank with some movements Pain Descriptors / Indicators: Grimacing;Guarding Pain Intervention(s): Monitored during session;Repositioned     Hand Dominance Right   Extremity/Trunk Assessment Upper Extremity Assessment Upper Extremity Assessment: Overall WFL for tasks assessed           Communication Communication Communication: No difficulties   Cognition Arousal/Alertness: Awake/alert Behavior During Therapy: WFL for tasks assessed/performed Overall Cognitive Status: No family/caregiver present to determine baseline cognitive functioning                                 General Comments: I asked her stop at  sink and wash her hands, but instead she took her teeth out. When I told her I did not have stuff with me for her to do her teeth and that she would have to sit down while I went to get it she picked up her teeth and washcloth and then tried to ambulate with RW with these two items in her hands--which was unsafe              Home Living Family/patient expects to be discharged  to:: Private residence Living Arrangements: Spouse/significant other Available Help at Discharge: Family;Available 24 hours/day Type of Home: House Home Access: Level entry     Home Layout: One level     Bathroom Shower/Tub: Teacher, early years/pre: Standard     Home Equipment: Clinical cytogeneticist - 4 wheels;Wheelchair - manual          Prior Functioning/Environment Level of Independence: Needs assistance  Gait / Transfers Assistance Needed: Rollator used for ambulation in the house mod I. Pt pushed in w/c in the community. ADL's / Homemaking Assistance Needed: Pt reports prior to breaking her ribs she was Mod I for basic ADLs, but since breaking her ribs her husband has to help her with her basic ADLs            OT Problem List: Decreased strength;Decreased range of motion;Decreased activity tolerance;Impaired balance (sitting and/or standing);Pain;Cardiopulmonary status limiting activity      OT Treatment/Interventions: Self-care/ADL training;Therapeutic activities;Patient/family education;DME and/or AE instruction;Balance training    OT Goals(Current goals can be found in the care plan section) Acute Rehab OT Goals Patient Stated Goal: to get my strength back OT Goal Formulation: With patient Time For Goal Achievement: 02/07/17 Potential to Achieve Goals: Good  OT Frequency: Min 2X/week              End of Session Equipment Utilized During Treatment: Gait belt;Rolling walker Nurse Communication: Mobility status  Activity Tolerance: Patient limited by fatigue Patient left: in chair;with call bell/phone within reach;with chair alarm set  OT Visit Diagnosis: Unsteadiness on feet (R26.81);Pain;History of falling (Z91.81);Muscle weakness (generalized) (M62.81) Pain - Right/Left: Right Pain - part of body:  (ribs)                Time: 6886-4847 OT Time Calculation (min): 35 min Charges:  OT General Charges $OT Visit: 1 Procedure OT Evaluation $OT  Eval Moderate Complexity: 1 Procedure OT Treatments $Self Care/Home Management : 8-22 mins  [place], OTR/L [phone] 01/24/2017

## 2017-01-24 NOTE — Progress Notes (Signed)
Patient is active with [facility] as prior to admission for HHRN/ PT/OT; Aneta Mins [phone]

## 2017-01-24 NOTE — Progress Notes (Signed)
RT instructed pt and family on the use of flutter valve. Pt able to demonstrate back good technique & had a productive cough.

## 2017-01-24 NOTE — Progress Notes (Signed)
PROGRESS NOTE    [name]  WJX:[id number] DOB: February 03, 1918 DOA: 01/22/2017 PCP: Tivis Ringer, MD  Brief Narrative:[name] is a 83 y.o. female with medical history significant of arthritis, chronic pain, hypothyroidism, prior history of breast cancer, recent fall and right rib fractures who presented with AMS.  Last week, patient had fallen out of her recliner and broke right sided ribs with right pneumothorax. She was discharged home and has been recovering then admitted 4/3 due to lethargy, CXR with pneumonia   Assessment & Plan:     Pneumonia -In the background of recent rib fractures/atelectasis -Agree with community-acquired pneumonia coverage clinically improving -On ceftriaxone, azithromycin, add mucinex, excessive cough this am -Follow-up sputum culture    Metabolic encephalopathy -Likely secondary to pneumonia as well as narcotics -Mentation improved   Recent rib fracture/eighth rib with small hemothorax and pneumothorax -Treated with supportive care, now with right lower lobe infiltrate - incentive spirometry   Mild dementia -Monitor for sundowning  PAC vs PAfib -Heart rate controlled, chads score greater than 3 however, due to advanced age, recent fall, hemothorax would not start anticoagulation  Elevated troponin -non ACS pattern, no chest pain, no plan for cardiac workup in the absence of overt symptoms due to advanced age  Recent fall with right rib fx -Supportive care -Incentive spirometry -PT   Chronic pain -Continue home meds fentanyl patch, oxycodone -discussed with her spouse regarding medications especially since she is on 5 potentially sedating meds at home -recommended to slowly start weaning this upon PCP FU  Depression/Anxiety -Continue home klonopin, lexapro, trazodone  Hypothyroidism -Continue synthroid  DVT prophylaxis: Lovenox  Code Status:  full code  Family Communication: spouse at bedside  Disposition Plan: Home  tomorrow if stable  Antimicrobials:  Rocephin/zithromax  Subjective: Coughing, more, quite weak  Objective: Vitals:   01/23/17 2025 01/24/17 0606 01/24/17 0849 01/24/17 1227  BP: (!) 103/52 (!) 105/45  (!) 123/99  Pulse: 66 75  77  Resp: 20 20  18   Temp: 98.4 F (36.9 C) 99.3 F (37.4 C)  98.2 F (36.8 C)  TempSrc: Oral Oral  Oral  SpO2: 100% 94% 93% 98%  Weight:  58.1 kg (128 lb 1.6 oz)    Height:        Intake/Output Summary (Last 24 hours) at 01/24/17 1347 Last data filed at 01/24/17 1100  Gross per 24 hour  Intake             1635 ml  Output              800 ml  Net              835 ml   Filed Weights   01/22/17 1728 01/23/17 0621 01/24/17 0606  Weight: 57.3 kg (126 lb 5.2 oz) 59.4 kg (131 lb) 58.1 kg (128 lb 1.6 oz)    Examination:  General exam: Appears calm and comfortable, frail Respiratory system: ronchi at R base Cardiovascular system: S1 & S2 heard, RRR. No JVD, murmurs, rubs, Gastrointestinal system: Abdomen is nondistended, soft and nontenderNormal bowel sounds heard. Central nervous system: Alert and oriented. No focal neurological deficits. Extremities: Symmetric 5 x 5 power. Skin: No rashes, lesions or ulcers Psychiatry: Judgement and insight appear normal. Mood & affect appropriate.     Data Reviewed:   CBC:  Recent Labs Lab 01/22/17 1319 01/23/17 0524 01/24/17 0343  WBC 8.5 7.1 5.7  HGB 10.4* 9.5* 9.6*  HCT 31.9* 29.6* 30.8*  MCV 97.6 96.7  97.2  PLT 185 204 546   Basic Metabolic Panel:  Recent Labs Lab 01/22/17 1319 01/23/17 0524 01/24/17 0343  NA 131* 131* 132*  K 4.9 4.2 4.6  CL 97* 98* 96*  CO2 26 24 27   GLUCOSE 110* 100* 97  BUN 23* 24* 23*  CREATININE 1.32* 1.24* 1.20*  CALCIUM 8.5* 8.1* 8.3*   GFR: Estimated Creatinine Clearance: 22.5 mL/min (A) (by C-G formula based on SCr of 1.2 mg/dL (H)). Liver Function Tests: No results for input(s): AST, ALT, ALKPHOS, BILITOT, PROT, ALBUMIN in the last 168 hours. No  results for input(s): LIPASE, AMYLASE in the last 168 hours. No results for input(s): AMMONIA in the last 168 hours. Coagulation Profile: No results for input(s): INR, PROTIME in the last 168 hours. Cardiac Enzymes:  Recent Labs Lab 01/22/17 1319 01/22/17 1823 01/22/17 2337 01/23/17 0524  TROPONINI 0.12* 0.14* 0.15* 0.11*   BNP (last 3 results) No results for input(s): PROBNP in the last 8760 hours. HbA1C: No results for input(s): HGBA1C in the last 72 hours. CBG:  Recent Labs Lab 01/22/17 1228  GLUCAP 104*   Lipid Profile: No results for input(s): CHOL, HDL, LDLCALC, TRIG, CHOLHDL, LDLDIRECT in the last 72 hours. Thyroid Function Tests: No results for input(s): TSH, T4TOTAL, FREET4, T3FREE, THYROIDAB in the last 72 hours. Anemia Panel: No results for input(s): VITAMINB12, FOLATE, FERRITIN, TIBC, IRON, RETICCTPCT in the last 72 hours. Urine analysis:    Component Value Date/Time   COLORURINE YELLOW 01/13/2017 1029   APPEARANCEUR CLEAR 01/13/2017 1029   LABSPEC 1.009 01/13/2017 1029   PHURINE 7.0 01/13/2017 1029   GLUCOSEU NEGATIVE 01/13/2017 1029   HGBUR SMALL (A) 01/13/2017 1029   BILIRUBINUR NEGATIVE 01/13/2017 1029   KETONESUR NEGATIVE 01/13/2017 1029   PROTEINUR NEGATIVE 01/13/2017 1029   NITRITE NEGATIVE 01/13/2017 1029   LEUKOCYTESUR NEGATIVE 01/13/2017 1029   Sepsis Labs: @LABRCNTIP (procalcitonin:4,lacticidven:4)  )No results found for this or any previous visit (from the past 240 hour(s)).       Radiology Studies: No results found.      Scheduled Meds: . azithromycin  500 mg Oral Q24H  . cefTRIAXone (ROCEPHIN)  IV  1 g Intravenous Q24H  . donepezil  2.5 mg Oral QPM  . enoxaparin (LOVENOX) injection  30 mg Subcutaneous Q24H  . escitalopram  10 mg Oral Daily  . [START ON 01/25/2017] fentaNYL  50 mcg Transdermal Q72H  . folic acid  1 mg Oral Daily  . levothyroxine  88 mcg Oral QAC breakfast  . mirabegron ER  25 mg Oral Daily  .  mometasone-formoterol  2 puff Inhalation BID  .  morphine injection  4 mg Intravenous Once  . polyethylene glycol  17 g Oral Daily  . traZODone  50 mg Oral QHS   Continuous Infusions:    LOS: 2 days    Time spent: 42min    Domenic Polite, MD [facility] Pager [phone]  If 7PM-7AM, please contact night-coverage [email or web address] Password [email or web address] 01/24/2017, 1:47 PM

## 2017-01-24 NOTE — Progress Notes (Signed)
Pt with audible wheezing.  Also trying to get OOB to go home wants my husband pt stated.Noted getting  confused not knowing date and year at this time  Called husband and pt able to talk to him.  Notified Dr. Broadus John .  Rino Hosea.

## 2017-01-25 DIAGNOSIS — R0603 Acute respiratory distress: Secondary | ICD-10-CM

## 2017-01-25 LAB — CBC
HEMATOCRIT: 29 % — AB (ref 36.0–46.0)
HEMOGLOBIN: 9.3 g/dL — AB (ref 12.0–15.0)
MCH: 30.6 pg (ref 26.0–34.0)
MCHC: 32.1 g/dL (ref 30.0–36.0)
MCV: 95.4 fL (ref 78.0–100.0)
Platelets: 159 10*3/uL (ref 150–400)
RBC: 3.04 MIL/uL — AB (ref 3.87–5.11)
RDW: 15.4 % (ref 11.5–15.5)
WBC: 4.7 10*3/uL (ref 4.0–10.5)

## 2017-01-25 MED ORDER — IPRATROPIUM-ALBUTEROL 0.5-2.5 (3) MG/3ML IN SOLN: 3.0000 mL | RESPIRATORY_TRACT | Status: DC | PRN

## 2017-01-25 MED ORDER — IPRATROPIUM-ALBUTEROL 0.5-2.5 (3) MG/3ML IN SOLN
3.0000 mL | Freq: Three times a day (TID) | RESPIRATORY_TRACT | Status: DC
  Administered 2017-01-25 – 2017-01-26 (×4): 3 mL via RESPIRATORY_TRACT
  Filled 2017-01-25 (×3): qty 3

## 2017-01-25 MED ORDER — METHYLPREDNISOLONE SODIUM SUCC 40 MG IJ SOLR
40.0000 mg | Freq: Two times a day (BID) | INTRAMUSCULAR | Status: DC
  Administered 2017-01-25 (×2): 40 mg via INTRAVENOUS
  Filled 2017-01-25 (×2): qty 1

## 2017-01-25 MED ORDER — ORAL CARE MOUTH RINSE
15.0000 mL | Freq: Two times a day (BID) | OROMUCOSAL | Status: DC
Start: 2017-01-25 — End: 2017-01-26
  Administered 2017-01-25 – 2017-01-26 (×3): 15 mL via OROMUCOSAL

## 2017-01-25 MED ORDER — OXYCODONE HCL 5 MG PO TABS
5.0000 mg | ORAL_TABLET | Freq: Once | ORAL | Status: AC
  Administered 2017-01-25: 5 mg via ORAL
  Filled 2017-01-25: qty 1

## 2017-01-25 MED ORDER — FUROSEMIDE 10 MG/ML IJ SOLN
20.0000 mg | Freq: Once | INTRAMUSCULAR | Status: AC
  Administered 2017-01-25: 20 mg via INTRAVENOUS
  Filled 2017-01-25: qty 2

## 2017-01-25 NOTE — Progress Notes (Signed)
Occupational Therapy Treatment Patient Details Name: [name] MRN: [id number] DOB: 1918/01/24 Today's Date: 01/25/2017    History of present illness Yolunda J Bensis a 83 y.o.femalewith medical history significant of arthritis, chronic pain, hypothyroidism,prior history of breast cancer, recent fall and right rib fractures who presents with AMS.  Last week, pt had a mechanical fall suffering a right sided rib fx with pneumothorax. Now imaging showing R sided PNA.   OT comments  Pt making steady progress towards goals.  Pt reports urgent need to toilet upon arrival.  Ambulated to regular toilet with RW with min assist/cues for safety and proper positioning of RW.  Required assistance to don clean socks secondary to decreased stability when reaching forward to attempt donning socks and decreased sats with movement.  O2 dropped to 87% on 1L after ambulation to toilet, requiring cues for breathing technique to improve oxygen intake.  Pt returned to bed post toileting and left semi-reclined, with pt reporting fatigue.  Follow Up Recommendations  Home health OT;Supervision/Assistance - 24 hour    Equipment Recommendations  None recommended by OT    Recommendations for Other Services      Precautions / Restrictions Precautions Precautions: Fall Precaution Comments: monitor O2 Restrictions Weight Bearing Restrictions: No       Mobility Bed Mobility Overal bed mobility: Needs Assistance Bed Mobility: Supine to Sit;Sit to Supine     Supine to sit: Min guard Sit to supine: Min guard   General bed mobility comments: HOB elevated but no use of rail  Transfers Overall transfer level: Needs assistance Equipment used: Rolling walker (2 wheeled) Transfers: Sit to/from Stand Sit to Stand: Min assist         General transfer comment: cues for safety with RW with positioning, as pt with quick movements secondary to urgency with need for toileting        ADL either performed  or assessed with clinical judgement   ADL                       Lower Body Dressing: Moderate assistance Lower Body Dressing Details (indicate cue type and reason): min A sit<>stand, assist to don socks due to instability in unsupported sitting Toilet Transfer: Minimal assistance;Ambulation;RW;Comfort height toilet;Grab bars   Toileting- Clothing Manipulation and Hygiene: Minimal assistance;Sit to/from stand       Functional mobility during ADLs: Minimal assistance General ADL Comments: Ambulated to toilet with RW with min assist secondary to urgency.               Cognition Arousal/Alertness: Awake/alert Behavior During Therapy: WFL for tasks assessed/performed Overall Cognitive Status: No family/caregiver present to determine baseline cognitive functioning                                                     Pertinent Vitals/ Pain       Pain Assessment: Faces Faces Pain Scale: Hurts little more Pain Location: R flank with some movements Pain Descriptors / Indicators: Grimacing;Guarding Pain Intervention(s): Monitored during session;Repositioned         Frequency  Min 2X/week        Progress Toward Goals  OT Goals(current goals can now be found in the care plan section)  Progress towards OT goals: Progressing toward goals  Acute Rehab OT Goals Patient Stated Goal:  to get my strength back OT Goal Formulation: With patient Time For Goal Achievement: 02/07/17 Potential to Achieve Goals: Good  Plan Discharge plan remains appropriate       End of Session Equipment Utilized During Treatment: Gait belt;Rolling walker  OT Visit Diagnosis: Unsteadiness on feet (R26.81);Pain;History of falling (Z91.81);Muscle weakness (generalized) (M62.81) Pain - Right/Left: Right Pain - part of body:  (ribs)   Activity Tolerance Patient limited by fatigue   Patient Left in bed;with call bell/phone within reach;with bed alarm set   Nurse  Communication Mobility status (toileting)        Time: 3428-7681 OT Time Calculation (min): 26 min  Charges: OT General Charges $OT Visit: 1 Procedure OT Treatments $Self Care/Home Management : 23-37 mins    Simonne Come, [phone] 01/25/2017, 9:54 AM

## 2017-01-25 NOTE — Care Management Important Message (Signed)
Important Message  Patient Details  Name: [name] MRN: [id number] Date of Birth: 02-Sep-1918   Medicare Important Message Given:  Yes    [name] 01/25/2017, 2:24 PM

## 2017-01-25 NOTE — Progress Notes (Signed)
qPhysical Therapy Treatment Patient Details Name: [name] MRN: [id number] DOB: 19-Feb-1918 Today's Date: 01/25/2017    History of Present Illness [name] a 83 y.o.femalewith medical history significant of arthritis, chronic pain, hypothyroidism,prior history of breast cancer, recent fall and right rib fractures who presents with AMS.  Last week, pt had a mechanical fall suffering a right sided rib fx with pneumothorax. Now imaging showing R sided PNA.    PT Comments    Patient with family present on arrival, just lost hearing aid and safety poor/impulsive today, but possibly related to looking for hearing aid (later found in patient's hospital gown pocket).  Patient reported decreased pain initially, but by end of session asked nurse for pain medication.  Overall MIN assist/guard for safety due to quick movements and shifting focus.  Overall pain and function are improving, patient will benefit from continuation of skilled PT services.   Follow Up Recommendations  Home health PT;Supervision for mobility/OOB     Equipment Recommendations  None recommended by PT    Recommendations for Other Services       Precautions / Restrictions Precautions Precautions: Fall Precaution Comments: monitor O2, HOH Restrictions Weight Bearing Restrictions: No    Mobility  Bed Mobility               General bed mobility comments: Up in chair on arrival  Transfers Overall transfer level: Needs assistance Equipment used: Rolling walker (2 wheeled) Transfers: Sit to/from Stand Sit to Stand: Min assist;Min guard         General transfer comment: cues for safety with RW with positioning, as pt with quick movements secondary to urgency with need for toileting  Ambulation/Gait Ambulation/Gait assistance: Min assist;Min guard Ambulation Distance (Feet): 80 Feet Assistive device: Rolling walker (2 wheeled) Gait Pattern/deviations: Step-through pattern;Decreased stride  length   Gait velocity interpretation: Below normal speed for age/gender General Gait Details: variable speed and directions, frequent pauses to look around (spouse reports she has rollator at home.)   Stairs            Wheelchair Mobility    Modified Rankin (Stroke Patients Only)       Balance Overall balance assessment: Needs assistance Sitting-balance support: No upper extremity supported;Feet supported Sitting balance-Leahy Scale: Fair     Standing balance support: Bilateral upper extremity supported;During functional activity Standing balance-Leahy Scale: Poor Standing balance comment: RW needed for ambulation.                            Cognition Arousal/Alertness: Awake/alert Behavior During Therapy: Impulsive;WFL for tasks assessed/performed Overall Cognitive Status: Within Functional Limits for tasks assessed (Safety may be related to [facility].)                                 General Comments: Frequent cues to slow/wait for therapist readiness with O2 and IV      Exercises      General Comments General comments (skin integrity, edema, etc.): Saturation maintained with supplemental O2      Pertinent Vitals/Pain Pain Assessment: Faces Faces Pain Scale: Hurts a little bit (Increased with activity to 4) Pain Location: R flank with some movements Pain Descriptors / Indicators: Grimacing;Guarding Pain Intervention(s): Monitored during session;Patient requesting pain meds-RN notified    Home Living  Prior Function            PT Goals (current goals can now be found in the care plan section) Acute Rehab PT Goals Patient Stated Goal: to get my strength back PT Goal Formulation: With patient Time For Goal Achievement: 01/30/17 Potential to Achieve Goals: Good Progress towards PT goals: Progressing toward goals    Frequency    Min 3X/week      PT Plan Current plan remains appropriate     Co-evaluation             End of Session Equipment Utilized During Treatment: Gait belt;Oxygen Activity Tolerance: Patient tolerated treatment well Patient left: in chair;with call bell/phone within reach;with chair alarm set;with family/visitor present Nurse Communication: Mobility status PT Visit Diagnosis: History of falling (Z91.81);Pain;Difficulty in walking, not elsewhere classified (R26.2) Pain - Right/Left: Right Pain - part of body:  (right flank)     Time: 1530-1605 PT Time Calculation (min) (ACUTE ONLY): 35 min  Charges:  $Gait Training: 8-22 mins $Therapeutic Activity: 8-22 mins                    G Codes:       Judith Blonder, DPT   Zenia Resides, Yumiko Alkins L 01/25/2017, 4:26 PM

## 2017-01-25 NOTE — Progress Notes (Signed)
PROGRESS NOTE    [name]  GEX:[id number] DOB: 1918-03-11 DOA: 01/22/2017 PCP: Tivis Ringer, MD  Brief Narrative:[name] is a 83 y.o. female with medical history significant of arthritis, chronic pain, hypothyroidism, prior history of breast cancer, recent fall and right rib fractures who presented with AMS.  Last week, patient had fallen out of her recliner and broke right sided ribs with right pneumothorax. She was discharged home and has been recovering then admitted 4/3 due to lethargy, CXR with pneumonia   Assessment & Plan:     Pneumonia -In the background of recent rib fractures/atelectasis -has productive cough, dyspnea, this morning more wheezing and visibly more dyspneic -will give IV lasix, solumedrol, nebs  -continue ceftriaxone, azithromycin, mucinex etc -ambulate, OOB -discussed code status with pt and spouse, they agree with DNR now -plan for home with home health in [id number]    Metabolic encephalopathy -Likely secondary to pneumonia as well as narcotics -Mentation improved   Recent rib fracture/eighth rib with small hemothorax and pneumothorax -Treated with supportive care, now with right lower lobe infiltrate - incentive spirometry   Mild dementia -Monitor for sundowning  PAC vs PAfib -Heart rate controlled, chads score greater than 3 however, due to advanced age, recent fall, hemothorax would not start anticoagulation  Elevated troponin -non ACS pattern, no chest pain, no plan for cardiac workup in the absence of overt symptoms due to advanced age  Recent fall with right rib fx -Supportive care -Incentive spirometry -PT   Chronic pain -Continue home meds fentanyl patch, oxycodone -discussed with her spouse regarding medications especially since she is on 5 potentially sedating meds at home -recommended to slowly start weaning this upon PCP FU  Depression/Anxiety -Continue home klonopin, lexapro,  trazodone  Hypothyroidism -Continue synthroid  DVT prophylaxis: Lovenox  Code Status:  DNR now Family Communication: spouse at bedside  Disposition Plan: Home in 1-2days if stable  Antimicrobials:  Rocephin/zithromax  Subjective: Coughing, more, quite weak, visibly more dyspneic, very confused yesterday  Objective: Vitals:   01/25/17 0322 01/25/17 0729 01/25/17 0910 01/25/17 1242  BP: (!) 148/71  (!) 146/67 (!) 156/72  Pulse: 68  67 69  Resp: 18   20  Temp: 98.2 F (36.8 C)   98.2 F (36.8 C)  TempSrc: Oral   Oral  SpO2: 96% 94%  92%  Weight: 58.1 kg (128 lb)     Height:        Intake/Output Summary (Last 24 hours) at 01/25/17 1308 Last data filed at 01/25/17 1243  Gross per 24 hour  Intake              290 ml  Output              400 ml  Net             -110 ml   Filed Weights   01/23/17 0621 01/24/17 0606 01/25/17 0322  Weight: 59.4 kg (131 lb) 58.1 kg (128 lb 1.6 oz) 58.1 kg (128 lb)    Examination:  General exam: Appears calm and comfortable, frail, mod distress Respiratory system: ronchi at R base, few wheezes bilaterally  Cardiovascular system: S1 & S2 heard, RRR. No JVD, murmurs, rubs, Gastrointestinal system: Abdomen is nondistended, soft and nontenderNormal bowel sounds heard. Central nervous system: Alert and oriented. No focal neurological deficits. Extremities: Symmetric 5 x 5 power. Skin: No rashes, lesions or ulcers Psychiatry: Judgement and insight appear normal. Mood & affect appropriate.     Data Reviewed:  CBC:  Recent Labs Lab 01/22/17 1319 01/23/17 0524 01/24/17 0343 01/25/17 0415  WBC 8.5 7.1 5.7 4.7  HGB 10.4* 9.5* 9.6* 9.3*  HCT 31.9* 29.6* 30.8* 29.0*  MCV 97.6 96.7 97.2 95.4  PLT 185 204 159 109   Basic Metabolic Panel:  Recent Labs Lab 01/22/17 1319 01/23/17 0524 01/24/17 0343  NA 131* 131* 132*  K 4.9 4.2 4.6  CL 97* 98* 96*  CO2 26 24 27   GLUCOSE 110* 100* 97  BUN 23* 24* 23*  CREATININE 1.32* 1.24*  1.20*  CALCIUM 8.5* 8.1* 8.3*   GFR: Estimated Creatinine Clearance: 22.5 mL/min (A) (by C-G formula based on SCr of 1.2 mg/dL (H)). Liver Function Tests: No results for input(s): AST, ALT, ALKPHOS, BILITOT, PROT, ALBUMIN in the last 168 hours. No results for input(s): LIPASE, AMYLASE in the last 168 hours. No results for input(s): AMMONIA in the last 168 hours. Coagulation Profile: No results for input(s): INR, PROTIME in the last 168 hours. Cardiac Enzymes:  Recent Labs Lab 01/22/17 1319 01/22/17 1823 01/22/17 2337 01/23/17 0524  TROPONINI 0.12* 0.14* 0.15* 0.11*   BNP (last 3 results) No results for input(s): PROBNP in the last 8760 hours. HbA1C: No results for input(s): HGBA1C in the last 72 hours. CBG:  Recent Labs Lab 01/22/17 1228  GLUCAP 104*   Lipid Profile: No results for input(s): CHOL, HDL, LDLCALC, TRIG, CHOLHDL, LDLDIRECT in the last 72 hours. Thyroid Function Tests: No results for input(s): TSH, T4TOTAL, FREET4, T3FREE, THYROIDAB in the last 72 hours. Anemia Panel: No results for input(s): VITAMINB12, FOLATE, FERRITIN, TIBC, IRON, RETICCTPCT in the last 72 hours. Urine analysis:    Component Value Date/Time   COLORURINE YELLOW 01/13/2017 1029   APPEARANCEUR CLEAR 01/13/2017 1029   LABSPEC 1.009 01/13/2017 1029   PHURINE 7.0 01/13/2017 1029   GLUCOSEU NEGATIVE 01/13/2017 1029   HGBUR SMALL (A) 01/13/2017 1029   BILIRUBINUR NEGATIVE 01/13/2017 1029   KETONESUR NEGATIVE 01/13/2017 1029   PROTEINUR NEGATIVE 01/13/2017 1029   NITRITE NEGATIVE 01/13/2017 1029   LEUKOCYTESUR NEGATIVE 01/13/2017 1029   Sepsis Labs: @LABRCNTIP (procalcitonin:4,lacticidven:4)  )No results found for this or any previous visit (from the past 240 hour(s)).       Radiology Studies: Dg Chest 2 View  Result Date: 01/24/2017 CLINICAL DATA:  Shortness of breath today occasional midline chest pain EXAM: CHEST  2 VIEW COMPARISON:  Chest x-ray of 01/22/2017 FINDINGS: There is  opacity on the lateral view posteriorly which could represent fluid or atelectasis. No definite pneumonia is seen on the frontal view. Cardiomegaly is stable. The bones are osteopenic. Left humeral head prosthesis is noted. IMPRESSION: Opacity posterior on the lateral view may represent effusion and/or atelectasis. No definite pneumonia is seen on the frontal chest x-ray. Electronically Signed   By: Ivar Drape M.D.   On: 01/24/2017 17:02        Scheduled Meds: . azithromycin  500 mg Oral Q24H  . cefTRIAXone (ROCEPHIN)  IV  1 g Intravenous Q24H  . donepezil  2.5 mg Oral QPM  . enoxaparin (LOVENOX) injection  30 mg Subcutaneous Q24H  . escitalopram  10 mg Oral Daily  . fentaNYL  50 mcg Transdermal Q72H  . folic acid  1 mg Oral Daily  . guaiFENesin  600 mg Oral BID  . ipratropium-albuterol  3 mL Nebulization TID  . levothyroxine  88 mcg Oral QAC breakfast  . mouth rinse  15 mL Mouth Rinse BID  . methylPREDNISolone (SOLU-MEDROL) injection  40 mg  Intravenous Q12H  . mirabegron ER  25 mg Oral Daily  . mometasone-formoterol  2 puff Inhalation BID  .  morphine injection  4 mg Intravenous Once  . polyethylene glycol  17 g Oral Daily  . traZODone  50 mg Oral QHS   Continuous Infusions:    LOS: 3 days    Time spent: 49min    Domenic Polite, MD [facility] Pager [phone]  If 7PM-7AM, please contact night-coverage [email or web address] Password [email or web address] 01/25/2017, 1:08 PM

## 2017-01-26 LAB — BASIC METABOLIC PANEL
ANION GAP: 12 (ref 5–15)
BUN: 23 mg/dL — ABNORMAL HIGH (ref 6–20)
CALCIUM: 8.7 mg/dL — AB (ref 8.9–10.3)
CHLORIDE: 95 mmol/L — AB (ref 101–111)
CO2: 23 mmol/L (ref 22–32)
Creatinine, Ser: 1.17 mg/dL — ABNORMAL HIGH (ref 0.44–1.00)
GFR calc Af Amer: 44 mL/min — ABNORMAL LOW (ref >60)
GFR calc non Af Amer: 38 mL/min — ABNORMAL LOW (ref >60)
GLUCOSE: 186 mg/dL — AB (ref 65–99)
POTASSIUM: 4 mmol/L (ref 3.5–5.1)
Sodium: 130 mmol/L — ABNORMAL LOW (ref 135–145)

## 2017-01-26 MED ORDER — POLYETHYLENE GLYCOL 3350 17 G PO PACK: 17.0000 g | PACK | Freq: Every day | ORAL | 0 refills | Status: AC | PRN

## 2017-01-26 MED ORDER — PREDNISONE 20 MG PO TABS
40.0000 mg | ORAL_TABLET | Freq: Every day | ORAL | Status: DC
  Administered 2017-01-26: 40 mg via ORAL
  Filled 2017-01-26: qty 2

## 2017-01-26 MED ORDER — PREDNISONE 20 MG PO TABS: 40.0000 mg | ORAL_TABLET | Freq: Every day | ORAL | 0 refills | Status: AC

## 2017-01-26 MED ORDER — ALBUTEROL SULFATE HFA 108 (90 BASE) MCG/ACT IN AERS: 1.0000 | INHALATION_SPRAY | RESPIRATORY_TRACT | 0 refills | Status: AC | PRN

## 2017-01-26 MED ORDER — CEFPODOXIME PROXETIL 200 MG PO TABS: 200.0000 mg | ORAL_TABLET | Freq: Two times a day (BID) | ORAL | 0 refills | Status: AC

## 2017-01-26 NOTE — Progress Notes (Signed)
At 1225 gave d/c instructions to pt and her spouse at bedside.  Verbalized understanding. D/c to awaiting transport. Karie Kirks, Therapist, sports.

## 2017-01-26 NOTE — Progress Notes (Signed)
CM notified [facility] of discharge for [facility] of HHRN/PT/OT/aide.No other Cm needs were communicated.

## 2017-01-26 NOTE — Progress Notes (Signed)
qPhysical Therapy Treatment Patient Details Name: [name] MRN: [id number] DOB: [birth year] Today's Date: 01/26/2017    History of Present Illness [name] a 83 y.o.femalewith medical history significant of arthritis, chronic pain, hypothyroidism,prior history of breast cancer, recent fall and right rib fractures who presents with AMS.  Last week, pt had a mechanical fall suffering a right sided rib fx with pneumothorax. Now imaging showing R sided PNA.    PT Comments    Pt making progress with mobility. She continues to require min guard to min A for safety with ambulation using RW. Pt ambulated on RA with SPO2 at 89-92% throughout. Pt and pt's husband both expressing desire to return home today and pt's husband states that he is able to assist pt if needed. Pt would continue to benefit from skilled physical therapy services at this time while admitted and after d/c to address the below listed limitations in order to improve overall safety and independence with functional mobility.   Follow Up Recommendations  Home health PT;Supervision for mobility/OOB     Equipment Recommendations  None recommended by PT    Recommendations for Other Services       Precautions / Restrictions Precautions Precautions: Fall Precaution Comments: monitor O2, HOH Restrictions Weight Bearing Restrictions: No    Mobility  Bed Mobility Overal bed mobility: Needs Assistance Bed Mobility: Supine to Sit;Sit to Supine     Supine to sit: Min guard Sit to supine: Min guard   General bed mobility comments: increased time, min guard for safety  Transfers Overall transfer level: Needs assistance Equipment used: Rolling walker (2 wheeled) Transfers: Sit to/from Stand Sit to Stand: Min guard         General transfer comment: increased time, good technique, min guard for safety  Ambulation/Gait Ambulation/Gait assistance: Min assist;Min guard Ambulation Distance (Feet): 50  Feet Assistive device: Rolling walker (2 wheeled) Gait Pattern/deviations: Step-through pattern;Decreased stride length Gait velocity: decreased   General Gait Details: pt required cues to maintain a safe distance from RW, occasional min A for stability but no LOB   Stairs            Wheelchair Mobility    Modified Rankin (Stroke Patients Only)       Balance Overall balance assessment: Needs assistance Sitting-balance support: No upper extremity supported;Feet supported Sitting balance-Leahy Scale: Fair     Standing balance support: Bilateral upper extremity supported;During functional activity Standing balance-Leahy Scale: Poor Standing balance comment: pt reliant on bilateral UEs on RW                            Cognition Arousal/Alertness: Awake/alert Behavior During Therapy: WFL for tasks assessed/performed Overall Cognitive Status: Within Functional Limits for tasks assessed                                        Exercises      General Comments        Pertinent Vitals/Pain Pain Assessment: No/denies pain    Home Living                      Prior Function            PT Goals (current goals can now be found in the care plan section) Acute Rehab PT Goals PT Goal Formulation: With patient Time For  Goal Achievement: 01/30/17 Potential to Achieve Goals: Good Progress towards PT goals: Progressing toward goals    Frequency    Min 3X/week      PT Plan Current plan remains appropriate    Co-evaluation             End of Session Equipment Utilized During Treatment: Gait belt Activity Tolerance: Patient tolerated treatment well Patient left: in bed;with call bell/phone within reach;with bed alarm set;with family/visitor present Nurse Communication: Mobility status;Other (comment) (pt's IV dislodged) PT Visit Diagnosis: History of falling (Z91.81);Difficulty in walking, not elsewhere classified (R26.2)      Time: 5947-0761 PT Time Calculation (min) (ACUTE ONLY): 16 min  Charges:  $Gait Training: 8-22 mins                    G Codes:       [place], [place], [place] 01/26/2017, 10:01 AM

## 2017-01-26 NOTE — Discharge Summary (Signed)
Physician Discharge Summary  [name] QQV:[id number] DOB: 04-Mar-1918 DOA: 01/22/2017  PCP: Tivis Ringer, MD  Admit date: 01/22/2017 Discharge date: 01/26/2017  Time spent: 35 minutes  Recommendations for Outpatient Follow-up:  1. PCP Dr.Avva in 5days, please consider setting up Hospice services at home, I have initiated this discussion with spouse   Discharge Diagnoses:   Community acquired pneumonia  Recent Rib fractures  Chronic back pain  Long term narcotic dependence   Community acquired pneumonia of right lung   Breast cancer of upper-outer quadrant of right female breast ([place])   Hemothorax   Anxiety  Discharge Condition: stable  Diet recommendation: regular  Filed Weights   01/24/17 0606 01/25/17 0322 01/26/17 0517  Weight: 58.1 kg (128 lb 1.6 oz) 58.1 kg (128 lb) 56.3 kg (124 lb 1.6 oz)    History of present illness:  [name] a 83 y.o.femalewith medical history significant of arthritis, chronic pain, hypothyroidism,prior history of breast cancer, recent fall and right rib fractures who presented with AMS.  Last week, patient had fallen out of her recliner and broke right sided ribs with right pneumothorax. She was discharged home and has been recovering then admitted 4/3 due to lethargy, CXR with pneumonia  Hospital Course:    Pneumonia -In the background of recent rib fractures/atelectasis -had productive cough, dyspnea, mild wheezing through this admission -much improved with Abx, steroids, nebs -Ambulated with PT and Home health recommended, we have set up Home health PT/OT/RN/Aide - I have discussed with her spouse that she is very frail and very quick readmission now, and told him that she may be nearing the end of life if she suffers another event down the road at age 83, they agreed to DNR, I also recommended ongoing discussions on Hospice care at home with PCP at the time of follow up -discharged home on albuterol inhaler, Abx and prednisone  taper    Metabolic encephalopathy -Secondary to pneumonia as well as narcotics, spouse was reluctant to titrate down her chronic long terms medications since shes been on them for about 20years, will defer this to Dr.Avva -Mentation improved and back to baseline with Rx for pneumonia   Recent hospitalization with rib fracture/eighth rib with small hemothorax and pneumothorax -Treated with supportive care, now with right lower lobe infiltrate - no fevers and leukocytosis and non toxic so I do not clinically think the small hemothorax could be infected   Mild dementia -stable  PAC vs PAfib -Heart rate controlled, chads score greater than 3 however, due to advanced age, recent fall, hemothorax would not start anticoagulation  Elevated troponin -non ACS pattern, no chest pain, no plan for cardiac workup in the absence of overt symptoms due to advanced age  Recent fall with right rib fx -Supportive care -Incentive spirometry -Home PT set up  Discharge Exam: Vitals:   01/26/17 0517 01/26/17 0518  BP: (!) 171/71 (!) 177/87  Pulse: 94   Resp: 18   Temp: 97.5 F (36.4 C)     General: AAOx 2 Cardiovascular: S1S2/RRR Respiratory: few ronchi at R base  Discharge Instructions   Discharge Instructions    Diet - low sodium heart healthy    Complete by:  As directed    Increase activity slowly    Complete by:  As directed      Current Discharge Medication List    START taking these medications   Details  albuterol (PROVENTIL HFA;VENTOLIN HFA) 108 (90 Base) MCG/ACT inhaler Inhale 1-2 puffs into the  lungs every 4 (four) hours as needed for wheezing or shortness of breath. Qty: 1 Inhaler, Refills: 0    cefpodoxime (VANTIN) 200 MG tablet Take 1 tablet (200 mg total) by mouth 2 (two) times daily. For 4days Qty: 8 tablet, Refills: 0    polyethylene glycol (MIRALAX / GLYCOLAX) packet Take 17 g by mouth daily as needed for mild constipation. Qty: 6 each, Refills: 0     predniSONE (DELTASONE) 20 MG tablet Take 2 tablets (40 mg total) by mouth daily with breakfast. For 1day then 20mg  for 2days then STOP Qty: 6 tablet, Refills: 0      CONTINUE these medications which have NOT CHANGED   Details  BENICAR 20 MG tablet Take 10 mg by mouth daily.    Associated Diagnoses: Breast cancer ([place])    cholecalciferol (VITAMIN D) 1000 UNITS tablet Take 2,000 Units by mouth daily.     clonazepam (KLONOPIN) 0.125 MG disintegrating tablet Take 0.125 mg by mouth 2 (two) times daily as needed (for anxiety).    donepezil (ARICEPT) 5 MG tablet Take 2.5 mg by mouth every evening. With food    escitalopram (LEXAPRO) 10 MG tablet Take 10 mg by mouth daily.  Refills: 11    fentaNYL (DURAGESIC - DOSED MCG/HR) 50 MCG/HR Place 1 patch onto the skin every other day.     Fluticasone-Salmeterol (ADVAIR DISKUS) 250-50 MCG/DOSE AEPB Inhale 2 puffs into the lungs daily as needed (for flares).     folic acid (FOLVITE) 1 MG tablet Take 1 mg by mouth daily. Refills: 11    levothyroxine (SYNTHROID, LEVOTHROID) 88 MCG tablet Take 88 mcg by mouth daily before breakfast.  Refills: 2    methotrexate (RHEUMATREX) 2.5 MG tablet Take 12.5 mg by mouth every 7 (seven) days.     MYRBETRIQ 25 MG TB24 tablet Take 12.5 mg by mouth daily.     oxyCODONE (OXY IR/ROXICODONE) 5 MG immediate release tablet Take 5 mg by mouth 2 (two) times daily as needed for pain.    traZODone (DESYREL) 50 MG tablet Take 50 mg by mouth at bedtime.  Refills: 3       Allergies  Allergen Reactions  . Diclofenac Sodium     Reaction unknown to patient, but has been in her record for 30+ years  . Procaine Hcl     Reaction unknown to patient, but has been in her record for 30+ years  . Sulfa Antibiotics     Reaction unknown to patient, but has been in her record for 30+ years  . Vicodin [Hydrocodone-Acetaminophen]     Reaction unknown to patient, but has been in her record for 30+ years  . Chlorpheniramine      Allergic to Coricidin   . Dextromethorphan     Allergic to Coricidin   . Novocain [Procaine]     Reaction unknown to patient, but has been in her record for 30+ years  . Percodan [Oxycodone-Aspirin]     Reaction unknown to patient, but has been in her record for 30+ years  . Voltaren [Diclofenac]     Reaction unknown to patient, but has been in her record for 30+ years   Follow-up Information    [facility] Follow up.   Specialty:  Home Health Services Why:  They will continue to do your home health care at your home Contact information: [address] [place] [address] [phone]        Tivis Ringer, MD. Schedule an appointment  as soon as possible for a visit in 1 week(s).   Specialty:  Internal Medicine Why:  Need Discussion on Hospice care at home at the time of Follow up Contact information: [place] [id number] [phone]            The results of significant diagnostics from this hospitalization (including imaging, microbiology, ancillary and laboratory) are listed below for reference.    Significant Diagnostic Studies: Dg Chest 2 View  Result Date: 01/24/2017 CLINICAL DATA:  Shortness of breath today occasional midline chest pain EXAM: CHEST  2 VIEW COMPARISON:  Chest x-ray of 01/22/2017 FINDINGS: There is opacity on the lateral view posteriorly which could represent fluid or atelectasis. No definite pneumonia is seen on the frontal view. Cardiomegaly is stable. The bones are osteopenic. Left humeral head prosthesis is noted. IMPRESSION: Opacity posterior on the lateral view may represent effusion and/or atelectasis. No definite pneumonia is seen on the frontal chest x-ray. Electronically Signed   By: Ivar Drape M.D.   On: 01/24/2017 17:02   Dg Chest 2 View  Result Date: 01/22/2017 CLINICAL DATA:  Cough. EXAM: CHEST  2 VIEW COMPARISON:  01/14/2017 01/13/2017.  10/31/2010 . FINDINGS: Mediastinum and hilar structures normal.  Cardiomegaly with normal pulmonary vascularity. Low lung volumes. Mild right base infiltrate and right pleural effusion. No pleural effusion pneumothorax. Stable severe arthritic changes noted about the right shoulder. Total left shoulder replacement. Cervicothoracic spine fusion. IMPRESSION: 1. Right lower lobe infiltrate with small right pleural effusion. 2.  Left mid lung field pleuroparenchymal scarring. 3.  Stable cardiomegaly.  No pulmonary venous congestion . Electronically Signed   By: Marcello Moores  Register   On: 01/22/2017 11:56   Dg Chest 2 View  Result Date: 01/13/2017 CLINICAL DATA:  Unwitnessed fall.  Found on floor.  Dementia. EXAM: CHEST  2 VIEW COMPARISON:  Chest radiograph 10/31/2010. FINDINGS: Cardiomediastinal silhouette is increased. No active infiltrates or failure. Prior BILATERAL shoulder surgery. No visible acute rib fracture or pneumothorax. Degenerative change in the thoracic spine without definite acute compression deformity. IMPRESSION: Cardiomegaly. No definite active infiltrates or failure. No visible acute compression fracture. Electronically Signed   By: Staci Righter M.D.   On: 01/13/2017 09:39   Ct Head Wo Contrast  Result Date: 01/13/2017 CLINICAL DATA:  Rib pain after a fall. EXAM: CT HEAD WITHOUT CONTRAST CT CERVICAL SPINE WITHOUT CONTRAST TECHNIQUE: Multidetector CT imaging of the head and cervical spine was performed following the standard protocol without intravenous contrast. Multiplanar CT image reconstructions of the cervical spine were also generated. COMPARISON:  CT neck 02/01/2016. FINDINGS: CT HEAD FINDINGS Brain: No evidence for acute infarction, hemorrhage, mass lesion, hydrocephalus, or extra-axial fluid. Global atrophy. Hypoattenuation of white matter suggesting small vessel disease. Old LEFT occipital infarct. Vascular: No intracranial hyperdense vessel to suggest acute thrombosis. BILATERAL cavernous carotid and distal vertebral calcification, not unexpected  for age. Skull: No skull fracture.  No worrisome osseous lesion. Sinuses/Orbits: BILATERAL cataract extraction. No layering sinus fluid. Other: None. CT CERVICAL SPINE FINDINGS Alignment: Degenerative anterolisthesis C3-C4. Chronic anterolisthesis of the odontoid on body of CTA related to nonunion. Previous posterior fusion. Skull base and vertebrae: There is a chronic ununited type 2 odontoid fracture similar to 2017. Chronic C1 arch fractures are also stable. Posterior cerclage wire at C1-C2 appears similar. There has been previous C5-C7 ACDF with anterior plating without adverse features. No acute fracture is seen. Soft tissues and spinal canal: No prevertebral soft tissue swelling. No intraspinal hematoma. Mild central canal  stenosis may be present C5-C6. Atherosclerosis. Nuchal ligament calcification. Disc levels:  Multilevel spondylosis. Upper chest: Biapical scarring. Tiny RIGHT pneumothorax. No rib fracture can be seen. Other: None. Compared with 2017, the C1 and odontoid nonunions have a similar appearance. No pneumothorax was present in 2017. IMPRESSION: No skull fracture or intracranial hemorrhage. 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 Posterior cerclage wire fusion appears solid. Tiny RIGHT apical pneumothorax. Biapical scarring appears similar. If further investigation desired, recommend CT chest without contrast. Critical Value/emergent results were called by telephone at the time of interpretation on 01/13/2017 at 9:58 am to Dr. Marda Stalker , who verbally acknowledged these results. Electronically Signed   By: Staci Righter M.D.   On: 01/13/2017 10:05   Ct Chest W Contrast  Result Date: 01/13/2017 CLINICAL DATA:  Unwitnessed fall.  RIGHT-sided chest pain. EXAM: CT CHEST WITH CONTRAST TECHNIQUE: Multidetector CT imaging of the chest was performed during intravenous contrast administration. CONTRAST:  1 ISOVUE-300 IOPAMIDOL (ISOVUE-300) INJECTION  61% COMPARISON:  Chest radiograph earlier today. CT cervical spine earlier today. CT abdomen and pelvis 06/03/2013. FINDINGS: Cardiovascular: No significant vascular findings. Cardiomegaly. Mild coronary artery calcification. Mediastinum/Nodes: No hilar adenopathy. Pretracheal nodal enlargement up to 12 mm, stable from priors. No paratracheal or AP window adenopathy. Lungs/Pleura: RIGHT pleural effusion/hemothorax. Small RIGHT apical pneumothorax noted on CT cervical spine has resolved. There are tiny bubbles of air in the RIGHT effusion and in the RIGHT posterior soft tissues. Biapical pleural thickening. BILATERAL pleural plaques similar to prior chest CT, likely post XRT findings. Upper Abdomen: Post cholecystectomy appearance with biliary ductal dilatation similar to prior CT. Musculoskeletal: Displaced posterior RIGHT eighth rib fracture, reference image 93 series 5. Nondisplaced posterolateral RIGHT seventh rib fracture. Tiny nondisplaced RIGHT transverse process fracture at T8. Surgically replaced LEFT humeral head. Severe degenerative change RIGHT shoulder. IMPRESSION: There is a small RIGHT hemothorax related to displaced RIGHT eighth rib fracture. This rib fracture cannot clearly be identified on prior chest radiograph. No significant pneumothorax. A small apical pneumothorax noted on CT cervical spine and resolved. A few bubbles of air can be seen in the RIGHT pleural fluid as well as in the soft tissues of the back. Nondisplaced RIGHT seventh rib fracture and nondisplaced RIGHT T8 transverse process fracture. Electronically Signed   By: Staci Righter M.D.   On: 01/13/2017 11:47   Ct Cervical Spine Wo Contrast  Result Date: 01/13/2017 CLINICAL DATA:  Rib pain after a fall. EXAM: CT HEAD WITHOUT CONTRAST CT CERVICAL SPINE WITHOUT CONTRAST TECHNIQUE: Multidetector CT imaging of the head and cervical spine was performed following the standard protocol without intravenous contrast. Multiplanar CT image  reconstructions of the cervical spine were also generated. COMPARISON:  CT neck 02/01/2016. FINDINGS: CT HEAD FINDINGS Brain: No evidence for acute infarction, hemorrhage, mass lesion, hydrocephalus, or extra-axial fluid. Global atrophy. Hypoattenuation of white matter suggesting small vessel disease. Old LEFT occipital infarct. Vascular: No intracranial hyperdense vessel to suggest acute thrombosis. BILATERAL cavernous carotid and distal vertebral calcification, not unexpected for age. Skull: No skull fracture.  No worrisome osseous lesion. Sinuses/Orbits: BILATERAL cataract extraction. No layering sinus fluid. Other: None. CT CERVICAL SPINE FINDINGS Alignment: Degenerative anterolisthesis C3-C4. Chronic anterolisthesis of the odontoid on body of CTA related to nonunion. Previous posterior fusion. Skull base and vertebrae: There is a chronic ununited type 2 odontoid fracture similar to 2017. Chronic C1 arch fractures are also stable. Posterior cerclage wire at C1-C2 appears similar. There has been previous C5-C7  ACDF with anterior plating without adverse features. No acute fracture is seen. Soft tissues and spinal canal: No prevertebral soft tissue swelling. No intraspinal hematoma. Mild central canal stenosis may be present C5-C6. Atherosclerosis. Nuchal ligament calcification. Disc levels:  Multilevel spondylosis. Upper chest: Biapical scarring. Tiny RIGHT pneumothorax. No rib fracture can be seen. Other: None. Compared with 2017, the C1 and odontoid nonunions have a similar appearance. No pneumothorax was present in 2017. IMPRESSION: No skull fracture or intracranial hemorrhage. 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 Posterior cerclage wire fusion appears solid. Tiny RIGHT apical pneumothorax. Biapical scarring appears similar. If further investigation desired, recommend CT chest without contrast. Critical Value/emergent results were called by telephone at the time of  interpretation on 01/13/2017 at 9:58 am to Dr. Marda Stalker , who verbally acknowledged these results. Electronically Signed   By: Staci Righter M.D.   On: 01/13/2017 10:05   Dg Chest Port 1 View  Result Date: 01/14/2017 CLINICAL DATA:  Right rib fractures.  Chest pain. EXAM: PORTABLE CHEST 1 VIEW COMPARISON:  Chest radiograph from one day prior. FINDINGS: Partially visualized surgical hardware from ACDF overlies the lower cervical spine. Left total shoulder arthroplasty. Stable cardiomediastinal silhouette with top-normal heart size and aortic atherosclerosis. No pneumothorax. No pleural effusion. No pulmonary edema. Stable mild left basilar atelectasis. No acute consolidative airspace disease. IMPRESSION: 1. No pneumothorax. 2. No pleural effusion. 3. Stable mild left basilar atelectasis . 4. Aortic atherosclerosis. Electronically Signed   By: Ilona Sorrel M.D.   On: 01/14/2017 07:26   Dg Hand Complete Right  Result Date: 01/13/2017 CLINICAL DATA:  RIGHT hand pain diffusely.  Unwitnessed fall. EXAM: RIGHT HAND - COMPLETE 3+ VIEW COMPARISON:  None. FINDINGS: Severe degenerative change at the radiocarpal, intercarpal, carpometacarpal, and metacarpophalangeal joints. Osteopenia. No definite fracture. No dislocation. No significant soft tissue swelling or foreign body. IMPRESSION: No definite acute findings.  Advanced degenerative change. Electronically Signed   By: Staci Righter M.D.   On: 01/13/2017 09:40    Microbiology: No results found for this or any previous visit (from the past 240 hour(s)).   Labs: Basic Metabolic Panel:  Recent Labs Lab 01/22/17 1319 01/23/17 0524 01/24/17 0343 01/26/17 0925  NA 131* 131* 132* 130*  K 4.9 4.2 4.6 4.0  CL 97* 98* 96* 95*  CO2 26 24 27 23   GLUCOSE 110* 100* 97 186*  BUN 23* 24* 23* 23*  CREATININE 1.32* 1.24* 1.20* 1.17*  CALCIUM 8.5* 8.1* 8.3* 8.7*   Liver Function Tests: No results for input(s): AST, ALT, ALKPHOS, BILITOT, PROT, ALBUMIN  in the last 168 hours. No results for input(s): LIPASE, AMYLASE in the last 168 hours. No results for input(s): AMMONIA in the last 168 hours. CBC:  Recent Labs Lab 01/22/17 1319 01/23/17 0524 01/24/17 0343 01/25/17 0415  WBC 8.5 7.1 5.7 4.7  HGB 10.4* 9.5* 9.6* 9.3*  HCT 31.9* 29.6* 30.8* 29.0*  MCV 97.6 96.7 97.2 95.4  PLT 185 204 159 159   Cardiac Enzymes:  Recent Labs Lab 01/22/17 1319 01/22/17 1823 01/22/17 2337 01/23/17 0524  TROPONINI 0.12* 0.14* 0.15* 0.11*   BNP: BNP (last 3 results)  Recent Labs  01/13/17 1000  BNP 112.7*    ProBNP (last 3 results) No results for input(s): PROBNP in the last 8760 hours.  CBG:  Recent Labs Lab 01/22/17 1228  GLUCAP 104*       SignedDomenic Polite MD.  [facility] 01/26/2017, 10:51 AM

## 2017-01-29 DIAGNOSIS — M059 Rheumatoid arthritis with rheumatoid factor, unspecified: Secondary | ICD-10-CM | POA: Diagnosis not present

## 2017-01-29 DIAGNOSIS — J189 Pneumonia, unspecified organism: Secondary | ICD-10-CM | POA: Diagnosis not present

## 2017-01-29 DIAGNOSIS — N3281 Overactive bladder: Secondary | ICD-10-CM | POA: Diagnosis not present

## 2017-01-29 DIAGNOSIS — E039 Hypothyroidism, unspecified: Secondary | ICD-10-CM | POA: Diagnosis not present

## 2017-01-29 DIAGNOSIS — F339 Major depressive disorder, recurrent, unspecified: Secondary | ICD-10-CM | POA: Diagnosis not present

## 2017-01-29 DIAGNOSIS — I5030 Unspecified diastolic (congestive) heart failure: Secondary | ICD-10-CM | POA: Diagnosis not present

## 2017-01-29 DIAGNOSIS — K219 Gastro-esophageal reflux disease without esophagitis: Secondary | ICD-10-CM | POA: Diagnosis not present

## 2017-01-29 DIAGNOSIS — I10 Essential (primary) hypertension: Secondary | ICD-10-CM | POA: Diagnosis not present

## 2017-01-29 DIAGNOSIS — J939 Pneumothorax, unspecified: Secondary | ICD-10-CM | POA: Diagnosis not present

## 2017-01-30 DIAGNOSIS — I5030 Unspecified diastolic (congestive) heart failure: Secondary | ICD-10-CM | POA: Diagnosis not present

## 2017-01-30 DIAGNOSIS — F339 Major depressive disorder, recurrent, unspecified: Secondary | ICD-10-CM | POA: Diagnosis not present

## 2017-01-30 DIAGNOSIS — J939 Pneumothorax, unspecified: Secondary | ICD-10-CM | POA: Diagnosis not present

## 2017-01-30 DIAGNOSIS — I10 Essential (primary) hypertension: Secondary | ICD-10-CM | POA: Diagnosis not present

## 2017-01-30 DIAGNOSIS — M059 Rheumatoid arthritis with rheumatoid factor, unspecified: Secondary | ICD-10-CM | POA: Diagnosis not present

## 2017-01-30 DIAGNOSIS — J189 Pneumonia, unspecified organism: Secondary | ICD-10-CM | POA: Diagnosis not present

## 2017-01-31 DIAGNOSIS — I10 Essential (primary) hypertension: Secondary | ICD-10-CM | POA: Diagnosis not present

## 2017-01-31 DIAGNOSIS — J939 Pneumothorax, unspecified: Secondary | ICD-10-CM | POA: Diagnosis not present

## 2017-01-31 DIAGNOSIS — I5030 Unspecified diastolic (congestive) heart failure: Secondary | ICD-10-CM | POA: Diagnosis not present

## 2017-01-31 DIAGNOSIS — M059 Rheumatoid arthritis with rheumatoid factor, unspecified: Secondary | ICD-10-CM | POA: Diagnosis not present

## 2017-01-31 DIAGNOSIS — F339 Major depressive disorder, recurrent, unspecified: Secondary | ICD-10-CM | POA: Diagnosis not present

## 2017-01-31 DIAGNOSIS — J189 Pneumonia, unspecified organism: Secondary | ICD-10-CM | POA: Diagnosis not present

## 2017-02-01 DIAGNOSIS — M059 Rheumatoid arthritis with rheumatoid factor, unspecified: Secondary | ICD-10-CM | POA: Diagnosis not present

## 2017-02-01 DIAGNOSIS — F339 Major depressive disorder, recurrent, unspecified: Secondary | ICD-10-CM | POA: Diagnosis not present

## 2017-02-01 DIAGNOSIS — J939 Pneumothorax, unspecified: Secondary | ICD-10-CM | POA: Diagnosis not present

## 2017-02-01 DIAGNOSIS — I5030 Unspecified diastolic (congestive) heart failure: Secondary | ICD-10-CM | POA: Diagnosis not present

## 2017-02-01 DIAGNOSIS — J189 Pneumonia, unspecified organism: Secondary | ICD-10-CM | POA: Diagnosis not present

## 2017-02-01 DIAGNOSIS — I10 Essential (primary) hypertension: Secondary | ICD-10-CM | POA: Diagnosis not present

## 2017-02-05 DIAGNOSIS — I10 Essential (primary) hypertension: Secondary | ICD-10-CM | POA: Diagnosis not present

## 2017-02-05 DIAGNOSIS — J189 Pneumonia, unspecified organism: Secondary | ICD-10-CM | POA: Diagnosis not present

## 2017-02-05 DIAGNOSIS — J939 Pneumothorax, unspecified: Secondary | ICD-10-CM | POA: Diagnosis not present

## 2017-02-05 DIAGNOSIS — I5030 Unspecified diastolic (congestive) heart failure: Secondary | ICD-10-CM | POA: Diagnosis not present

## 2017-02-05 DIAGNOSIS — F339 Major depressive disorder, recurrent, unspecified: Secondary | ICD-10-CM | POA: Diagnosis not present

## 2017-02-05 DIAGNOSIS — M059 Rheumatoid arthritis with rheumatoid factor, unspecified: Secondary | ICD-10-CM | POA: Diagnosis not present

## 2017-02-08 DIAGNOSIS — J939 Pneumothorax, unspecified: Secondary | ICD-10-CM | POA: Diagnosis not present

## 2017-02-08 DIAGNOSIS — I5030 Unspecified diastolic (congestive) heart failure: Secondary | ICD-10-CM | POA: Diagnosis not present

## 2017-02-08 DIAGNOSIS — M059 Rheumatoid arthritis with rheumatoid factor, unspecified: Secondary | ICD-10-CM | POA: Diagnosis not present

## 2017-02-08 DIAGNOSIS — J189 Pneumonia, unspecified organism: Secondary | ICD-10-CM | POA: Diagnosis not present

## 2017-02-08 DIAGNOSIS — I10 Essential (primary) hypertension: Secondary | ICD-10-CM | POA: Diagnosis not present

## 2017-02-08 DIAGNOSIS — F339 Major depressive disorder, recurrent, unspecified: Secondary | ICD-10-CM | POA: Diagnosis not present

## 2017-02-12 DIAGNOSIS — F339 Major depressive disorder, recurrent, unspecified: Secondary | ICD-10-CM | POA: Diagnosis not present

## 2017-02-12 DIAGNOSIS — J939 Pneumothorax, unspecified: Secondary | ICD-10-CM | POA: Diagnosis not present

## 2017-02-12 DIAGNOSIS — M059 Rheumatoid arthritis with rheumatoid factor, unspecified: Secondary | ICD-10-CM | POA: Diagnosis not present

## 2017-02-12 DIAGNOSIS — J189 Pneumonia, unspecified organism: Secondary | ICD-10-CM | POA: Diagnosis not present

## 2017-02-12 DIAGNOSIS — I5030 Unspecified diastolic (congestive) heart failure: Secondary | ICD-10-CM | POA: Diagnosis not present

## 2017-02-12 DIAGNOSIS — I10 Essential (primary) hypertension: Secondary | ICD-10-CM | POA: Diagnosis not present

## 2017-02-13 DIAGNOSIS — I5030 Unspecified diastolic (congestive) heart failure: Secondary | ICD-10-CM | POA: Diagnosis not present

## 2017-02-13 DIAGNOSIS — F339 Major depressive disorder, recurrent, unspecified: Secondary | ICD-10-CM | POA: Diagnosis not present

## 2017-02-13 DIAGNOSIS — I10 Essential (primary) hypertension: Secondary | ICD-10-CM | POA: Diagnosis not present

## 2017-02-13 DIAGNOSIS — J189 Pneumonia, unspecified organism: Secondary | ICD-10-CM | POA: Diagnosis not present

## 2017-02-13 DIAGNOSIS — J939 Pneumothorax, unspecified: Secondary | ICD-10-CM | POA: Diagnosis not present

## 2017-02-13 DIAGNOSIS — M059 Rheumatoid arthritis with rheumatoid factor, unspecified: Secondary | ICD-10-CM | POA: Diagnosis not present

## 2017-02-15 DIAGNOSIS — F339 Major depressive disorder, recurrent, unspecified: Secondary | ICD-10-CM | POA: Diagnosis not present

## 2017-02-15 DIAGNOSIS — I5030 Unspecified diastolic (congestive) heart failure: Secondary | ICD-10-CM | POA: Diagnosis not present

## 2017-02-15 DIAGNOSIS — J939 Pneumothorax, unspecified: Secondary | ICD-10-CM | POA: Diagnosis not present

## 2017-02-15 DIAGNOSIS — M059 Rheumatoid arthritis with rheumatoid factor, unspecified: Secondary | ICD-10-CM | POA: Diagnosis not present

## 2017-02-15 DIAGNOSIS — J189 Pneumonia, unspecified organism: Secondary | ICD-10-CM | POA: Diagnosis not present

## 2017-02-15 DIAGNOSIS — I10 Essential (primary) hypertension: Secondary | ICD-10-CM | POA: Diagnosis not present

## 2017-02-19 DIAGNOSIS — K219 Gastro-esophageal reflux disease without esophagitis: Secondary | ICD-10-CM | POA: Diagnosis not present

## 2017-02-19 DIAGNOSIS — I10 Essential (primary) hypertension: Secondary | ICD-10-CM | POA: Diagnosis not present

## 2017-02-19 DIAGNOSIS — N3281 Overactive bladder: Secondary | ICD-10-CM | POA: Diagnosis not present

## 2017-02-19 DIAGNOSIS — M059 Rheumatoid arthritis with rheumatoid factor, unspecified: Secondary | ICD-10-CM | POA: Diagnosis not present

## 2017-02-19 DIAGNOSIS — I5030 Unspecified diastolic (congestive) heart failure: Secondary | ICD-10-CM | POA: Diagnosis not present

## 2017-02-19 DIAGNOSIS — J939 Pneumothorax, unspecified: Secondary | ICD-10-CM | POA: Diagnosis not present

## 2017-02-19 DIAGNOSIS — F339 Major depressive disorder, recurrent, unspecified: Secondary | ICD-10-CM | POA: Diagnosis not present

## 2017-02-19 DIAGNOSIS — J189 Pneumonia, unspecified organism: Secondary | ICD-10-CM | POA: Diagnosis not present

## 2017-02-19 DIAGNOSIS — E039 Hypothyroidism, unspecified: Secondary | ICD-10-CM | POA: Diagnosis not present

## 2017-02-20 DIAGNOSIS — F339 Major depressive disorder, recurrent, unspecified: Secondary | ICD-10-CM | POA: Diagnosis not present

## 2017-02-20 DIAGNOSIS — J939 Pneumothorax, unspecified: Secondary | ICD-10-CM | POA: Diagnosis not present

## 2017-02-20 DIAGNOSIS — M059 Rheumatoid arthritis with rheumatoid factor, unspecified: Secondary | ICD-10-CM | POA: Diagnosis not present

## 2017-02-20 DIAGNOSIS — I10 Essential (primary) hypertension: Secondary | ICD-10-CM | POA: Diagnosis not present

## 2017-02-20 DIAGNOSIS — I5030 Unspecified diastolic (congestive) heart failure: Secondary | ICD-10-CM | POA: Diagnosis not present

## 2017-02-20 DIAGNOSIS — J189 Pneumonia, unspecified organism: Secondary | ICD-10-CM | POA: Diagnosis not present

## 2017-02-21 DIAGNOSIS — I5030 Unspecified diastolic (congestive) heart failure: Secondary | ICD-10-CM | POA: Diagnosis not present

## 2017-02-21 DIAGNOSIS — F339 Major depressive disorder, recurrent, unspecified: Secondary | ICD-10-CM | POA: Diagnosis not present

## 2017-02-21 DIAGNOSIS — I10 Essential (primary) hypertension: Secondary | ICD-10-CM | POA: Diagnosis not present

## 2017-02-21 DIAGNOSIS — J939 Pneumothorax, unspecified: Secondary | ICD-10-CM | POA: Diagnosis not present

## 2017-02-21 DIAGNOSIS — J189 Pneumonia, unspecified organism: Secondary | ICD-10-CM | POA: Diagnosis not present

## 2017-02-21 DIAGNOSIS — M059 Rheumatoid arthritis with rheumatoid factor, unspecified: Secondary | ICD-10-CM | POA: Diagnosis not present

## 2017-02-22 DIAGNOSIS — I5030 Unspecified diastolic (congestive) heart failure: Secondary | ICD-10-CM | POA: Diagnosis not present

## 2017-02-22 DIAGNOSIS — F339 Major depressive disorder, recurrent, unspecified: Secondary | ICD-10-CM | POA: Diagnosis not present

## 2017-02-22 DIAGNOSIS — I10 Essential (primary) hypertension: Secondary | ICD-10-CM | POA: Diagnosis not present

## 2017-02-22 DIAGNOSIS — J939 Pneumothorax, unspecified: Secondary | ICD-10-CM | POA: Diagnosis not present

## 2017-02-22 DIAGNOSIS — J189 Pneumonia, unspecified organism: Secondary | ICD-10-CM | POA: Diagnosis not present

## 2017-02-22 DIAGNOSIS — M059 Rheumatoid arthritis with rheumatoid factor, unspecified: Secondary | ICD-10-CM | POA: Diagnosis not present

## 2017-02-26 DIAGNOSIS — I5030 Unspecified diastolic (congestive) heart failure: Secondary | ICD-10-CM | POA: Diagnosis not present

## 2017-02-26 DIAGNOSIS — J189 Pneumonia, unspecified organism: Secondary | ICD-10-CM | POA: Diagnosis not present

## 2017-02-26 DIAGNOSIS — J939 Pneumothorax, unspecified: Secondary | ICD-10-CM | POA: Diagnosis not present

## 2017-02-26 DIAGNOSIS — F339 Major depressive disorder, recurrent, unspecified: Secondary | ICD-10-CM | POA: Diagnosis not present

## 2017-02-26 DIAGNOSIS — M059 Rheumatoid arthritis with rheumatoid factor, unspecified: Secondary | ICD-10-CM | POA: Diagnosis not present

## 2017-02-26 DIAGNOSIS — I10 Essential (primary) hypertension: Secondary | ICD-10-CM | POA: Diagnosis not present

## 2017-02-28 DIAGNOSIS — J189 Pneumonia, unspecified organism: Secondary | ICD-10-CM | POA: Diagnosis not present

## 2017-02-28 DIAGNOSIS — I10 Essential (primary) hypertension: Secondary | ICD-10-CM | POA: Diagnosis not present

## 2017-02-28 DIAGNOSIS — F339 Major depressive disorder, recurrent, unspecified: Secondary | ICD-10-CM | POA: Diagnosis not present

## 2017-02-28 DIAGNOSIS — M059 Rheumatoid arthritis with rheumatoid factor, unspecified: Secondary | ICD-10-CM | POA: Diagnosis not present

## 2017-02-28 DIAGNOSIS — I5030 Unspecified diastolic (congestive) heart failure: Secondary | ICD-10-CM | POA: Diagnosis not present

## 2017-02-28 DIAGNOSIS — J939 Pneumothorax, unspecified: Secondary | ICD-10-CM | POA: Diagnosis not present

## 2017-03-01 DIAGNOSIS — I10 Essential (primary) hypertension: Secondary | ICD-10-CM | POA: Diagnosis not present

## 2017-03-01 DIAGNOSIS — F339 Major depressive disorder, recurrent, unspecified: Secondary | ICD-10-CM | POA: Diagnosis not present

## 2017-03-01 DIAGNOSIS — I5030 Unspecified diastolic (congestive) heart failure: Secondary | ICD-10-CM | POA: Diagnosis not present

## 2017-03-01 DIAGNOSIS — M059 Rheumatoid arthritis with rheumatoid factor, unspecified: Secondary | ICD-10-CM | POA: Diagnosis not present

## 2017-03-01 DIAGNOSIS — J939 Pneumothorax, unspecified: Secondary | ICD-10-CM | POA: Diagnosis not present

## 2017-03-01 DIAGNOSIS — J189 Pneumonia, unspecified organism: Secondary | ICD-10-CM | POA: Diagnosis not present

## 2017-03-05 DIAGNOSIS — I5030 Unspecified diastolic (congestive) heart failure: Secondary | ICD-10-CM | POA: Diagnosis not present

## 2017-03-05 DIAGNOSIS — F339 Major depressive disorder, recurrent, unspecified: Secondary | ICD-10-CM | POA: Diagnosis not present

## 2017-03-05 DIAGNOSIS — J189 Pneumonia, unspecified organism: Secondary | ICD-10-CM | POA: Diagnosis not present

## 2017-03-05 DIAGNOSIS — I10 Essential (primary) hypertension: Secondary | ICD-10-CM | POA: Diagnosis not present

## 2017-03-05 DIAGNOSIS — M059 Rheumatoid arthritis with rheumatoid factor, unspecified: Secondary | ICD-10-CM | POA: Diagnosis not present

## 2017-03-05 DIAGNOSIS — J939 Pneumothorax, unspecified: Secondary | ICD-10-CM | POA: Diagnosis not present

## 2017-03-07 DIAGNOSIS — I5030 Unspecified diastolic (congestive) heart failure: Secondary | ICD-10-CM | POA: Diagnosis not present

## 2017-03-07 DIAGNOSIS — F339 Major depressive disorder, recurrent, unspecified: Secondary | ICD-10-CM | POA: Diagnosis not present

## 2017-03-07 DIAGNOSIS — I10 Essential (primary) hypertension: Secondary | ICD-10-CM | POA: Diagnosis not present

## 2017-03-07 DIAGNOSIS — J189 Pneumonia, unspecified organism: Secondary | ICD-10-CM | POA: Diagnosis not present

## 2017-03-07 DIAGNOSIS — J939 Pneumothorax, unspecified: Secondary | ICD-10-CM | POA: Diagnosis not present

## 2017-03-07 DIAGNOSIS — M059 Rheumatoid arthritis with rheumatoid factor, unspecified: Secondary | ICD-10-CM | POA: Diagnosis not present

## 2017-03-08 DIAGNOSIS — J939 Pneumothorax, unspecified: Secondary | ICD-10-CM | POA: Diagnosis not present

## 2017-03-08 DIAGNOSIS — I10 Essential (primary) hypertension: Secondary | ICD-10-CM | POA: Diagnosis not present

## 2017-03-08 DIAGNOSIS — M059 Rheumatoid arthritis with rheumatoid factor, unspecified: Secondary | ICD-10-CM | POA: Diagnosis not present

## 2017-03-08 DIAGNOSIS — I5030 Unspecified diastolic (congestive) heart failure: Secondary | ICD-10-CM | POA: Diagnosis not present

## 2017-03-08 DIAGNOSIS — F339 Major depressive disorder, recurrent, unspecified: Secondary | ICD-10-CM | POA: Diagnosis not present

## 2017-03-08 DIAGNOSIS — J189 Pneumonia, unspecified organism: Secondary | ICD-10-CM | POA: Diagnosis not present

## 2017-03-12 DIAGNOSIS — M059 Rheumatoid arthritis with rheumatoid factor, unspecified: Secondary | ICD-10-CM | POA: Diagnosis not present

## 2017-03-12 DIAGNOSIS — I10 Essential (primary) hypertension: Secondary | ICD-10-CM | POA: Diagnosis not present

## 2017-03-12 DIAGNOSIS — F339 Major depressive disorder, recurrent, unspecified: Secondary | ICD-10-CM | POA: Diagnosis not present

## 2017-03-12 DIAGNOSIS — J189 Pneumonia, unspecified organism: Secondary | ICD-10-CM | POA: Diagnosis not present

## 2017-03-12 DIAGNOSIS — J939 Pneumothorax, unspecified: Secondary | ICD-10-CM | POA: Diagnosis not present

## 2017-03-12 DIAGNOSIS — I5030 Unspecified diastolic (congestive) heart failure: Secondary | ICD-10-CM | POA: Diagnosis not present

## 2017-03-13 DIAGNOSIS — I10 Essential (primary) hypertension: Secondary | ICD-10-CM | POA: Diagnosis not present

## 2017-03-13 DIAGNOSIS — J939 Pneumothorax, unspecified: Secondary | ICD-10-CM | POA: Diagnosis not present

## 2017-03-13 DIAGNOSIS — J189 Pneumonia, unspecified organism: Secondary | ICD-10-CM | POA: Diagnosis not present

## 2017-03-13 DIAGNOSIS — F339 Major depressive disorder, recurrent, unspecified: Secondary | ICD-10-CM | POA: Diagnosis not present

## 2017-03-13 DIAGNOSIS — M059 Rheumatoid arthritis with rheumatoid factor, unspecified: Secondary | ICD-10-CM | POA: Diagnosis not present

## 2017-03-13 DIAGNOSIS — I5030 Unspecified diastolic (congestive) heart failure: Secondary | ICD-10-CM | POA: Diagnosis not present

## 2017-03-15 DIAGNOSIS — I10 Essential (primary) hypertension: Secondary | ICD-10-CM | POA: Diagnosis not present

## 2017-03-15 DIAGNOSIS — I5030 Unspecified diastolic (congestive) heart failure: Secondary | ICD-10-CM | POA: Diagnosis not present

## 2017-03-15 DIAGNOSIS — F339 Major depressive disorder, recurrent, unspecified: Secondary | ICD-10-CM | POA: Diagnosis not present

## 2017-03-15 DIAGNOSIS — J939 Pneumothorax, unspecified: Secondary | ICD-10-CM | POA: Diagnosis not present

## 2017-03-15 DIAGNOSIS — J189 Pneumonia, unspecified organism: Secondary | ICD-10-CM | POA: Diagnosis not present

## 2017-03-15 DIAGNOSIS — M059 Rheumatoid arthritis with rheumatoid factor, unspecified: Secondary | ICD-10-CM | POA: Diagnosis not present

## 2017-03-19 DIAGNOSIS — J189 Pneumonia, unspecified organism: Secondary | ICD-10-CM | POA: Diagnosis not present

## 2017-03-19 DIAGNOSIS — I10 Essential (primary) hypertension: Secondary | ICD-10-CM | POA: Diagnosis not present

## 2017-03-19 DIAGNOSIS — M059 Rheumatoid arthritis with rheumatoid factor, unspecified: Secondary | ICD-10-CM | POA: Diagnosis not present

## 2017-03-19 DIAGNOSIS — F339 Major depressive disorder, recurrent, unspecified: Secondary | ICD-10-CM | POA: Diagnosis not present

## 2017-03-19 DIAGNOSIS — J939 Pneumothorax, unspecified: Secondary | ICD-10-CM | POA: Diagnosis not present

## 2017-03-19 DIAGNOSIS — I5030 Unspecified diastolic (congestive) heart failure: Secondary | ICD-10-CM | POA: Diagnosis not present

## 2017-03-22 DIAGNOSIS — M059 Rheumatoid arthritis with rheumatoid factor, unspecified: Secondary | ICD-10-CM | POA: Diagnosis not present

## 2017-03-22 DIAGNOSIS — J189 Pneumonia, unspecified organism: Secondary | ICD-10-CM | POA: Diagnosis not present

## 2017-03-22 DIAGNOSIS — K219 Gastro-esophageal reflux disease without esophagitis: Secondary | ICD-10-CM | POA: Diagnosis not present

## 2017-03-22 DIAGNOSIS — J939 Pneumothorax, unspecified: Secondary | ICD-10-CM | POA: Diagnosis not present

## 2017-03-22 DIAGNOSIS — I10 Essential (primary) hypertension: Secondary | ICD-10-CM | POA: Diagnosis not present

## 2017-03-22 DIAGNOSIS — N3281 Overactive bladder: Secondary | ICD-10-CM | POA: Diagnosis not present

## 2017-03-22 DIAGNOSIS — F339 Major depressive disorder, recurrent, unspecified: Secondary | ICD-10-CM | POA: Diagnosis not present

## 2017-03-22 DIAGNOSIS — I5030 Unspecified diastolic (congestive) heart failure: Secondary | ICD-10-CM | POA: Diagnosis not present

## 2017-03-22 DIAGNOSIS — E039 Hypothyroidism, unspecified: Secondary | ICD-10-CM | POA: Diagnosis not present

## 2017-03-25 DIAGNOSIS — F339 Major depressive disorder, recurrent, unspecified: Secondary | ICD-10-CM | POA: Diagnosis not present

## 2017-03-25 DIAGNOSIS — I10 Essential (primary) hypertension: Secondary | ICD-10-CM | POA: Diagnosis not present

## 2017-03-25 DIAGNOSIS — I5030 Unspecified diastolic (congestive) heart failure: Secondary | ICD-10-CM | POA: Diagnosis not present

## 2017-03-25 DIAGNOSIS — J939 Pneumothorax, unspecified: Secondary | ICD-10-CM | POA: Diagnosis not present

## 2017-03-25 DIAGNOSIS — M059 Rheumatoid arthritis with rheumatoid factor, unspecified: Secondary | ICD-10-CM | POA: Diagnosis not present

## 2017-03-25 DIAGNOSIS — J189 Pneumonia, unspecified organism: Secondary | ICD-10-CM | POA: Diagnosis not present

## 2017-03-26 DIAGNOSIS — M059 Rheumatoid arthritis with rheumatoid factor, unspecified: Secondary | ICD-10-CM | POA: Diagnosis not present

## 2017-03-26 DIAGNOSIS — F339 Major depressive disorder, recurrent, unspecified: Secondary | ICD-10-CM | POA: Diagnosis not present

## 2017-03-26 DIAGNOSIS — I10 Essential (primary) hypertension: Secondary | ICD-10-CM | POA: Diagnosis not present

## 2017-03-26 DIAGNOSIS — J189 Pneumonia, unspecified organism: Secondary | ICD-10-CM | POA: Diagnosis not present

## 2017-03-26 DIAGNOSIS — I5030 Unspecified diastolic (congestive) heart failure: Secondary | ICD-10-CM | POA: Diagnosis not present

## 2017-03-26 DIAGNOSIS — J939 Pneumothorax, unspecified: Secondary | ICD-10-CM | POA: Diagnosis not present

## 2017-03-29 DIAGNOSIS — J939 Pneumothorax, unspecified: Secondary | ICD-10-CM | POA: Diagnosis not present

## 2017-03-29 DIAGNOSIS — I5030 Unspecified diastolic (congestive) heart failure: Secondary | ICD-10-CM | POA: Diagnosis not present

## 2017-03-29 DIAGNOSIS — F339 Major depressive disorder, recurrent, unspecified: Secondary | ICD-10-CM | POA: Diagnosis not present

## 2017-03-29 DIAGNOSIS — J189 Pneumonia, unspecified organism: Secondary | ICD-10-CM | POA: Diagnosis not present

## 2017-03-29 DIAGNOSIS — M059 Rheumatoid arthritis with rheumatoid factor, unspecified: Secondary | ICD-10-CM | POA: Diagnosis not present

## 2017-03-29 DIAGNOSIS — I10 Essential (primary) hypertension: Secondary | ICD-10-CM | POA: Diagnosis not present

## 2017-04-01 DIAGNOSIS — F339 Major depressive disorder, recurrent, unspecified: Secondary | ICD-10-CM | POA: Diagnosis not present

## 2017-04-01 DIAGNOSIS — I10 Essential (primary) hypertension: Secondary | ICD-10-CM | POA: Diagnosis not present

## 2017-04-01 DIAGNOSIS — J189 Pneumonia, unspecified organism: Secondary | ICD-10-CM | POA: Diagnosis not present

## 2017-04-01 DIAGNOSIS — M059 Rheumatoid arthritis with rheumatoid factor, unspecified: Secondary | ICD-10-CM | POA: Diagnosis not present

## 2017-04-01 DIAGNOSIS — I5030 Unspecified diastolic (congestive) heart failure: Secondary | ICD-10-CM | POA: Diagnosis not present

## 2017-04-01 DIAGNOSIS — J939 Pneumothorax, unspecified: Secondary | ICD-10-CM | POA: Diagnosis not present

## 2017-04-02 DIAGNOSIS — F339 Major depressive disorder, recurrent, unspecified: Secondary | ICD-10-CM | POA: Diagnosis not present

## 2017-04-02 DIAGNOSIS — M059 Rheumatoid arthritis with rheumatoid factor, unspecified: Secondary | ICD-10-CM | POA: Diagnosis not present

## 2017-04-02 DIAGNOSIS — I5030 Unspecified diastolic (congestive) heart failure: Secondary | ICD-10-CM | POA: Diagnosis not present

## 2017-04-02 DIAGNOSIS — J939 Pneumothorax, unspecified: Secondary | ICD-10-CM | POA: Diagnosis not present

## 2017-04-02 DIAGNOSIS — I10 Essential (primary) hypertension: Secondary | ICD-10-CM | POA: Diagnosis not present

## 2017-04-02 DIAGNOSIS — J189 Pneumonia, unspecified organism: Secondary | ICD-10-CM | POA: Diagnosis not present

## 2017-04-05 DIAGNOSIS — F339 Major depressive disorder, recurrent, unspecified: Secondary | ICD-10-CM | POA: Diagnosis not present

## 2017-04-05 DIAGNOSIS — I5030 Unspecified diastolic (congestive) heart failure: Secondary | ICD-10-CM | POA: Diagnosis not present

## 2017-04-05 DIAGNOSIS — J939 Pneumothorax, unspecified: Secondary | ICD-10-CM | POA: Diagnosis not present

## 2017-04-05 DIAGNOSIS — M059 Rheumatoid arthritis with rheumatoid factor, unspecified: Secondary | ICD-10-CM | POA: Diagnosis not present

## 2017-04-05 DIAGNOSIS — J189 Pneumonia, unspecified organism: Secondary | ICD-10-CM | POA: Diagnosis not present

## 2017-04-05 DIAGNOSIS — I10 Essential (primary) hypertension: Secondary | ICD-10-CM | POA: Diagnosis not present

## 2017-04-09 DIAGNOSIS — J939 Pneumothorax, unspecified: Secondary | ICD-10-CM | POA: Diagnosis not present

## 2017-04-09 DIAGNOSIS — J189 Pneumonia, unspecified organism: Secondary | ICD-10-CM | POA: Diagnosis not present

## 2017-04-09 DIAGNOSIS — F339 Major depressive disorder, recurrent, unspecified: Secondary | ICD-10-CM | POA: Diagnosis not present

## 2017-04-09 DIAGNOSIS — I5030 Unspecified diastolic (congestive) heart failure: Secondary | ICD-10-CM | POA: Diagnosis not present

## 2017-04-09 DIAGNOSIS — M059 Rheumatoid arthritis with rheumatoid factor, unspecified: Secondary | ICD-10-CM | POA: Diagnosis not present

## 2017-04-09 DIAGNOSIS — I10 Essential (primary) hypertension: Secondary | ICD-10-CM | POA: Diagnosis not present

## 2017-04-11 DIAGNOSIS — J189 Pneumonia, unspecified organism: Secondary | ICD-10-CM | POA: Diagnosis not present

## 2017-04-11 DIAGNOSIS — M059 Rheumatoid arthritis with rheumatoid factor, unspecified: Secondary | ICD-10-CM | POA: Diagnosis not present

## 2017-04-11 DIAGNOSIS — F339 Major depressive disorder, recurrent, unspecified: Secondary | ICD-10-CM | POA: Diagnosis not present

## 2017-04-11 DIAGNOSIS — I5030 Unspecified diastolic (congestive) heart failure: Secondary | ICD-10-CM | POA: Diagnosis not present

## 2017-04-11 DIAGNOSIS — I10 Essential (primary) hypertension: Secondary | ICD-10-CM | POA: Diagnosis not present

## 2017-04-11 DIAGNOSIS — J939 Pneumothorax, unspecified: Secondary | ICD-10-CM | POA: Diagnosis not present

## 2017-04-16 DIAGNOSIS — I10 Essential (primary) hypertension: Secondary | ICD-10-CM | POA: Diagnosis not present

## 2017-04-16 DIAGNOSIS — J189 Pneumonia, unspecified organism: Secondary | ICD-10-CM | POA: Diagnosis not present

## 2017-04-16 DIAGNOSIS — I5030 Unspecified diastolic (congestive) heart failure: Secondary | ICD-10-CM | POA: Diagnosis not present

## 2017-04-16 DIAGNOSIS — J939 Pneumothorax, unspecified: Secondary | ICD-10-CM | POA: Diagnosis not present

## 2017-04-16 DIAGNOSIS — F339 Major depressive disorder, recurrent, unspecified: Secondary | ICD-10-CM | POA: Diagnosis not present

## 2017-04-16 DIAGNOSIS — M059 Rheumatoid arthritis with rheumatoid factor, unspecified: Secondary | ICD-10-CM | POA: Diagnosis not present

## 2017-04-18 DIAGNOSIS — I10 Essential (primary) hypertension: Secondary | ICD-10-CM | POA: Diagnosis not present

## 2017-04-18 DIAGNOSIS — F339 Major depressive disorder, recurrent, unspecified: Secondary | ICD-10-CM | POA: Diagnosis not present

## 2017-04-18 DIAGNOSIS — M059 Rheumatoid arthritis with rheumatoid factor, unspecified: Secondary | ICD-10-CM | POA: Diagnosis not present

## 2017-04-18 DIAGNOSIS — J189 Pneumonia, unspecified organism: Secondary | ICD-10-CM | POA: Diagnosis not present

## 2017-04-18 DIAGNOSIS — I5030 Unspecified diastolic (congestive) heart failure: Secondary | ICD-10-CM | POA: Diagnosis not present

## 2017-04-18 DIAGNOSIS — J939 Pneumothorax, unspecified: Secondary | ICD-10-CM | POA: Diagnosis not present

## 2017-04-19 DIAGNOSIS — I10 Essential (primary) hypertension: Secondary | ICD-10-CM | POA: Diagnosis not present

## 2017-04-19 DIAGNOSIS — J189 Pneumonia, unspecified organism: Secondary | ICD-10-CM | POA: Diagnosis not present

## 2017-04-19 DIAGNOSIS — F339 Major depressive disorder, recurrent, unspecified: Secondary | ICD-10-CM | POA: Diagnosis not present

## 2017-04-19 DIAGNOSIS — M059 Rheumatoid arthritis with rheumatoid factor, unspecified: Secondary | ICD-10-CM | POA: Diagnosis not present

## 2017-04-19 DIAGNOSIS — I5030 Unspecified diastolic (congestive) heart failure: Secondary | ICD-10-CM | POA: Diagnosis not present

## 2017-04-19 DIAGNOSIS — J939 Pneumothorax, unspecified: Secondary | ICD-10-CM | POA: Diagnosis not present

## 2017-04-21 DIAGNOSIS — J189 Pneumonia, unspecified organism: Secondary | ICD-10-CM | POA: Diagnosis not present

## 2017-04-21 DIAGNOSIS — I5030 Unspecified diastolic (congestive) heart failure: Secondary | ICD-10-CM | POA: Diagnosis not present

## 2017-04-21 DIAGNOSIS — F339 Major depressive disorder, recurrent, unspecified: Secondary | ICD-10-CM | POA: Diagnosis not present

## 2017-04-21 DIAGNOSIS — J939 Pneumothorax, unspecified: Secondary | ICD-10-CM | POA: Diagnosis not present

## 2017-04-21 DIAGNOSIS — M059 Rheumatoid arthritis with rheumatoid factor, unspecified: Secondary | ICD-10-CM | POA: Diagnosis not present

## 2017-04-21 DIAGNOSIS — K219 Gastro-esophageal reflux disease without esophagitis: Secondary | ICD-10-CM | POA: Diagnosis not present

## 2017-04-21 DIAGNOSIS — N3281 Overactive bladder: Secondary | ICD-10-CM | POA: Diagnosis not present

## 2017-04-21 DIAGNOSIS — I10 Essential (primary) hypertension: Secondary | ICD-10-CM | POA: Diagnosis not present

## 2017-04-21 DIAGNOSIS — E039 Hypothyroidism, unspecified: Secondary | ICD-10-CM | POA: Diagnosis not present

## 2017-04-23 DIAGNOSIS — J939 Pneumothorax, unspecified: Secondary | ICD-10-CM | POA: Diagnosis not present

## 2017-04-23 DIAGNOSIS — I5030 Unspecified diastolic (congestive) heart failure: Secondary | ICD-10-CM | POA: Diagnosis not present

## 2017-04-23 DIAGNOSIS — I10 Essential (primary) hypertension: Secondary | ICD-10-CM | POA: Diagnosis not present

## 2017-04-23 DIAGNOSIS — M059 Rheumatoid arthritis with rheumatoid factor, unspecified: Secondary | ICD-10-CM | POA: Diagnosis not present

## 2017-04-23 DIAGNOSIS — J189 Pneumonia, unspecified organism: Secondary | ICD-10-CM | POA: Diagnosis not present

## 2017-04-23 DIAGNOSIS — F339 Major depressive disorder, recurrent, unspecified: Secondary | ICD-10-CM | POA: Diagnosis not present

## 2017-04-26 DIAGNOSIS — F339 Major depressive disorder, recurrent, unspecified: Secondary | ICD-10-CM | POA: Diagnosis not present

## 2017-04-26 DIAGNOSIS — I5030 Unspecified diastolic (congestive) heart failure: Secondary | ICD-10-CM | POA: Diagnosis not present

## 2017-04-26 DIAGNOSIS — J189 Pneumonia, unspecified organism: Secondary | ICD-10-CM | POA: Diagnosis not present

## 2017-04-26 DIAGNOSIS — J939 Pneumothorax, unspecified: Secondary | ICD-10-CM | POA: Diagnosis not present

## 2017-04-26 DIAGNOSIS — I10 Essential (primary) hypertension: Secondary | ICD-10-CM | POA: Diagnosis not present

## 2017-04-26 DIAGNOSIS — M059 Rheumatoid arthritis with rheumatoid factor, unspecified: Secondary | ICD-10-CM | POA: Diagnosis not present

## 2017-04-30 DIAGNOSIS — F339 Major depressive disorder, recurrent, unspecified: Secondary | ICD-10-CM | POA: Diagnosis not present

## 2017-04-30 DIAGNOSIS — J939 Pneumothorax, unspecified: Secondary | ICD-10-CM | POA: Diagnosis not present

## 2017-04-30 DIAGNOSIS — M059 Rheumatoid arthritis with rheumatoid factor, unspecified: Secondary | ICD-10-CM | POA: Diagnosis not present

## 2017-04-30 DIAGNOSIS — I10 Essential (primary) hypertension: Secondary | ICD-10-CM | POA: Diagnosis not present

## 2017-04-30 DIAGNOSIS — J189 Pneumonia, unspecified organism: Secondary | ICD-10-CM | POA: Diagnosis not present

## 2017-04-30 DIAGNOSIS — I5030 Unspecified diastolic (congestive) heart failure: Secondary | ICD-10-CM | POA: Diagnosis not present

## 2017-05-01 DIAGNOSIS — I5030 Unspecified diastolic (congestive) heart failure: Secondary | ICD-10-CM | POA: Diagnosis not present

## 2017-05-01 DIAGNOSIS — J189 Pneumonia, unspecified organism: Secondary | ICD-10-CM | POA: Diagnosis not present

## 2017-05-01 DIAGNOSIS — J939 Pneumothorax, unspecified: Secondary | ICD-10-CM | POA: Diagnosis not present

## 2017-05-01 DIAGNOSIS — M059 Rheumatoid arthritis with rheumatoid factor, unspecified: Secondary | ICD-10-CM | POA: Diagnosis not present

## 2017-05-01 DIAGNOSIS — I10 Essential (primary) hypertension: Secondary | ICD-10-CM | POA: Diagnosis not present

## 2017-05-01 DIAGNOSIS — F339 Major depressive disorder, recurrent, unspecified: Secondary | ICD-10-CM | POA: Diagnosis not present

## 2017-05-03 DIAGNOSIS — J939 Pneumothorax, unspecified: Secondary | ICD-10-CM | POA: Diagnosis not present

## 2017-05-03 DIAGNOSIS — F339 Major depressive disorder, recurrent, unspecified: Secondary | ICD-10-CM | POA: Diagnosis not present

## 2017-05-03 DIAGNOSIS — I5030 Unspecified diastolic (congestive) heart failure: Secondary | ICD-10-CM | POA: Diagnosis not present

## 2017-05-03 DIAGNOSIS — M059 Rheumatoid arthritis with rheumatoid factor, unspecified: Secondary | ICD-10-CM | POA: Diagnosis not present

## 2017-05-03 DIAGNOSIS — J189 Pneumonia, unspecified organism: Secondary | ICD-10-CM | POA: Diagnosis not present

## 2017-05-03 DIAGNOSIS — I10 Essential (primary) hypertension: Secondary | ICD-10-CM | POA: Diagnosis not present

## 2017-05-08 DIAGNOSIS — F339 Major depressive disorder, recurrent, unspecified: Secondary | ICD-10-CM | POA: Diagnosis not present

## 2017-05-08 DIAGNOSIS — M059 Rheumatoid arthritis with rheumatoid factor, unspecified: Secondary | ICD-10-CM | POA: Diagnosis not present

## 2017-05-08 DIAGNOSIS — J939 Pneumothorax, unspecified: Secondary | ICD-10-CM | POA: Diagnosis not present

## 2017-05-08 DIAGNOSIS — I5030 Unspecified diastolic (congestive) heart failure: Secondary | ICD-10-CM | POA: Diagnosis not present

## 2017-05-08 DIAGNOSIS — J189 Pneumonia, unspecified organism: Secondary | ICD-10-CM | POA: Diagnosis not present

## 2017-05-08 DIAGNOSIS — I10 Essential (primary) hypertension: Secondary | ICD-10-CM | POA: Diagnosis not present

## 2017-05-10 DIAGNOSIS — F339 Major depressive disorder, recurrent, unspecified: Secondary | ICD-10-CM | POA: Diagnosis not present

## 2017-05-10 DIAGNOSIS — M059 Rheumatoid arthritis with rheumatoid factor, unspecified: Secondary | ICD-10-CM | POA: Diagnosis not present

## 2017-05-10 DIAGNOSIS — J939 Pneumothorax, unspecified: Secondary | ICD-10-CM | POA: Diagnosis not present

## 2017-05-10 DIAGNOSIS — J189 Pneumonia, unspecified organism: Secondary | ICD-10-CM | POA: Diagnosis not present

## 2017-05-10 DIAGNOSIS — I10 Essential (primary) hypertension: Secondary | ICD-10-CM | POA: Diagnosis not present

## 2017-05-10 DIAGNOSIS — I5030 Unspecified diastolic (congestive) heart failure: Secondary | ICD-10-CM | POA: Diagnosis not present

## 2017-05-15 DIAGNOSIS — I10 Essential (primary) hypertension: Secondary | ICD-10-CM | POA: Diagnosis not present

## 2017-05-15 DIAGNOSIS — M059 Rheumatoid arthritis with rheumatoid factor, unspecified: Secondary | ICD-10-CM | POA: Diagnosis not present

## 2017-05-15 DIAGNOSIS — F339 Major depressive disorder, recurrent, unspecified: Secondary | ICD-10-CM | POA: Diagnosis not present

## 2017-05-15 DIAGNOSIS — I5030 Unspecified diastolic (congestive) heart failure: Secondary | ICD-10-CM | POA: Diagnosis not present

## 2017-05-15 DIAGNOSIS — J189 Pneumonia, unspecified organism: Secondary | ICD-10-CM | POA: Diagnosis not present

## 2017-05-15 DIAGNOSIS — J939 Pneumothorax, unspecified: Secondary | ICD-10-CM | POA: Diagnosis not present

## 2017-05-17 DIAGNOSIS — J939 Pneumothorax, unspecified: Secondary | ICD-10-CM | POA: Diagnosis not present

## 2017-05-17 DIAGNOSIS — M059 Rheumatoid arthritis with rheumatoid factor, unspecified: Secondary | ICD-10-CM | POA: Diagnosis not present

## 2017-05-17 DIAGNOSIS — I10 Essential (primary) hypertension: Secondary | ICD-10-CM | POA: Diagnosis not present

## 2017-05-17 DIAGNOSIS — I5030 Unspecified diastolic (congestive) heart failure: Secondary | ICD-10-CM | POA: Diagnosis not present

## 2017-05-17 DIAGNOSIS — F339 Major depressive disorder, recurrent, unspecified: Secondary | ICD-10-CM | POA: Diagnosis not present

## 2017-05-17 DIAGNOSIS — J189 Pneumonia, unspecified organism: Secondary | ICD-10-CM | POA: Diagnosis not present

## 2017-05-21 DIAGNOSIS — F339 Major depressive disorder, recurrent, unspecified: Secondary | ICD-10-CM | POA: Diagnosis not present

## 2017-05-21 DIAGNOSIS — M059 Rheumatoid arthritis with rheumatoid factor, unspecified: Secondary | ICD-10-CM | POA: Diagnosis not present

## 2017-05-21 DIAGNOSIS — I10 Essential (primary) hypertension: Secondary | ICD-10-CM | POA: Diagnosis not present

## 2017-05-21 DIAGNOSIS — J939 Pneumothorax, unspecified: Secondary | ICD-10-CM | POA: Diagnosis not present

## 2017-05-21 DIAGNOSIS — J189 Pneumonia, unspecified organism: Secondary | ICD-10-CM | POA: Diagnosis not present

## 2017-05-21 DIAGNOSIS — I5030 Unspecified diastolic (congestive) heart failure: Secondary | ICD-10-CM | POA: Diagnosis not present

## 2017-05-22 DIAGNOSIS — M059 Rheumatoid arthritis with rheumatoid factor, unspecified: Secondary | ICD-10-CM | POA: Diagnosis not present

## 2017-05-22 DIAGNOSIS — J189 Pneumonia, unspecified organism: Secondary | ICD-10-CM | POA: Diagnosis not present

## 2017-05-22 DIAGNOSIS — I10 Essential (primary) hypertension: Secondary | ICD-10-CM | POA: Diagnosis not present

## 2017-05-22 DIAGNOSIS — F339 Major depressive disorder, recurrent, unspecified: Secondary | ICD-10-CM | POA: Diagnosis not present

## 2017-05-22 DIAGNOSIS — I5030 Unspecified diastolic (congestive) heart failure: Secondary | ICD-10-CM | POA: Diagnosis not present

## 2017-05-22 DIAGNOSIS — N3281 Overactive bladder: Secondary | ICD-10-CM | POA: Diagnosis not present

## 2017-05-22 DIAGNOSIS — K219 Gastro-esophageal reflux disease without esophagitis: Secondary | ICD-10-CM | POA: Diagnosis not present

## 2017-05-22 DIAGNOSIS — J939 Pneumothorax, unspecified: Secondary | ICD-10-CM | POA: Diagnosis not present

## 2017-05-22 DIAGNOSIS — E039 Hypothyroidism, unspecified: Secondary | ICD-10-CM | POA: Diagnosis not present

## 2017-05-24 DIAGNOSIS — M059 Rheumatoid arthritis with rheumatoid factor, unspecified: Secondary | ICD-10-CM | POA: Diagnosis not present

## 2017-05-24 DIAGNOSIS — J939 Pneumothorax, unspecified: Secondary | ICD-10-CM | POA: Diagnosis not present

## 2017-05-24 DIAGNOSIS — I5030 Unspecified diastolic (congestive) heart failure: Secondary | ICD-10-CM | POA: Diagnosis not present

## 2017-05-24 DIAGNOSIS — I10 Essential (primary) hypertension: Secondary | ICD-10-CM | POA: Diagnosis not present

## 2017-05-24 DIAGNOSIS — F339 Major depressive disorder, recurrent, unspecified: Secondary | ICD-10-CM | POA: Diagnosis not present

## 2017-05-24 DIAGNOSIS — J189 Pneumonia, unspecified organism: Secondary | ICD-10-CM | POA: Diagnosis not present

## 2017-05-29 DIAGNOSIS — J939 Pneumothorax, unspecified: Secondary | ICD-10-CM | POA: Diagnosis not present

## 2017-05-29 DIAGNOSIS — M059 Rheumatoid arthritis with rheumatoid factor, unspecified: Secondary | ICD-10-CM | POA: Diagnosis not present

## 2017-05-29 DIAGNOSIS — J189 Pneumonia, unspecified organism: Secondary | ICD-10-CM | POA: Diagnosis not present

## 2017-05-29 DIAGNOSIS — F339 Major depressive disorder, recurrent, unspecified: Secondary | ICD-10-CM | POA: Diagnosis not present

## 2017-05-29 DIAGNOSIS — I5030 Unspecified diastolic (congestive) heart failure: Secondary | ICD-10-CM | POA: Diagnosis not present

## 2017-05-29 DIAGNOSIS — I10 Essential (primary) hypertension: Secondary | ICD-10-CM | POA: Diagnosis not present

## 2017-05-31 DIAGNOSIS — M059 Rheumatoid arthritis with rheumatoid factor, unspecified: Secondary | ICD-10-CM | POA: Diagnosis not present

## 2017-05-31 DIAGNOSIS — I5030 Unspecified diastolic (congestive) heart failure: Secondary | ICD-10-CM | POA: Diagnosis not present

## 2017-05-31 DIAGNOSIS — I10 Essential (primary) hypertension: Secondary | ICD-10-CM | POA: Diagnosis not present

## 2017-05-31 DIAGNOSIS — J189 Pneumonia, unspecified organism: Secondary | ICD-10-CM | POA: Diagnosis not present

## 2017-05-31 DIAGNOSIS — F339 Major depressive disorder, recurrent, unspecified: Secondary | ICD-10-CM | POA: Diagnosis not present

## 2017-05-31 DIAGNOSIS — J939 Pneumothorax, unspecified: Secondary | ICD-10-CM | POA: Diagnosis not present

## 2017-06-03 DIAGNOSIS — F339 Major depressive disorder, recurrent, unspecified: Secondary | ICD-10-CM | POA: Diagnosis not present

## 2017-06-03 DIAGNOSIS — J189 Pneumonia, unspecified organism: Secondary | ICD-10-CM | POA: Diagnosis not present

## 2017-06-03 DIAGNOSIS — I10 Essential (primary) hypertension: Secondary | ICD-10-CM | POA: Diagnosis not present

## 2017-06-03 DIAGNOSIS — M059 Rheumatoid arthritis with rheumatoid factor, unspecified: Secondary | ICD-10-CM | POA: Diagnosis not present

## 2017-06-03 DIAGNOSIS — I5030 Unspecified diastolic (congestive) heart failure: Secondary | ICD-10-CM | POA: Diagnosis not present

## 2017-06-03 DIAGNOSIS — J939 Pneumothorax, unspecified: Secondary | ICD-10-CM | POA: Diagnosis not present

## 2017-06-05 DIAGNOSIS — M059 Rheumatoid arthritis with rheumatoid factor, unspecified: Secondary | ICD-10-CM | POA: Diagnosis not present

## 2017-06-05 DIAGNOSIS — J189 Pneumonia, unspecified organism: Secondary | ICD-10-CM | POA: Diagnosis not present

## 2017-06-05 DIAGNOSIS — J939 Pneumothorax, unspecified: Secondary | ICD-10-CM | POA: Diagnosis not present

## 2017-06-05 DIAGNOSIS — F339 Major depressive disorder, recurrent, unspecified: Secondary | ICD-10-CM | POA: Diagnosis not present

## 2017-06-05 DIAGNOSIS — I5030 Unspecified diastolic (congestive) heart failure: Secondary | ICD-10-CM | POA: Diagnosis not present

## 2017-06-05 DIAGNOSIS — I10 Essential (primary) hypertension: Secondary | ICD-10-CM | POA: Diagnosis not present

## 2017-06-07 DIAGNOSIS — F339 Major depressive disorder, recurrent, unspecified: Secondary | ICD-10-CM | POA: Diagnosis not present

## 2017-06-07 DIAGNOSIS — J939 Pneumothorax, unspecified: Secondary | ICD-10-CM | POA: Diagnosis not present

## 2017-06-07 DIAGNOSIS — I10 Essential (primary) hypertension: Secondary | ICD-10-CM | POA: Diagnosis not present

## 2017-06-07 DIAGNOSIS — I5030 Unspecified diastolic (congestive) heart failure: Secondary | ICD-10-CM | POA: Diagnosis not present

## 2017-06-07 DIAGNOSIS — J189 Pneumonia, unspecified organism: Secondary | ICD-10-CM | POA: Diagnosis not present

## 2017-06-07 DIAGNOSIS — M059 Rheumatoid arthritis with rheumatoid factor, unspecified: Secondary | ICD-10-CM | POA: Diagnosis not present

## 2017-06-12 DIAGNOSIS — I10 Essential (primary) hypertension: Secondary | ICD-10-CM | POA: Diagnosis not present

## 2017-06-12 DIAGNOSIS — M059 Rheumatoid arthritis with rheumatoid factor, unspecified: Secondary | ICD-10-CM | POA: Diagnosis not present

## 2017-06-12 DIAGNOSIS — J189 Pneumonia, unspecified organism: Secondary | ICD-10-CM | POA: Diagnosis not present

## 2017-06-12 DIAGNOSIS — F339 Major depressive disorder, recurrent, unspecified: Secondary | ICD-10-CM | POA: Diagnosis not present

## 2017-06-12 DIAGNOSIS — I5030 Unspecified diastolic (congestive) heart failure: Secondary | ICD-10-CM | POA: Diagnosis not present

## 2017-06-12 DIAGNOSIS — J939 Pneumothorax, unspecified: Secondary | ICD-10-CM | POA: Diagnosis not present

## 2017-06-14 DIAGNOSIS — J939 Pneumothorax, unspecified: Secondary | ICD-10-CM | POA: Diagnosis not present

## 2017-06-14 DIAGNOSIS — I10 Essential (primary) hypertension: Secondary | ICD-10-CM | POA: Diagnosis not present

## 2017-06-14 DIAGNOSIS — F339 Major depressive disorder, recurrent, unspecified: Secondary | ICD-10-CM | POA: Diagnosis not present

## 2017-06-14 DIAGNOSIS — J189 Pneumonia, unspecified organism: Secondary | ICD-10-CM | POA: Diagnosis not present

## 2017-06-14 DIAGNOSIS — I5030 Unspecified diastolic (congestive) heart failure: Secondary | ICD-10-CM | POA: Diagnosis not present

## 2017-06-14 DIAGNOSIS — M059 Rheumatoid arthritis with rheumatoid factor, unspecified: Secondary | ICD-10-CM | POA: Diagnosis not present

## 2017-06-18 DIAGNOSIS — I10 Essential (primary) hypertension: Secondary | ICD-10-CM | POA: Diagnosis not present

## 2017-06-18 DIAGNOSIS — M059 Rheumatoid arthritis with rheumatoid factor, unspecified: Secondary | ICD-10-CM | POA: Diagnosis not present

## 2017-06-18 DIAGNOSIS — J189 Pneumonia, unspecified organism: Secondary | ICD-10-CM | POA: Diagnosis not present

## 2017-06-18 DIAGNOSIS — I5030 Unspecified diastolic (congestive) heart failure: Secondary | ICD-10-CM | POA: Diagnosis not present

## 2017-06-18 DIAGNOSIS — J939 Pneumothorax, unspecified: Secondary | ICD-10-CM | POA: Diagnosis not present

## 2017-06-18 DIAGNOSIS — F339 Major depressive disorder, recurrent, unspecified: Secondary | ICD-10-CM | POA: Diagnosis not present

## 2017-06-19 DIAGNOSIS — J939 Pneumothorax, unspecified: Secondary | ICD-10-CM | POA: Diagnosis not present

## 2017-06-19 DIAGNOSIS — I10 Essential (primary) hypertension: Secondary | ICD-10-CM | POA: Diagnosis not present

## 2017-06-19 DIAGNOSIS — M059 Rheumatoid arthritis with rheumatoid factor, unspecified: Secondary | ICD-10-CM | POA: Diagnosis not present

## 2017-06-19 DIAGNOSIS — F339 Major depressive disorder, recurrent, unspecified: Secondary | ICD-10-CM | POA: Diagnosis not present

## 2017-06-19 DIAGNOSIS — J189 Pneumonia, unspecified organism: Secondary | ICD-10-CM | POA: Diagnosis not present

## 2017-06-19 DIAGNOSIS — I5030 Unspecified diastolic (congestive) heart failure: Secondary | ICD-10-CM | POA: Diagnosis not present

## 2017-06-20 DIAGNOSIS — J189 Pneumonia, unspecified organism: Secondary | ICD-10-CM | POA: Diagnosis not present

## 2017-06-20 DIAGNOSIS — I10 Essential (primary) hypertension: Secondary | ICD-10-CM | POA: Diagnosis not present

## 2017-06-20 DIAGNOSIS — I5030 Unspecified diastolic (congestive) heart failure: Secondary | ICD-10-CM | POA: Diagnosis not present

## 2017-06-20 DIAGNOSIS — J939 Pneumothorax, unspecified: Secondary | ICD-10-CM | POA: Diagnosis not present

## 2017-06-20 DIAGNOSIS — F339 Major depressive disorder, recurrent, unspecified: Secondary | ICD-10-CM | POA: Diagnosis not present

## 2017-06-20 DIAGNOSIS — M059 Rheumatoid arthritis with rheumatoid factor, unspecified: Secondary | ICD-10-CM | POA: Diagnosis not present

## 2017-06-21 DIAGNOSIS — I5030 Unspecified diastolic (congestive) heart failure: Secondary | ICD-10-CM | POA: Diagnosis not present

## 2017-06-21 DIAGNOSIS — M059 Rheumatoid arthritis with rheumatoid factor, unspecified: Secondary | ICD-10-CM | POA: Diagnosis not present

## 2017-06-21 DIAGNOSIS — I10 Essential (primary) hypertension: Secondary | ICD-10-CM | POA: Diagnosis not present

## 2017-06-21 DIAGNOSIS — J939 Pneumothorax, unspecified: Secondary | ICD-10-CM | POA: Diagnosis not present

## 2017-06-21 DIAGNOSIS — F339 Major depressive disorder, recurrent, unspecified: Secondary | ICD-10-CM | POA: Diagnosis not present

## 2017-06-21 DIAGNOSIS — J189 Pneumonia, unspecified organism: Secondary | ICD-10-CM | POA: Diagnosis not present

## 2017-06-22 DIAGNOSIS — I10 Essential (primary) hypertension: Secondary | ICD-10-CM | POA: Diagnosis not present

## 2017-06-22 DIAGNOSIS — J939 Pneumothorax, unspecified: Secondary | ICD-10-CM | POA: Diagnosis not present

## 2017-06-22 DIAGNOSIS — J189 Pneumonia, unspecified organism: Secondary | ICD-10-CM | POA: Diagnosis not present

## 2017-06-22 DIAGNOSIS — F339 Major depressive disorder, recurrent, unspecified: Secondary | ICD-10-CM | POA: Diagnosis not present

## 2017-06-22 DIAGNOSIS — K219 Gastro-esophageal reflux disease without esophagitis: Secondary | ICD-10-CM | POA: Diagnosis not present

## 2017-06-22 DIAGNOSIS — E039 Hypothyroidism, unspecified: Secondary | ICD-10-CM | POA: Diagnosis not present

## 2017-06-22 DIAGNOSIS — I5030 Unspecified diastolic (congestive) heart failure: Secondary | ICD-10-CM | POA: Diagnosis not present

## 2017-06-22 DIAGNOSIS — N3281 Overactive bladder: Secondary | ICD-10-CM | POA: Diagnosis not present

## 2017-06-22 DIAGNOSIS — M059 Rheumatoid arthritis with rheumatoid factor, unspecified: Secondary | ICD-10-CM | POA: Diagnosis not present

## 2017-06-26 DIAGNOSIS — J189 Pneumonia, unspecified organism: Secondary | ICD-10-CM | POA: Diagnosis not present

## 2017-06-26 DIAGNOSIS — I10 Essential (primary) hypertension: Secondary | ICD-10-CM | POA: Diagnosis not present

## 2017-06-26 DIAGNOSIS — J939 Pneumothorax, unspecified: Secondary | ICD-10-CM | POA: Diagnosis not present

## 2017-06-26 DIAGNOSIS — I5030 Unspecified diastolic (congestive) heart failure: Secondary | ICD-10-CM | POA: Diagnosis not present

## 2017-06-26 DIAGNOSIS — M059 Rheumatoid arthritis with rheumatoid factor, unspecified: Secondary | ICD-10-CM | POA: Diagnosis not present

## 2017-06-26 DIAGNOSIS — F339 Major depressive disorder, recurrent, unspecified: Secondary | ICD-10-CM | POA: Diagnosis not present

## 2017-06-27 DIAGNOSIS — J939 Pneumothorax, unspecified: Secondary | ICD-10-CM | POA: Diagnosis not present

## 2017-06-27 DIAGNOSIS — F339 Major depressive disorder, recurrent, unspecified: Secondary | ICD-10-CM | POA: Diagnosis not present

## 2017-06-27 DIAGNOSIS — J189 Pneumonia, unspecified organism: Secondary | ICD-10-CM | POA: Diagnosis not present

## 2017-06-27 DIAGNOSIS — I5030 Unspecified diastolic (congestive) heart failure: Secondary | ICD-10-CM | POA: Diagnosis not present

## 2017-06-27 DIAGNOSIS — I10 Essential (primary) hypertension: Secondary | ICD-10-CM | POA: Diagnosis not present

## 2017-06-27 DIAGNOSIS — M059 Rheumatoid arthritis with rheumatoid factor, unspecified: Secondary | ICD-10-CM | POA: Diagnosis not present

## 2017-06-28 DIAGNOSIS — I10 Essential (primary) hypertension: Secondary | ICD-10-CM | POA: Diagnosis not present

## 2017-06-28 DIAGNOSIS — M059 Rheumatoid arthritis with rheumatoid factor, unspecified: Secondary | ICD-10-CM | POA: Diagnosis not present

## 2017-06-28 DIAGNOSIS — J939 Pneumothorax, unspecified: Secondary | ICD-10-CM | POA: Diagnosis not present

## 2017-06-28 DIAGNOSIS — J189 Pneumonia, unspecified organism: Secondary | ICD-10-CM | POA: Diagnosis not present

## 2017-06-28 DIAGNOSIS — I5030 Unspecified diastolic (congestive) heart failure: Secondary | ICD-10-CM | POA: Diagnosis not present

## 2017-06-28 DIAGNOSIS — F339 Major depressive disorder, recurrent, unspecified: Secondary | ICD-10-CM | POA: Diagnosis not present

## 2017-07-01 DIAGNOSIS — M059 Rheumatoid arthritis with rheumatoid factor, unspecified: Secondary | ICD-10-CM | POA: Diagnosis not present

## 2017-07-01 DIAGNOSIS — J189 Pneumonia, unspecified organism: Secondary | ICD-10-CM | POA: Diagnosis not present

## 2017-07-01 DIAGNOSIS — F339 Major depressive disorder, recurrent, unspecified: Secondary | ICD-10-CM | POA: Diagnosis not present

## 2017-07-01 DIAGNOSIS — J939 Pneumothorax, unspecified: Secondary | ICD-10-CM | POA: Diagnosis not present

## 2017-07-01 DIAGNOSIS — I5030 Unspecified diastolic (congestive) heart failure: Secondary | ICD-10-CM | POA: Diagnosis not present

## 2017-07-01 DIAGNOSIS — I10 Essential (primary) hypertension: Secondary | ICD-10-CM | POA: Diagnosis not present

## 2017-07-03 DIAGNOSIS — J189 Pneumonia, unspecified organism: Secondary | ICD-10-CM | POA: Diagnosis not present

## 2017-07-03 DIAGNOSIS — M059 Rheumatoid arthritis with rheumatoid factor, unspecified: Secondary | ICD-10-CM | POA: Diagnosis not present

## 2017-07-03 DIAGNOSIS — J939 Pneumothorax, unspecified: Secondary | ICD-10-CM | POA: Diagnosis not present

## 2017-07-03 DIAGNOSIS — F339 Major depressive disorder, recurrent, unspecified: Secondary | ICD-10-CM | POA: Diagnosis not present

## 2017-07-03 DIAGNOSIS — I5030 Unspecified diastolic (congestive) heart failure: Secondary | ICD-10-CM | POA: Diagnosis not present

## 2017-07-03 DIAGNOSIS — I10 Essential (primary) hypertension: Secondary | ICD-10-CM | POA: Diagnosis not present

## 2017-07-05 DIAGNOSIS — I5030 Unspecified diastolic (congestive) heart failure: Secondary | ICD-10-CM | POA: Diagnosis not present

## 2017-07-05 DIAGNOSIS — M059 Rheumatoid arthritis with rheumatoid factor, unspecified: Secondary | ICD-10-CM | POA: Diagnosis not present

## 2017-07-05 DIAGNOSIS — I10 Essential (primary) hypertension: Secondary | ICD-10-CM | POA: Diagnosis not present

## 2017-07-05 DIAGNOSIS — F339 Major depressive disorder, recurrent, unspecified: Secondary | ICD-10-CM | POA: Diagnosis not present

## 2017-07-05 DIAGNOSIS — J939 Pneumothorax, unspecified: Secondary | ICD-10-CM | POA: Diagnosis not present

## 2017-07-05 DIAGNOSIS — J189 Pneumonia, unspecified organism: Secondary | ICD-10-CM | POA: Diagnosis not present

## 2017-07-08 DIAGNOSIS — I10 Essential (primary) hypertension: Secondary | ICD-10-CM | POA: Diagnosis not present

## 2017-07-08 DIAGNOSIS — M059 Rheumatoid arthritis with rheumatoid factor, unspecified: Secondary | ICD-10-CM | POA: Diagnosis not present

## 2017-07-08 DIAGNOSIS — J939 Pneumothorax, unspecified: Secondary | ICD-10-CM | POA: Diagnosis not present

## 2017-07-08 DIAGNOSIS — I5030 Unspecified diastolic (congestive) heart failure: Secondary | ICD-10-CM | POA: Diagnosis not present

## 2017-07-08 DIAGNOSIS — J189 Pneumonia, unspecified organism: Secondary | ICD-10-CM | POA: Diagnosis not present

## 2017-07-08 DIAGNOSIS — F339 Major depressive disorder, recurrent, unspecified: Secondary | ICD-10-CM | POA: Diagnosis not present

## 2017-07-10 DIAGNOSIS — I10 Essential (primary) hypertension: Secondary | ICD-10-CM | POA: Diagnosis not present

## 2017-07-10 DIAGNOSIS — I5030 Unspecified diastolic (congestive) heart failure: Secondary | ICD-10-CM | POA: Diagnosis not present

## 2017-07-10 DIAGNOSIS — J189 Pneumonia, unspecified organism: Secondary | ICD-10-CM | POA: Diagnosis not present

## 2017-07-10 DIAGNOSIS — F339 Major depressive disorder, recurrent, unspecified: Secondary | ICD-10-CM | POA: Diagnosis not present

## 2017-07-10 DIAGNOSIS — J939 Pneumothorax, unspecified: Secondary | ICD-10-CM | POA: Diagnosis not present

## 2017-07-10 DIAGNOSIS — M059 Rheumatoid arthritis with rheumatoid factor, unspecified: Secondary | ICD-10-CM | POA: Diagnosis not present

## 2017-07-12 DIAGNOSIS — F339 Major depressive disorder, recurrent, unspecified: Secondary | ICD-10-CM | POA: Diagnosis not present

## 2017-07-12 DIAGNOSIS — I5030 Unspecified diastolic (congestive) heart failure: Secondary | ICD-10-CM | POA: Diagnosis not present

## 2017-07-12 DIAGNOSIS — M059 Rheumatoid arthritis with rheumatoid factor, unspecified: Secondary | ICD-10-CM | POA: Diagnosis not present

## 2017-07-12 DIAGNOSIS — J939 Pneumothorax, unspecified: Secondary | ICD-10-CM | POA: Diagnosis not present

## 2017-07-12 DIAGNOSIS — I10 Essential (primary) hypertension: Secondary | ICD-10-CM | POA: Diagnosis not present

## 2017-07-12 DIAGNOSIS — J189 Pneumonia, unspecified organism: Secondary | ICD-10-CM | POA: Diagnosis not present

## 2017-07-16 DIAGNOSIS — F339 Major depressive disorder, recurrent, unspecified: Secondary | ICD-10-CM | POA: Diagnosis not present

## 2017-07-16 DIAGNOSIS — M059 Rheumatoid arthritis with rheumatoid factor, unspecified: Secondary | ICD-10-CM | POA: Diagnosis not present

## 2017-07-16 DIAGNOSIS — J939 Pneumothorax, unspecified: Secondary | ICD-10-CM | POA: Diagnosis not present

## 2017-07-16 DIAGNOSIS — I5030 Unspecified diastolic (congestive) heart failure: Secondary | ICD-10-CM | POA: Diagnosis not present

## 2017-07-16 DIAGNOSIS — I10 Essential (primary) hypertension: Secondary | ICD-10-CM | POA: Diagnosis not present

## 2017-07-16 DIAGNOSIS — J189 Pneumonia, unspecified organism: Secondary | ICD-10-CM | POA: Diagnosis not present

## 2017-07-17 DIAGNOSIS — M059 Rheumatoid arthritis with rheumatoid factor, unspecified: Secondary | ICD-10-CM | POA: Diagnosis not present

## 2017-07-17 DIAGNOSIS — F339 Major depressive disorder, recurrent, unspecified: Secondary | ICD-10-CM | POA: Diagnosis not present

## 2017-07-17 DIAGNOSIS — J189 Pneumonia, unspecified organism: Secondary | ICD-10-CM | POA: Diagnosis not present

## 2017-07-17 DIAGNOSIS — I10 Essential (primary) hypertension: Secondary | ICD-10-CM | POA: Diagnosis not present

## 2017-07-17 DIAGNOSIS — I5030 Unspecified diastolic (congestive) heart failure: Secondary | ICD-10-CM | POA: Diagnosis not present

## 2017-07-17 DIAGNOSIS — J939 Pneumothorax, unspecified: Secondary | ICD-10-CM | POA: Diagnosis not present

## 2017-07-19 DIAGNOSIS — M059 Rheumatoid arthritis with rheumatoid factor, unspecified: Secondary | ICD-10-CM | POA: Diagnosis not present

## 2017-07-19 DIAGNOSIS — I5030 Unspecified diastolic (congestive) heart failure: Secondary | ICD-10-CM | POA: Diagnosis not present

## 2017-07-19 DIAGNOSIS — F339 Major depressive disorder, recurrent, unspecified: Secondary | ICD-10-CM | POA: Diagnosis not present

## 2017-07-19 DIAGNOSIS — J189 Pneumonia, unspecified organism: Secondary | ICD-10-CM | POA: Diagnosis not present

## 2017-07-19 DIAGNOSIS — I10 Essential (primary) hypertension: Secondary | ICD-10-CM | POA: Diagnosis not present

## 2017-07-19 DIAGNOSIS — J939 Pneumothorax, unspecified: Secondary | ICD-10-CM | POA: Diagnosis not present

## 2017-07-22 DIAGNOSIS — F339 Major depressive disorder, recurrent, unspecified: Secondary | ICD-10-CM | POA: Diagnosis not present

## 2017-07-22 DIAGNOSIS — I5030 Unspecified diastolic (congestive) heart failure: Secondary | ICD-10-CM | POA: Diagnosis not present

## 2017-07-22 DIAGNOSIS — K219 Gastro-esophageal reflux disease without esophagitis: Secondary | ICD-10-CM | POA: Diagnosis not present

## 2017-07-22 DIAGNOSIS — M059 Rheumatoid arthritis with rheumatoid factor, unspecified: Secondary | ICD-10-CM | POA: Diagnosis not present

## 2017-07-22 DIAGNOSIS — J189 Pneumonia, unspecified organism: Secondary | ICD-10-CM | POA: Diagnosis not present

## 2017-07-22 DIAGNOSIS — J939 Pneumothorax, unspecified: Secondary | ICD-10-CM | POA: Diagnosis not present

## 2017-07-22 DIAGNOSIS — I10 Essential (primary) hypertension: Secondary | ICD-10-CM | POA: Diagnosis not present

## 2017-07-22 DIAGNOSIS — E039 Hypothyroidism, unspecified: Secondary | ICD-10-CM | POA: Diagnosis not present

## 2017-07-22 DIAGNOSIS — N3281 Overactive bladder: Secondary | ICD-10-CM | POA: Diagnosis not present

## 2017-07-23 DIAGNOSIS — J939 Pneumothorax, unspecified: Secondary | ICD-10-CM | POA: Diagnosis not present

## 2017-07-23 DIAGNOSIS — I5030 Unspecified diastolic (congestive) heart failure: Secondary | ICD-10-CM | POA: Diagnosis not present

## 2017-07-23 DIAGNOSIS — I10 Essential (primary) hypertension: Secondary | ICD-10-CM | POA: Diagnosis not present

## 2017-07-23 DIAGNOSIS — F339 Major depressive disorder, recurrent, unspecified: Secondary | ICD-10-CM | POA: Diagnosis not present

## 2017-07-23 DIAGNOSIS — M059 Rheumatoid arthritis with rheumatoid factor, unspecified: Secondary | ICD-10-CM | POA: Diagnosis not present

## 2017-07-23 DIAGNOSIS — J189 Pneumonia, unspecified organism: Secondary | ICD-10-CM | POA: Diagnosis not present

## 2017-07-24 DIAGNOSIS — I10 Essential (primary) hypertension: Secondary | ICD-10-CM | POA: Diagnosis not present

## 2017-07-24 DIAGNOSIS — F339 Major depressive disorder, recurrent, unspecified: Secondary | ICD-10-CM | POA: Diagnosis not present

## 2017-07-24 DIAGNOSIS — I5030 Unspecified diastolic (congestive) heart failure: Secondary | ICD-10-CM | POA: Diagnosis not present

## 2017-07-24 DIAGNOSIS — J189 Pneumonia, unspecified organism: Secondary | ICD-10-CM | POA: Diagnosis not present

## 2017-07-24 DIAGNOSIS — M059 Rheumatoid arthritis with rheumatoid factor, unspecified: Secondary | ICD-10-CM | POA: Diagnosis not present

## 2017-07-24 DIAGNOSIS — J939 Pneumothorax, unspecified: Secondary | ICD-10-CM | POA: Diagnosis not present

## 2017-07-26 DIAGNOSIS — F339 Major depressive disorder, recurrent, unspecified: Secondary | ICD-10-CM | POA: Diagnosis not present

## 2017-07-26 DIAGNOSIS — I5030 Unspecified diastolic (congestive) heart failure: Secondary | ICD-10-CM | POA: Diagnosis not present

## 2017-07-26 DIAGNOSIS — J939 Pneumothorax, unspecified: Secondary | ICD-10-CM | POA: Diagnosis not present

## 2017-07-26 DIAGNOSIS — I10 Essential (primary) hypertension: Secondary | ICD-10-CM | POA: Diagnosis not present

## 2017-07-26 DIAGNOSIS — J189 Pneumonia, unspecified organism: Secondary | ICD-10-CM | POA: Diagnosis not present

## 2017-07-26 DIAGNOSIS — M059 Rheumatoid arthritis with rheumatoid factor, unspecified: Secondary | ICD-10-CM | POA: Diagnosis not present

## 2017-07-31 DIAGNOSIS — I5030 Unspecified diastolic (congestive) heart failure: Secondary | ICD-10-CM | POA: Diagnosis not present

## 2017-07-31 DIAGNOSIS — J939 Pneumothorax, unspecified: Secondary | ICD-10-CM | POA: Diagnosis not present

## 2017-07-31 DIAGNOSIS — M059 Rheumatoid arthritis with rheumatoid factor, unspecified: Secondary | ICD-10-CM | POA: Diagnosis not present

## 2017-07-31 DIAGNOSIS — I10 Essential (primary) hypertension: Secondary | ICD-10-CM | POA: Diagnosis not present

## 2017-07-31 DIAGNOSIS — F339 Major depressive disorder, recurrent, unspecified: Secondary | ICD-10-CM | POA: Diagnosis not present

## 2017-07-31 DIAGNOSIS — J189 Pneumonia, unspecified organism: Secondary | ICD-10-CM | POA: Diagnosis not present

## 2017-08-01 DIAGNOSIS — I10 Essential (primary) hypertension: Secondary | ICD-10-CM | POA: Diagnosis not present

## 2017-08-01 DIAGNOSIS — I5030 Unspecified diastolic (congestive) heart failure: Secondary | ICD-10-CM | POA: Diagnosis not present

## 2017-08-01 DIAGNOSIS — F339 Major depressive disorder, recurrent, unspecified: Secondary | ICD-10-CM | POA: Diagnosis not present

## 2017-08-01 DIAGNOSIS — J939 Pneumothorax, unspecified: Secondary | ICD-10-CM | POA: Diagnosis not present

## 2017-08-01 DIAGNOSIS — J189 Pneumonia, unspecified organism: Secondary | ICD-10-CM | POA: Diagnosis not present

## 2017-08-01 DIAGNOSIS — M059 Rheumatoid arthritis with rheumatoid factor, unspecified: Secondary | ICD-10-CM | POA: Diagnosis not present

## 2017-08-06 DIAGNOSIS — M059 Rheumatoid arthritis with rheumatoid factor, unspecified: Secondary | ICD-10-CM | POA: Diagnosis not present

## 2017-08-06 DIAGNOSIS — F339 Major depressive disorder, recurrent, unspecified: Secondary | ICD-10-CM | POA: Diagnosis not present

## 2017-08-06 DIAGNOSIS — J189 Pneumonia, unspecified organism: Secondary | ICD-10-CM | POA: Diagnosis not present

## 2017-08-06 DIAGNOSIS — I10 Essential (primary) hypertension: Secondary | ICD-10-CM | POA: Diagnosis not present

## 2017-08-06 DIAGNOSIS — I5030 Unspecified diastolic (congestive) heart failure: Secondary | ICD-10-CM | POA: Diagnosis not present

## 2017-08-06 DIAGNOSIS — J939 Pneumothorax, unspecified: Secondary | ICD-10-CM | POA: Diagnosis not present

## 2017-08-07 DIAGNOSIS — I5030 Unspecified diastolic (congestive) heart failure: Secondary | ICD-10-CM | POA: Diagnosis not present

## 2017-08-07 DIAGNOSIS — J189 Pneumonia, unspecified organism: Secondary | ICD-10-CM | POA: Diagnosis not present

## 2017-08-07 DIAGNOSIS — F339 Major depressive disorder, recurrent, unspecified: Secondary | ICD-10-CM | POA: Diagnosis not present

## 2017-08-07 DIAGNOSIS — J939 Pneumothorax, unspecified: Secondary | ICD-10-CM | POA: Diagnosis not present

## 2017-08-07 DIAGNOSIS — I10 Essential (primary) hypertension: Secondary | ICD-10-CM | POA: Diagnosis not present

## 2017-08-07 DIAGNOSIS — M059 Rheumatoid arthritis with rheumatoid factor, unspecified: Secondary | ICD-10-CM | POA: Diagnosis not present

## 2017-08-08 DIAGNOSIS — I5030 Unspecified diastolic (congestive) heart failure: Secondary | ICD-10-CM | POA: Diagnosis not present

## 2017-08-08 DIAGNOSIS — J939 Pneumothorax, unspecified: Secondary | ICD-10-CM | POA: Diagnosis not present

## 2017-08-08 DIAGNOSIS — M059 Rheumatoid arthritis with rheumatoid factor, unspecified: Secondary | ICD-10-CM | POA: Diagnosis not present

## 2017-08-08 DIAGNOSIS — J189 Pneumonia, unspecified organism: Secondary | ICD-10-CM | POA: Diagnosis not present

## 2017-08-08 DIAGNOSIS — I10 Essential (primary) hypertension: Secondary | ICD-10-CM | POA: Diagnosis not present

## 2017-08-08 DIAGNOSIS — F339 Major depressive disorder, recurrent, unspecified: Secondary | ICD-10-CM | POA: Diagnosis not present

## 2017-08-14 DIAGNOSIS — F339 Major depressive disorder, recurrent, unspecified: Secondary | ICD-10-CM | POA: Diagnosis not present

## 2017-08-14 DIAGNOSIS — J939 Pneumothorax, unspecified: Secondary | ICD-10-CM | POA: Diagnosis not present

## 2017-08-14 DIAGNOSIS — I10 Essential (primary) hypertension: Secondary | ICD-10-CM | POA: Diagnosis not present

## 2017-08-14 DIAGNOSIS — M059 Rheumatoid arthritis with rheumatoid factor, unspecified: Secondary | ICD-10-CM | POA: Diagnosis not present

## 2017-08-14 DIAGNOSIS — I5030 Unspecified diastolic (congestive) heart failure: Secondary | ICD-10-CM | POA: Diagnosis not present

## 2017-08-14 DIAGNOSIS — J189 Pneumonia, unspecified organism: Secondary | ICD-10-CM | POA: Diagnosis not present

## 2017-08-16 DIAGNOSIS — F339 Major depressive disorder, recurrent, unspecified: Secondary | ICD-10-CM | POA: Diagnosis not present

## 2017-08-16 DIAGNOSIS — J939 Pneumothorax, unspecified: Secondary | ICD-10-CM | POA: Diagnosis not present

## 2017-08-16 DIAGNOSIS — I5030 Unspecified diastolic (congestive) heart failure: Secondary | ICD-10-CM | POA: Diagnosis not present

## 2017-08-16 DIAGNOSIS — I10 Essential (primary) hypertension: Secondary | ICD-10-CM | POA: Diagnosis not present

## 2017-08-16 DIAGNOSIS — M059 Rheumatoid arthritis with rheumatoid factor, unspecified: Secondary | ICD-10-CM | POA: Diagnosis not present

## 2017-08-16 DIAGNOSIS — J189 Pneumonia, unspecified organism: Secondary | ICD-10-CM | POA: Diagnosis not present

## 2017-08-21 DIAGNOSIS — M059 Rheumatoid arthritis with rheumatoid factor, unspecified: Secondary | ICD-10-CM | POA: Diagnosis not present

## 2017-08-21 DIAGNOSIS — F339 Major depressive disorder, recurrent, unspecified: Secondary | ICD-10-CM | POA: Diagnosis not present

## 2017-08-21 DIAGNOSIS — J939 Pneumothorax, unspecified: Secondary | ICD-10-CM | POA: Diagnosis not present

## 2017-08-21 DIAGNOSIS — I10 Essential (primary) hypertension: Secondary | ICD-10-CM | POA: Diagnosis not present

## 2017-08-21 DIAGNOSIS — J189 Pneumonia, unspecified organism: Secondary | ICD-10-CM | POA: Diagnosis not present

## 2017-08-21 DIAGNOSIS — I5030 Unspecified diastolic (congestive) heart failure: Secondary | ICD-10-CM | POA: Diagnosis not present

## 2017-08-22 DIAGNOSIS — J189 Pneumonia, unspecified organism: Secondary | ICD-10-CM | POA: Diagnosis not present

## 2017-08-22 DIAGNOSIS — M059 Rheumatoid arthritis with rheumatoid factor, unspecified: Secondary | ICD-10-CM | POA: Diagnosis not present

## 2017-08-22 DIAGNOSIS — I5030 Unspecified diastolic (congestive) heart failure: Secondary | ICD-10-CM | POA: Diagnosis not present

## 2017-08-22 DIAGNOSIS — N3281 Overactive bladder: Secondary | ICD-10-CM | POA: Diagnosis not present

## 2017-08-22 DIAGNOSIS — K219 Gastro-esophageal reflux disease without esophagitis: Secondary | ICD-10-CM | POA: Diagnosis not present

## 2017-08-22 DIAGNOSIS — I10 Essential (primary) hypertension: Secondary | ICD-10-CM | POA: Diagnosis not present

## 2017-08-22 DIAGNOSIS — E039 Hypothyroidism, unspecified: Secondary | ICD-10-CM | POA: Diagnosis not present

## 2017-08-22 DIAGNOSIS — J939 Pneumothorax, unspecified: Secondary | ICD-10-CM | POA: Diagnosis not present

## 2017-08-22 DIAGNOSIS — F339 Major depressive disorder, recurrent, unspecified: Secondary | ICD-10-CM | POA: Diagnosis not present

## 2017-08-23 DIAGNOSIS — M059 Rheumatoid arthritis with rheumatoid factor, unspecified: Secondary | ICD-10-CM | POA: Diagnosis not present

## 2017-08-23 DIAGNOSIS — J189 Pneumonia, unspecified organism: Secondary | ICD-10-CM | POA: Diagnosis not present

## 2017-08-23 DIAGNOSIS — J939 Pneumothorax, unspecified: Secondary | ICD-10-CM | POA: Diagnosis not present

## 2017-08-23 DIAGNOSIS — F339 Major depressive disorder, recurrent, unspecified: Secondary | ICD-10-CM | POA: Diagnosis not present

## 2017-08-23 DIAGNOSIS — I10 Essential (primary) hypertension: Secondary | ICD-10-CM | POA: Diagnosis not present

## 2017-08-23 DIAGNOSIS — I5030 Unspecified diastolic (congestive) heart failure: Secondary | ICD-10-CM | POA: Diagnosis not present

## 2017-08-27 DIAGNOSIS — J939 Pneumothorax, unspecified: Secondary | ICD-10-CM | POA: Diagnosis not present

## 2017-08-27 DIAGNOSIS — F339 Major depressive disorder, recurrent, unspecified: Secondary | ICD-10-CM | POA: Diagnosis not present

## 2017-08-27 DIAGNOSIS — I5030 Unspecified diastolic (congestive) heart failure: Secondary | ICD-10-CM | POA: Diagnosis not present

## 2017-08-27 DIAGNOSIS — M059 Rheumatoid arthritis with rheumatoid factor, unspecified: Secondary | ICD-10-CM | POA: Diagnosis not present

## 2017-08-27 DIAGNOSIS — I10 Essential (primary) hypertension: Secondary | ICD-10-CM | POA: Diagnosis not present

## 2017-08-27 DIAGNOSIS — J189 Pneumonia, unspecified organism: Secondary | ICD-10-CM | POA: Diagnosis not present

## 2017-08-28 DIAGNOSIS — J189 Pneumonia, unspecified organism: Secondary | ICD-10-CM | POA: Diagnosis not present

## 2017-08-28 DIAGNOSIS — F339 Major depressive disorder, recurrent, unspecified: Secondary | ICD-10-CM | POA: Diagnosis not present

## 2017-08-28 DIAGNOSIS — M059 Rheumatoid arthritis with rheumatoid factor, unspecified: Secondary | ICD-10-CM | POA: Diagnosis not present

## 2017-08-28 DIAGNOSIS — I10 Essential (primary) hypertension: Secondary | ICD-10-CM | POA: Diagnosis not present

## 2017-08-28 DIAGNOSIS — J939 Pneumothorax, unspecified: Secondary | ICD-10-CM | POA: Diagnosis not present

## 2017-08-28 DIAGNOSIS — I5030 Unspecified diastolic (congestive) heart failure: Secondary | ICD-10-CM | POA: Diagnosis not present

## 2017-08-29 DIAGNOSIS — I10 Essential (primary) hypertension: Secondary | ICD-10-CM | POA: Diagnosis not present

## 2017-08-29 DIAGNOSIS — M059 Rheumatoid arthritis with rheumatoid factor, unspecified: Secondary | ICD-10-CM | POA: Diagnosis not present

## 2017-08-29 DIAGNOSIS — J189 Pneumonia, unspecified organism: Secondary | ICD-10-CM | POA: Diagnosis not present

## 2017-08-29 DIAGNOSIS — I5030 Unspecified diastolic (congestive) heart failure: Secondary | ICD-10-CM | POA: Diagnosis not present

## 2017-08-29 DIAGNOSIS — F339 Major depressive disorder, recurrent, unspecified: Secondary | ICD-10-CM | POA: Diagnosis not present

## 2017-08-29 DIAGNOSIS — J939 Pneumothorax, unspecified: Secondary | ICD-10-CM | POA: Diagnosis not present

## 2017-08-30 DIAGNOSIS — J189 Pneumonia, unspecified organism: Secondary | ICD-10-CM | POA: Diagnosis not present

## 2017-08-30 DIAGNOSIS — I10 Essential (primary) hypertension: Secondary | ICD-10-CM | POA: Diagnosis not present

## 2017-08-30 DIAGNOSIS — F339 Major depressive disorder, recurrent, unspecified: Secondary | ICD-10-CM | POA: Diagnosis not present

## 2017-08-30 DIAGNOSIS — M059 Rheumatoid arthritis with rheumatoid factor, unspecified: Secondary | ICD-10-CM | POA: Diagnosis not present

## 2017-08-30 DIAGNOSIS — J939 Pneumothorax, unspecified: Secondary | ICD-10-CM | POA: Diagnosis not present

## 2017-08-30 DIAGNOSIS — I5030 Unspecified diastolic (congestive) heart failure: Secondary | ICD-10-CM | POA: Diagnosis not present

## 2017-09-03 DIAGNOSIS — I5030 Unspecified diastolic (congestive) heart failure: Secondary | ICD-10-CM | POA: Diagnosis not present

## 2017-09-03 DIAGNOSIS — F339 Major depressive disorder, recurrent, unspecified: Secondary | ICD-10-CM | POA: Diagnosis not present

## 2017-09-03 DIAGNOSIS — J189 Pneumonia, unspecified organism: Secondary | ICD-10-CM | POA: Diagnosis not present

## 2017-09-03 DIAGNOSIS — J939 Pneumothorax, unspecified: Secondary | ICD-10-CM | POA: Diagnosis not present

## 2017-09-03 DIAGNOSIS — I10 Essential (primary) hypertension: Secondary | ICD-10-CM | POA: Diagnosis not present

## 2017-09-03 DIAGNOSIS — M059 Rheumatoid arthritis with rheumatoid factor, unspecified: Secondary | ICD-10-CM | POA: Diagnosis not present

## 2017-09-04 DIAGNOSIS — J189 Pneumonia, unspecified organism: Secondary | ICD-10-CM | POA: Diagnosis not present

## 2017-09-04 DIAGNOSIS — M059 Rheumatoid arthritis with rheumatoid factor, unspecified: Secondary | ICD-10-CM | POA: Diagnosis not present

## 2017-09-04 DIAGNOSIS — I10 Essential (primary) hypertension: Secondary | ICD-10-CM | POA: Diagnosis not present

## 2017-09-04 DIAGNOSIS — I5030 Unspecified diastolic (congestive) heart failure: Secondary | ICD-10-CM | POA: Diagnosis not present

## 2017-09-04 DIAGNOSIS — F339 Major depressive disorder, recurrent, unspecified: Secondary | ICD-10-CM | POA: Diagnosis not present

## 2017-09-04 DIAGNOSIS — J939 Pneumothorax, unspecified: Secondary | ICD-10-CM | POA: Diagnosis not present

## 2017-09-06 DIAGNOSIS — I5030 Unspecified diastolic (congestive) heart failure: Secondary | ICD-10-CM | POA: Diagnosis not present

## 2017-09-06 DIAGNOSIS — J939 Pneumothorax, unspecified: Secondary | ICD-10-CM | POA: Diagnosis not present

## 2017-09-06 DIAGNOSIS — M059 Rheumatoid arthritis with rheumatoid factor, unspecified: Secondary | ICD-10-CM | POA: Diagnosis not present

## 2017-09-06 DIAGNOSIS — F339 Major depressive disorder, recurrent, unspecified: Secondary | ICD-10-CM | POA: Diagnosis not present

## 2017-09-06 DIAGNOSIS — J189 Pneumonia, unspecified organism: Secondary | ICD-10-CM | POA: Diagnosis not present

## 2017-09-06 DIAGNOSIS — I10 Essential (primary) hypertension: Secondary | ICD-10-CM | POA: Diagnosis not present

## 2017-09-11 DIAGNOSIS — M059 Rheumatoid arthritis with rheumatoid factor, unspecified: Secondary | ICD-10-CM | POA: Diagnosis not present

## 2017-09-11 DIAGNOSIS — J939 Pneumothorax, unspecified: Secondary | ICD-10-CM | POA: Diagnosis not present

## 2017-09-11 DIAGNOSIS — F339 Major depressive disorder, recurrent, unspecified: Secondary | ICD-10-CM | POA: Diagnosis not present

## 2017-09-11 DIAGNOSIS — I10 Essential (primary) hypertension: Secondary | ICD-10-CM | POA: Diagnosis not present

## 2017-09-11 DIAGNOSIS — J189 Pneumonia, unspecified organism: Secondary | ICD-10-CM | POA: Diagnosis not present

## 2017-09-11 DIAGNOSIS — I5030 Unspecified diastolic (congestive) heart failure: Secondary | ICD-10-CM | POA: Diagnosis not present

## 2017-09-13 DIAGNOSIS — I10 Essential (primary) hypertension: Secondary | ICD-10-CM | POA: Diagnosis not present

## 2017-09-13 DIAGNOSIS — F339 Major depressive disorder, recurrent, unspecified: Secondary | ICD-10-CM | POA: Diagnosis not present

## 2017-09-13 DIAGNOSIS — J939 Pneumothorax, unspecified: Secondary | ICD-10-CM | POA: Diagnosis not present

## 2017-09-13 DIAGNOSIS — M059 Rheumatoid arthritis with rheumatoid factor, unspecified: Secondary | ICD-10-CM | POA: Diagnosis not present

## 2017-09-13 DIAGNOSIS — I5030 Unspecified diastolic (congestive) heart failure: Secondary | ICD-10-CM | POA: Diagnosis not present

## 2017-09-13 DIAGNOSIS — J189 Pneumonia, unspecified organism: Secondary | ICD-10-CM | POA: Diagnosis not present

## 2017-09-15 DIAGNOSIS — J189 Pneumonia, unspecified organism: Secondary | ICD-10-CM | POA: Diagnosis not present

## 2017-09-15 DIAGNOSIS — J939 Pneumothorax, unspecified: Secondary | ICD-10-CM | POA: Diagnosis not present

## 2017-09-15 DIAGNOSIS — I5030 Unspecified diastolic (congestive) heart failure: Secondary | ICD-10-CM | POA: Diagnosis not present

## 2017-09-15 DIAGNOSIS — F339 Major depressive disorder, recurrent, unspecified: Secondary | ICD-10-CM | POA: Diagnosis not present

## 2017-09-15 DIAGNOSIS — M059 Rheumatoid arthritis with rheumatoid factor, unspecified: Secondary | ICD-10-CM | POA: Diagnosis not present

## 2017-09-15 DIAGNOSIS — I10 Essential (primary) hypertension: Secondary | ICD-10-CM | POA: Diagnosis not present

## 2017-09-17 DIAGNOSIS — I5030 Unspecified diastolic (congestive) heart failure: Secondary | ICD-10-CM | POA: Diagnosis not present

## 2017-09-17 DIAGNOSIS — I10 Essential (primary) hypertension: Secondary | ICD-10-CM | POA: Diagnosis not present

## 2017-09-17 DIAGNOSIS — J939 Pneumothorax, unspecified: Secondary | ICD-10-CM | POA: Diagnosis not present

## 2017-09-17 DIAGNOSIS — J189 Pneumonia, unspecified organism: Secondary | ICD-10-CM | POA: Diagnosis not present

## 2017-09-17 DIAGNOSIS — M059 Rheumatoid arthritis with rheumatoid factor, unspecified: Secondary | ICD-10-CM | POA: Diagnosis not present

## 2017-09-17 DIAGNOSIS — F339 Major depressive disorder, recurrent, unspecified: Secondary | ICD-10-CM | POA: Diagnosis not present

## 2017-09-20 DIAGNOSIS — I10 Essential (primary) hypertension: Secondary | ICD-10-CM | POA: Diagnosis not present

## 2017-09-20 DIAGNOSIS — J189 Pneumonia, unspecified organism: Secondary | ICD-10-CM | POA: Diagnosis not present

## 2017-09-20 DIAGNOSIS — I5030 Unspecified diastolic (congestive) heart failure: Secondary | ICD-10-CM | POA: Diagnosis not present

## 2017-09-20 DIAGNOSIS — M059 Rheumatoid arthritis with rheumatoid factor, unspecified: Secondary | ICD-10-CM | POA: Diagnosis not present

## 2017-09-20 DIAGNOSIS — J939 Pneumothorax, unspecified: Secondary | ICD-10-CM | POA: Diagnosis not present

## 2017-09-20 DIAGNOSIS — F339 Major depressive disorder, recurrent, unspecified: Secondary | ICD-10-CM | POA: Diagnosis not present

## 2017-09-21 DIAGNOSIS — K219 Gastro-esophageal reflux disease without esophagitis: Secondary | ICD-10-CM | POA: Diagnosis not present

## 2017-09-21 DIAGNOSIS — J189 Pneumonia, unspecified organism: Secondary | ICD-10-CM | POA: Diagnosis not present

## 2017-09-21 DIAGNOSIS — E039 Hypothyroidism, unspecified: Secondary | ICD-10-CM | POA: Diagnosis not present

## 2017-09-21 DIAGNOSIS — J939 Pneumothorax, unspecified: Secondary | ICD-10-CM | POA: Diagnosis not present

## 2017-09-21 DIAGNOSIS — I5030 Unspecified diastolic (congestive) heart failure: Secondary | ICD-10-CM | POA: Diagnosis not present

## 2017-09-21 DIAGNOSIS — N3281 Overactive bladder: Secondary | ICD-10-CM | POA: Diagnosis not present

## 2017-09-21 DIAGNOSIS — F339 Major depressive disorder, recurrent, unspecified: Secondary | ICD-10-CM | POA: Diagnosis not present

## 2017-09-21 DIAGNOSIS — M059 Rheumatoid arthritis with rheumatoid factor, unspecified: Secondary | ICD-10-CM | POA: Diagnosis not present

## 2017-09-21 DIAGNOSIS — I10 Essential (primary) hypertension: Secondary | ICD-10-CM | POA: Diagnosis not present

## 2017-09-25 DIAGNOSIS — M059 Rheumatoid arthritis with rheumatoid factor, unspecified: Secondary | ICD-10-CM | POA: Diagnosis not present

## 2017-09-25 DIAGNOSIS — I10 Essential (primary) hypertension: Secondary | ICD-10-CM | POA: Diagnosis not present

## 2017-09-25 DIAGNOSIS — J189 Pneumonia, unspecified organism: Secondary | ICD-10-CM | POA: Diagnosis not present

## 2017-09-25 DIAGNOSIS — I5030 Unspecified diastolic (congestive) heart failure: Secondary | ICD-10-CM | POA: Diagnosis not present

## 2017-09-25 DIAGNOSIS — F339 Major depressive disorder, recurrent, unspecified: Secondary | ICD-10-CM | POA: Diagnosis not present

## 2017-09-25 DIAGNOSIS — J939 Pneumothorax, unspecified: Secondary | ICD-10-CM | POA: Diagnosis not present

## 2017-09-26 DIAGNOSIS — M059 Rheumatoid arthritis with rheumatoid factor, unspecified: Secondary | ICD-10-CM | POA: Diagnosis not present

## 2017-09-26 DIAGNOSIS — J189 Pneumonia, unspecified organism: Secondary | ICD-10-CM | POA: Diagnosis not present

## 2017-09-26 DIAGNOSIS — J939 Pneumothorax, unspecified: Secondary | ICD-10-CM | POA: Diagnosis not present

## 2017-09-26 DIAGNOSIS — F339 Major depressive disorder, recurrent, unspecified: Secondary | ICD-10-CM | POA: Diagnosis not present

## 2017-09-26 DIAGNOSIS — I10 Essential (primary) hypertension: Secondary | ICD-10-CM | POA: Diagnosis not present

## 2017-09-26 DIAGNOSIS — I5030 Unspecified diastolic (congestive) heart failure: Secondary | ICD-10-CM | POA: Diagnosis not present

## 2017-09-27 DIAGNOSIS — I5030 Unspecified diastolic (congestive) heart failure: Secondary | ICD-10-CM | POA: Diagnosis not present

## 2017-09-27 DIAGNOSIS — F339 Major depressive disorder, recurrent, unspecified: Secondary | ICD-10-CM | POA: Diagnosis not present

## 2017-09-27 DIAGNOSIS — J939 Pneumothorax, unspecified: Secondary | ICD-10-CM | POA: Diagnosis not present

## 2017-09-27 DIAGNOSIS — I10 Essential (primary) hypertension: Secondary | ICD-10-CM | POA: Diagnosis not present

## 2017-09-27 DIAGNOSIS — J189 Pneumonia, unspecified organism: Secondary | ICD-10-CM | POA: Diagnosis not present

## 2017-09-27 DIAGNOSIS — M059 Rheumatoid arthritis with rheumatoid factor, unspecified: Secondary | ICD-10-CM | POA: Diagnosis not present

## 2017-10-02 DIAGNOSIS — F339 Major depressive disorder, recurrent, unspecified: Secondary | ICD-10-CM | POA: Diagnosis not present

## 2017-10-02 DIAGNOSIS — M059 Rheumatoid arthritis with rheumatoid factor, unspecified: Secondary | ICD-10-CM | POA: Diagnosis not present

## 2017-10-02 DIAGNOSIS — J939 Pneumothorax, unspecified: Secondary | ICD-10-CM | POA: Diagnosis not present

## 2017-10-02 DIAGNOSIS — I10 Essential (primary) hypertension: Secondary | ICD-10-CM | POA: Diagnosis not present

## 2017-10-02 DIAGNOSIS — I5030 Unspecified diastolic (congestive) heart failure: Secondary | ICD-10-CM | POA: Diagnosis not present

## 2017-10-02 DIAGNOSIS — J189 Pneumonia, unspecified organism: Secondary | ICD-10-CM | POA: Diagnosis not present

## 2017-10-04 DIAGNOSIS — J939 Pneumothorax, unspecified: Secondary | ICD-10-CM | POA: Diagnosis not present

## 2017-10-04 DIAGNOSIS — M059 Rheumatoid arthritis with rheumatoid factor, unspecified: Secondary | ICD-10-CM | POA: Diagnosis not present

## 2017-10-04 DIAGNOSIS — F339 Major depressive disorder, recurrent, unspecified: Secondary | ICD-10-CM | POA: Diagnosis not present

## 2017-10-04 DIAGNOSIS — J189 Pneumonia, unspecified organism: Secondary | ICD-10-CM | POA: Diagnosis not present

## 2017-10-04 DIAGNOSIS — I10 Essential (primary) hypertension: Secondary | ICD-10-CM | POA: Diagnosis not present

## 2017-10-04 DIAGNOSIS — I5030 Unspecified diastolic (congestive) heart failure: Secondary | ICD-10-CM | POA: Diagnosis not present

## 2017-10-07 DIAGNOSIS — R300 Dysuria: Secondary | ICD-10-CM | POA: Diagnosis not present

## 2017-10-07 DIAGNOSIS — I10 Essential (primary) hypertension: Secondary | ICD-10-CM | POA: Diagnosis not present

## 2017-10-09 DIAGNOSIS — J939 Pneumothorax, unspecified: Secondary | ICD-10-CM | POA: Diagnosis not present

## 2017-10-09 DIAGNOSIS — I10 Essential (primary) hypertension: Secondary | ICD-10-CM | POA: Diagnosis not present

## 2017-10-09 DIAGNOSIS — M059 Rheumatoid arthritis with rheumatoid factor, unspecified: Secondary | ICD-10-CM | POA: Diagnosis not present

## 2017-10-09 DIAGNOSIS — J189 Pneumonia, unspecified organism: Secondary | ICD-10-CM | POA: Diagnosis not present

## 2017-10-09 DIAGNOSIS — I5030 Unspecified diastolic (congestive) heart failure: Secondary | ICD-10-CM | POA: Diagnosis not present

## 2017-10-09 DIAGNOSIS — F339 Major depressive disorder, recurrent, unspecified: Secondary | ICD-10-CM | POA: Diagnosis not present

## 2017-10-11 DIAGNOSIS — M059 Rheumatoid arthritis with rheumatoid factor, unspecified: Secondary | ICD-10-CM | POA: Diagnosis not present

## 2017-10-11 DIAGNOSIS — F339 Major depressive disorder, recurrent, unspecified: Secondary | ICD-10-CM | POA: Diagnosis not present

## 2017-10-11 DIAGNOSIS — J939 Pneumothorax, unspecified: Secondary | ICD-10-CM | POA: Diagnosis not present

## 2017-10-11 DIAGNOSIS — J189 Pneumonia, unspecified organism: Secondary | ICD-10-CM | POA: Diagnosis not present

## 2017-10-11 DIAGNOSIS — I10 Essential (primary) hypertension: Secondary | ICD-10-CM | POA: Diagnosis not present

## 2017-10-11 DIAGNOSIS — I5030 Unspecified diastolic (congestive) heart failure: Secondary | ICD-10-CM | POA: Diagnosis not present

## 2017-10-16 DIAGNOSIS — F339 Major depressive disorder, recurrent, unspecified: Secondary | ICD-10-CM | POA: Diagnosis not present

## 2017-10-16 DIAGNOSIS — J939 Pneumothorax, unspecified: Secondary | ICD-10-CM | POA: Diagnosis not present

## 2017-10-16 DIAGNOSIS — I10 Essential (primary) hypertension: Secondary | ICD-10-CM | POA: Diagnosis not present

## 2017-10-16 DIAGNOSIS — J189 Pneumonia, unspecified organism: Secondary | ICD-10-CM | POA: Diagnosis not present

## 2017-10-16 DIAGNOSIS — M059 Rheumatoid arthritis with rheumatoid factor, unspecified: Secondary | ICD-10-CM | POA: Diagnosis not present

## 2017-10-16 DIAGNOSIS — I5030 Unspecified diastolic (congestive) heart failure: Secondary | ICD-10-CM | POA: Diagnosis not present

## 2017-10-17 DIAGNOSIS — F339 Major depressive disorder, recurrent, unspecified: Secondary | ICD-10-CM | POA: Diagnosis not present

## 2017-10-17 DIAGNOSIS — I10 Essential (primary) hypertension: Secondary | ICD-10-CM | POA: Diagnosis not present

## 2017-10-17 DIAGNOSIS — I5030 Unspecified diastolic (congestive) heart failure: Secondary | ICD-10-CM | POA: Diagnosis not present

## 2017-10-17 DIAGNOSIS — J189 Pneumonia, unspecified organism: Secondary | ICD-10-CM | POA: Diagnosis not present

## 2017-10-17 DIAGNOSIS — J939 Pneumothorax, unspecified: Secondary | ICD-10-CM | POA: Diagnosis not present

## 2017-10-17 DIAGNOSIS — M059 Rheumatoid arthritis with rheumatoid factor, unspecified: Secondary | ICD-10-CM | POA: Diagnosis not present

## 2017-10-18 DIAGNOSIS — I5030 Unspecified diastolic (congestive) heart failure: Secondary | ICD-10-CM | POA: Diagnosis not present

## 2017-10-18 DIAGNOSIS — J189 Pneumonia, unspecified organism: Secondary | ICD-10-CM | POA: Diagnosis not present

## 2017-10-18 DIAGNOSIS — J939 Pneumothorax, unspecified: Secondary | ICD-10-CM | POA: Diagnosis not present

## 2017-10-18 DIAGNOSIS — I10 Essential (primary) hypertension: Secondary | ICD-10-CM | POA: Diagnosis not present

## 2017-10-18 DIAGNOSIS — M059 Rheumatoid arthritis with rheumatoid factor, unspecified: Secondary | ICD-10-CM | POA: Diagnosis not present

## 2017-10-18 DIAGNOSIS — F339 Major depressive disorder, recurrent, unspecified: Secondary | ICD-10-CM | POA: Diagnosis not present

## 2017-10-22 DIAGNOSIS — F339 Major depressive disorder, recurrent, unspecified: Secondary | ICD-10-CM | POA: Diagnosis not present

## 2017-10-22 DIAGNOSIS — J189 Pneumonia, unspecified organism: Secondary | ICD-10-CM | POA: Diagnosis not present

## 2017-10-22 DIAGNOSIS — E039 Hypothyroidism, unspecified: Secondary | ICD-10-CM | POA: Diagnosis not present

## 2017-10-22 DIAGNOSIS — M059 Rheumatoid arthritis with rheumatoid factor, unspecified: Secondary | ICD-10-CM | POA: Diagnosis not present

## 2017-10-22 DIAGNOSIS — I5030 Unspecified diastolic (congestive) heart failure: Secondary | ICD-10-CM | POA: Diagnosis not present

## 2017-10-22 DIAGNOSIS — K219 Gastro-esophageal reflux disease without esophagitis: Secondary | ICD-10-CM | POA: Diagnosis not present

## 2017-10-22 DIAGNOSIS — N3281 Overactive bladder: Secondary | ICD-10-CM | POA: Diagnosis not present

## 2017-10-22 DIAGNOSIS — J939 Pneumothorax, unspecified: Secondary | ICD-10-CM | POA: Diagnosis not present

## 2017-10-22 DIAGNOSIS — I10 Essential (primary) hypertension: Secondary | ICD-10-CM | POA: Diagnosis not present

## 2017-10-23 DIAGNOSIS — I5030 Unspecified diastolic (congestive) heart failure: Secondary | ICD-10-CM | POA: Diagnosis not present

## 2017-10-23 DIAGNOSIS — J939 Pneumothorax, unspecified: Secondary | ICD-10-CM | POA: Diagnosis not present

## 2017-10-23 DIAGNOSIS — I10 Essential (primary) hypertension: Secondary | ICD-10-CM | POA: Diagnosis not present

## 2017-10-23 DIAGNOSIS — F339 Major depressive disorder, recurrent, unspecified: Secondary | ICD-10-CM | POA: Diagnosis not present

## 2017-10-23 DIAGNOSIS — J189 Pneumonia, unspecified organism: Secondary | ICD-10-CM | POA: Diagnosis not present

## 2017-10-23 DIAGNOSIS — M059 Rheumatoid arthritis with rheumatoid factor, unspecified: Secondary | ICD-10-CM | POA: Diagnosis not present

## 2017-10-25 DIAGNOSIS — F339 Major depressive disorder, recurrent, unspecified: Secondary | ICD-10-CM | POA: Diagnosis not present

## 2017-10-25 DIAGNOSIS — J939 Pneumothorax, unspecified: Secondary | ICD-10-CM | POA: Diagnosis not present

## 2017-10-25 DIAGNOSIS — I5030 Unspecified diastolic (congestive) heart failure: Secondary | ICD-10-CM | POA: Diagnosis not present

## 2017-10-25 DIAGNOSIS — J189 Pneumonia, unspecified organism: Secondary | ICD-10-CM | POA: Diagnosis not present

## 2017-10-25 DIAGNOSIS — I10 Essential (primary) hypertension: Secondary | ICD-10-CM | POA: Diagnosis not present

## 2017-10-25 DIAGNOSIS — M059 Rheumatoid arthritis with rheumatoid factor, unspecified: Secondary | ICD-10-CM | POA: Diagnosis not present

## 2017-10-28 DIAGNOSIS — I10 Essential (primary) hypertension: Secondary | ICD-10-CM | POA: Diagnosis not present

## 2017-10-28 DIAGNOSIS — I5030 Unspecified diastolic (congestive) heart failure: Secondary | ICD-10-CM | POA: Diagnosis not present

## 2017-10-28 DIAGNOSIS — J189 Pneumonia, unspecified organism: Secondary | ICD-10-CM | POA: Diagnosis not present

## 2017-10-28 DIAGNOSIS — F339 Major depressive disorder, recurrent, unspecified: Secondary | ICD-10-CM | POA: Diagnosis not present

## 2017-10-28 DIAGNOSIS — M059 Rheumatoid arthritis with rheumatoid factor, unspecified: Secondary | ICD-10-CM | POA: Diagnosis not present

## 2017-10-28 DIAGNOSIS — J939 Pneumothorax, unspecified: Secondary | ICD-10-CM | POA: Diagnosis not present

## 2017-10-31 DIAGNOSIS — M059 Rheumatoid arthritis with rheumatoid factor, unspecified: Secondary | ICD-10-CM | POA: Diagnosis not present

## 2017-10-31 DIAGNOSIS — F339 Major depressive disorder, recurrent, unspecified: Secondary | ICD-10-CM | POA: Diagnosis not present

## 2017-10-31 DIAGNOSIS — J189 Pneumonia, unspecified organism: Secondary | ICD-10-CM | POA: Diagnosis not present

## 2017-10-31 DIAGNOSIS — I5030 Unspecified diastolic (congestive) heart failure: Secondary | ICD-10-CM | POA: Diagnosis not present

## 2017-10-31 DIAGNOSIS — I10 Essential (primary) hypertension: Secondary | ICD-10-CM | POA: Diagnosis not present

## 2017-10-31 DIAGNOSIS — J939 Pneumothorax, unspecified: Secondary | ICD-10-CM | POA: Diagnosis not present

## 2017-11-01 DIAGNOSIS — I5030 Unspecified diastolic (congestive) heart failure: Secondary | ICD-10-CM | POA: Diagnosis not present

## 2017-11-01 DIAGNOSIS — J189 Pneumonia, unspecified organism: Secondary | ICD-10-CM | POA: Diagnosis not present

## 2017-11-01 DIAGNOSIS — I10 Essential (primary) hypertension: Secondary | ICD-10-CM | POA: Diagnosis not present

## 2017-11-01 DIAGNOSIS — F339 Major depressive disorder, recurrent, unspecified: Secondary | ICD-10-CM | POA: Diagnosis not present

## 2017-11-01 DIAGNOSIS — J939 Pneumothorax, unspecified: Secondary | ICD-10-CM | POA: Diagnosis not present

## 2017-11-01 DIAGNOSIS — M059 Rheumatoid arthritis with rheumatoid factor, unspecified: Secondary | ICD-10-CM | POA: Diagnosis not present

## 2017-11-04 DIAGNOSIS — I5030 Unspecified diastolic (congestive) heart failure: Secondary | ICD-10-CM | POA: Diagnosis not present

## 2017-11-04 DIAGNOSIS — J939 Pneumothorax, unspecified: Secondary | ICD-10-CM | POA: Diagnosis not present

## 2017-11-04 DIAGNOSIS — J189 Pneumonia, unspecified organism: Secondary | ICD-10-CM | POA: Diagnosis not present

## 2017-11-04 DIAGNOSIS — M059 Rheumatoid arthritis with rheumatoid factor, unspecified: Secondary | ICD-10-CM | POA: Diagnosis not present

## 2017-11-04 DIAGNOSIS — I10 Essential (primary) hypertension: Secondary | ICD-10-CM | POA: Diagnosis not present

## 2017-11-04 DIAGNOSIS — F339 Major depressive disorder, recurrent, unspecified: Secondary | ICD-10-CM | POA: Diagnosis not present

## 2017-11-06 DIAGNOSIS — I5030 Unspecified diastolic (congestive) heart failure: Secondary | ICD-10-CM | POA: Diagnosis not present

## 2017-11-06 DIAGNOSIS — J189 Pneumonia, unspecified organism: Secondary | ICD-10-CM | POA: Diagnosis not present

## 2017-11-06 DIAGNOSIS — M059 Rheumatoid arthritis with rheumatoid factor, unspecified: Secondary | ICD-10-CM | POA: Diagnosis not present

## 2017-11-06 DIAGNOSIS — I10 Essential (primary) hypertension: Secondary | ICD-10-CM | POA: Diagnosis not present

## 2017-11-06 DIAGNOSIS — J939 Pneumothorax, unspecified: Secondary | ICD-10-CM | POA: Diagnosis not present

## 2017-11-06 DIAGNOSIS — F339 Major depressive disorder, recurrent, unspecified: Secondary | ICD-10-CM | POA: Diagnosis not present

## 2017-11-07 DIAGNOSIS — J939 Pneumothorax, unspecified: Secondary | ICD-10-CM | POA: Diagnosis not present

## 2017-11-07 DIAGNOSIS — J189 Pneumonia, unspecified organism: Secondary | ICD-10-CM | POA: Diagnosis not present

## 2017-11-07 DIAGNOSIS — F339 Major depressive disorder, recurrent, unspecified: Secondary | ICD-10-CM | POA: Diagnosis not present

## 2017-11-07 DIAGNOSIS — I10 Essential (primary) hypertension: Secondary | ICD-10-CM | POA: Diagnosis not present

## 2017-11-07 DIAGNOSIS — M059 Rheumatoid arthritis with rheumatoid factor, unspecified: Secondary | ICD-10-CM | POA: Diagnosis not present

## 2017-11-07 DIAGNOSIS — I5030 Unspecified diastolic (congestive) heart failure: Secondary | ICD-10-CM | POA: Diagnosis not present

## 2017-11-11 DIAGNOSIS — M059 Rheumatoid arthritis with rheumatoid factor, unspecified: Secondary | ICD-10-CM | POA: Diagnosis not present

## 2017-11-11 DIAGNOSIS — J939 Pneumothorax, unspecified: Secondary | ICD-10-CM | POA: Diagnosis not present

## 2017-11-11 DIAGNOSIS — F339 Major depressive disorder, recurrent, unspecified: Secondary | ICD-10-CM | POA: Diagnosis not present

## 2017-11-11 DIAGNOSIS — I10 Essential (primary) hypertension: Secondary | ICD-10-CM | POA: Diagnosis not present

## 2017-11-11 DIAGNOSIS — J189 Pneumonia, unspecified organism: Secondary | ICD-10-CM | POA: Diagnosis not present

## 2017-11-11 DIAGNOSIS — I5030 Unspecified diastolic (congestive) heart failure: Secondary | ICD-10-CM | POA: Diagnosis not present

## 2017-11-13 DIAGNOSIS — F339 Major depressive disorder, recurrent, unspecified: Secondary | ICD-10-CM | POA: Diagnosis not present

## 2017-11-13 DIAGNOSIS — I10 Essential (primary) hypertension: Secondary | ICD-10-CM | POA: Diagnosis not present

## 2017-11-13 DIAGNOSIS — J189 Pneumonia, unspecified organism: Secondary | ICD-10-CM | POA: Diagnosis not present

## 2017-11-13 DIAGNOSIS — M059 Rheumatoid arthritis with rheumatoid factor, unspecified: Secondary | ICD-10-CM | POA: Diagnosis not present

## 2017-11-13 DIAGNOSIS — J939 Pneumothorax, unspecified: Secondary | ICD-10-CM | POA: Diagnosis not present

## 2017-11-13 DIAGNOSIS — I5030 Unspecified diastolic (congestive) heart failure: Secondary | ICD-10-CM | POA: Diagnosis not present

## 2017-11-15 DIAGNOSIS — M059 Rheumatoid arthritis with rheumatoid factor, unspecified: Secondary | ICD-10-CM | POA: Diagnosis not present

## 2017-11-15 DIAGNOSIS — J189 Pneumonia, unspecified organism: Secondary | ICD-10-CM | POA: Diagnosis not present

## 2017-11-15 DIAGNOSIS — I5030 Unspecified diastolic (congestive) heart failure: Secondary | ICD-10-CM | POA: Diagnosis not present

## 2017-11-15 DIAGNOSIS — F339 Major depressive disorder, recurrent, unspecified: Secondary | ICD-10-CM | POA: Diagnosis not present

## 2017-11-15 DIAGNOSIS — I10 Essential (primary) hypertension: Secondary | ICD-10-CM | POA: Diagnosis not present

## 2017-11-15 DIAGNOSIS — J939 Pneumothorax, unspecified: Secondary | ICD-10-CM | POA: Diagnosis not present

## 2017-11-18 DIAGNOSIS — J939 Pneumothorax, unspecified: Secondary | ICD-10-CM | POA: Diagnosis not present

## 2017-11-18 DIAGNOSIS — M059 Rheumatoid arthritis with rheumatoid factor, unspecified: Secondary | ICD-10-CM | POA: Diagnosis not present

## 2017-11-18 DIAGNOSIS — I10 Essential (primary) hypertension: Secondary | ICD-10-CM | POA: Diagnosis not present

## 2017-11-18 DIAGNOSIS — I5030 Unspecified diastolic (congestive) heart failure: Secondary | ICD-10-CM | POA: Diagnosis not present

## 2017-11-18 DIAGNOSIS — J189 Pneumonia, unspecified organism: Secondary | ICD-10-CM | POA: Diagnosis not present

## 2017-11-18 DIAGNOSIS — F339 Major depressive disorder, recurrent, unspecified: Secondary | ICD-10-CM | POA: Diagnosis not present

## 2017-11-20 DIAGNOSIS — I10 Essential (primary) hypertension: Secondary | ICD-10-CM | POA: Diagnosis not present

## 2017-11-20 DIAGNOSIS — J939 Pneumothorax, unspecified: Secondary | ICD-10-CM | POA: Diagnosis not present

## 2017-11-20 DIAGNOSIS — J189 Pneumonia, unspecified organism: Secondary | ICD-10-CM | POA: Diagnosis not present

## 2017-11-20 DIAGNOSIS — M059 Rheumatoid arthritis with rheumatoid factor, unspecified: Secondary | ICD-10-CM | POA: Diagnosis not present

## 2017-11-20 DIAGNOSIS — F339 Major depressive disorder, recurrent, unspecified: Secondary | ICD-10-CM | POA: Diagnosis not present

## 2017-11-20 DIAGNOSIS — I5030 Unspecified diastolic (congestive) heart failure: Secondary | ICD-10-CM | POA: Diagnosis not present

## 2017-11-21 DIAGNOSIS — J939 Pneumothorax, unspecified: Secondary | ICD-10-CM | POA: Diagnosis not present

## 2017-11-21 DIAGNOSIS — I10 Essential (primary) hypertension: Secondary | ICD-10-CM | POA: Diagnosis not present

## 2017-11-21 DIAGNOSIS — F339 Major depressive disorder, recurrent, unspecified: Secondary | ICD-10-CM | POA: Diagnosis not present

## 2017-11-21 DIAGNOSIS — M059 Rheumatoid arthritis with rheumatoid factor, unspecified: Secondary | ICD-10-CM | POA: Diagnosis not present

## 2017-11-21 DIAGNOSIS — I5030 Unspecified diastolic (congestive) heart failure: Secondary | ICD-10-CM | POA: Diagnosis not present

## 2017-11-21 DIAGNOSIS — J189 Pneumonia, unspecified organism: Secondary | ICD-10-CM | POA: Diagnosis not present

## 2017-11-22 DIAGNOSIS — F339 Major depressive disorder, recurrent, unspecified: Secondary | ICD-10-CM | POA: Diagnosis not present

## 2017-11-22 DIAGNOSIS — I10 Essential (primary) hypertension: Secondary | ICD-10-CM | POA: Diagnosis not present

## 2017-11-22 DIAGNOSIS — N3281 Overactive bladder: Secondary | ICD-10-CM | POA: Diagnosis not present

## 2017-11-22 DIAGNOSIS — M059 Rheumatoid arthritis with rheumatoid factor, unspecified: Secondary | ICD-10-CM | POA: Diagnosis not present

## 2017-11-22 DIAGNOSIS — I5030 Unspecified diastolic (congestive) heart failure: Secondary | ICD-10-CM | POA: Diagnosis not present

## 2017-11-22 DIAGNOSIS — K219 Gastro-esophageal reflux disease without esophagitis: Secondary | ICD-10-CM | POA: Diagnosis not present

## 2017-11-22 DIAGNOSIS — E039 Hypothyroidism, unspecified: Secondary | ICD-10-CM | POA: Diagnosis not present

## 2017-11-22 DIAGNOSIS — J939 Pneumothorax, unspecified: Secondary | ICD-10-CM | POA: Diagnosis not present

## 2017-11-22 DIAGNOSIS — J189 Pneumonia, unspecified organism: Secondary | ICD-10-CM | POA: Diagnosis not present

## 2017-11-25 DIAGNOSIS — I5030 Unspecified diastolic (congestive) heart failure: Secondary | ICD-10-CM | POA: Diagnosis not present

## 2017-11-25 DIAGNOSIS — M059 Rheumatoid arthritis with rheumatoid factor, unspecified: Secondary | ICD-10-CM | POA: Diagnosis not present

## 2017-11-25 DIAGNOSIS — J189 Pneumonia, unspecified organism: Secondary | ICD-10-CM | POA: Diagnosis not present

## 2017-11-25 DIAGNOSIS — I10 Essential (primary) hypertension: Secondary | ICD-10-CM | POA: Diagnosis not present

## 2017-11-25 DIAGNOSIS — F339 Major depressive disorder, recurrent, unspecified: Secondary | ICD-10-CM | POA: Diagnosis not present

## 2017-11-25 DIAGNOSIS — J939 Pneumothorax, unspecified: Secondary | ICD-10-CM | POA: Diagnosis not present

## 2017-11-28 DIAGNOSIS — J939 Pneumothorax, unspecified: Secondary | ICD-10-CM | POA: Diagnosis not present

## 2017-11-28 DIAGNOSIS — M059 Rheumatoid arthritis with rheumatoid factor, unspecified: Secondary | ICD-10-CM | POA: Diagnosis not present

## 2017-11-28 DIAGNOSIS — I5030 Unspecified diastolic (congestive) heart failure: Secondary | ICD-10-CM | POA: Diagnosis not present

## 2017-11-28 DIAGNOSIS — F339 Major depressive disorder, recurrent, unspecified: Secondary | ICD-10-CM | POA: Diagnosis not present

## 2017-11-28 DIAGNOSIS — I10 Essential (primary) hypertension: Secondary | ICD-10-CM | POA: Diagnosis not present

## 2017-11-28 DIAGNOSIS — J189 Pneumonia, unspecified organism: Secondary | ICD-10-CM | POA: Diagnosis not present

## 2017-11-29 DIAGNOSIS — J939 Pneumothorax, unspecified: Secondary | ICD-10-CM | POA: Diagnosis not present

## 2017-11-29 DIAGNOSIS — I5030 Unspecified diastolic (congestive) heart failure: Secondary | ICD-10-CM | POA: Diagnosis not present

## 2017-11-29 DIAGNOSIS — J189 Pneumonia, unspecified organism: Secondary | ICD-10-CM | POA: Diagnosis not present

## 2017-11-29 DIAGNOSIS — M059 Rheumatoid arthritis with rheumatoid factor, unspecified: Secondary | ICD-10-CM | POA: Diagnosis not present

## 2017-11-29 DIAGNOSIS — I10 Essential (primary) hypertension: Secondary | ICD-10-CM | POA: Diagnosis not present

## 2017-11-29 DIAGNOSIS — F339 Major depressive disorder, recurrent, unspecified: Secondary | ICD-10-CM | POA: Diagnosis not present

## 2017-12-02 DIAGNOSIS — J939 Pneumothorax, unspecified: Secondary | ICD-10-CM | POA: Diagnosis not present

## 2017-12-02 DIAGNOSIS — I5030 Unspecified diastolic (congestive) heart failure: Secondary | ICD-10-CM | POA: Diagnosis not present

## 2017-12-02 DIAGNOSIS — F339 Major depressive disorder, recurrent, unspecified: Secondary | ICD-10-CM | POA: Diagnosis not present

## 2017-12-02 DIAGNOSIS — I10 Essential (primary) hypertension: Secondary | ICD-10-CM | POA: Diagnosis not present

## 2017-12-02 DIAGNOSIS — M059 Rheumatoid arthritis with rheumatoid factor, unspecified: Secondary | ICD-10-CM | POA: Diagnosis not present

## 2017-12-02 DIAGNOSIS — J189 Pneumonia, unspecified organism: Secondary | ICD-10-CM | POA: Diagnosis not present

## 2017-12-05 DIAGNOSIS — M059 Rheumatoid arthritis with rheumatoid factor, unspecified: Secondary | ICD-10-CM | POA: Diagnosis not present

## 2017-12-05 DIAGNOSIS — I5030 Unspecified diastolic (congestive) heart failure: Secondary | ICD-10-CM | POA: Diagnosis not present

## 2017-12-05 DIAGNOSIS — I10 Essential (primary) hypertension: Secondary | ICD-10-CM | POA: Diagnosis not present

## 2017-12-05 DIAGNOSIS — J939 Pneumothorax, unspecified: Secondary | ICD-10-CM | POA: Diagnosis not present

## 2017-12-05 DIAGNOSIS — F339 Major depressive disorder, recurrent, unspecified: Secondary | ICD-10-CM | POA: Diagnosis not present

## 2017-12-05 DIAGNOSIS — J189 Pneumonia, unspecified organism: Secondary | ICD-10-CM | POA: Diagnosis not present

## 2017-12-06 DIAGNOSIS — I10 Essential (primary) hypertension: Secondary | ICD-10-CM | POA: Diagnosis not present

## 2017-12-06 DIAGNOSIS — F339 Major depressive disorder, recurrent, unspecified: Secondary | ICD-10-CM | POA: Diagnosis not present

## 2017-12-06 DIAGNOSIS — J939 Pneumothorax, unspecified: Secondary | ICD-10-CM | POA: Diagnosis not present

## 2017-12-06 DIAGNOSIS — M059 Rheumatoid arthritis with rheumatoid factor, unspecified: Secondary | ICD-10-CM | POA: Diagnosis not present

## 2017-12-06 DIAGNOSIS — J189 Pneumonia, unspecified organism: Secondary | ICD-10-CM | POA: Diagnosis not present

## 2017-12-06 DIAGNOSIS — I5030 Unspecified diastolic (congestive) heart failure: Secondary | ICD-10-CM | POA: Diagnosis not present

## 2017-12-11 DIAGNOSIS — I5030 Unspecified diastolic (congestive) heart failure: Secondary | ICD-10-CM | POA: Diagnosis not present

## 2017-12-11 DIAGNOSIS — F339 Major depressive disorder, recurrent, unspecified: Secondary | ICD-10-CM | POA: Diagnosis not present

## 2017-12-11 DIAGNOSIS — I10 Essential (primary) hypertension: Secondary | ICD-10-CM | POA: Diagnosis not present

## 2017-12-11 DIAGNOSIS — J189 Pneumonia, unspecified organism: Secondary | ICD-10-CM | POA: Diagnosis not present

## 2017-12-11 DIAGNOSIS — M059 Rheumatoid arthritis with rheumatoid factor, unspecified: Secondary | ICD-10-CM | POA: Diagnosis not present

## 2017-12-11 DIAGNOSIS — J939 Pneumothorax, unspecified: Secondary | ICD-10-CM | POA: Diagnosis not present

## 2017-12-13 DIAGNOSIS — I5030 Unspecified diastolic (congestive) heart failure: Secondary | ICD-10-CM | POA: Diagnosis not present

## 2017-12-13 DIAGNOSIS — J189 Pneumonia, unspecified organism: Secondary | ICD-10-CM | POA: Diagnosis not present

## 2017-12-13 DIAGNOSIS — F339 Major depressive disorder, recurrent, unspecified: Secondary | ICD-10-CM | POA: Diagnosis not present

## 2017-12-13 DIAGNOSIS — J939 Pneumothorax, unspecified: Secondary | ICD-10-CM | POA: Diagnosis not present

## 2017-12-13 DIAGNOSIS — I10 Essential (primary) hypertension: Secondary | ICD-10-CM | POA: Diagnosis not present

## 2017-12-13 DIAGNOSIS — M059 Rheumatoid arthritis with rheumatoid factor, unspecified: Secondary | ICD-10-CM | POA: Diagnosis not present

## 2017-12-17 DIAGNOSIS — F339 Major depressive disorder, recurrent, unspecified: Secondary | ICD-10-CM | POA: Diagnosis not present

## 2017-12-17 DIAGNOSIS — I10 Essential (primary) hypertension: Secondary | ICD-10-CM | POA: Diagnosis not present

## 2017-12-17 DIAGNOSIS — M059 Rheumatoid arthritis with rheumatoid factor, unspecified: Secondary | ICD-10-CM | POA: Diagnosis not present

## 2017-12-17 DIAGNOSIS — J939 Pneumothorax, unspecified: Secondary | ICD-10-CM | POA: Diagnosis not present

## 2017-12-17 DIAGNOSIS — I5030 Unspecified diastolic (congestive) heart failure: Secondary | ICD-10-CM | POA: Diagnosis not present

## 2017-12-17 DIAGNOSIS — J189 Pneumonia, unspecified organism: Secondary | ICD-10-CM | POA: Diagnosis not present

## 2017-12-19 DIAGNOSIS — M059 Rheumatoid arthritis with rheumatoid factor, unspecified: Secondary | ICD-10-CM | POA: Diagnosis not present

## 2017-12-19 DIAGNOSIS — I5030 Unspecified diastolic (congestive) heart failure: Secondary | ICD-10-CM | POA: Diagnosis not present

## 2017-12-19 DIAGNOSIS — J189 Pneumonia, unspecified organism: Secondary | ICD-10-CM | POA: Diagnosis not present

## 2017-12-19 DIAGNOSIS — I10 Essential (primary) hypertension: Secondary | ICD-10-CM | POA: Diagnosis not present

## 2017-12-19 DIAGNOSIS — F339 Major depressive disorder, recurrent, unspecified: Secondary | ICD-10-CM | POA: Diagnosis not present

## 2017-12-19 DIAGNOSIS — J939 Pneumothorax, unspecified: Secondary | ICD-10-CM | POA: Diagnosis not present

## 2017-12-20 DIAGNOSIS — I10 Essential (primary) hypertension: Secondary | ICD-10-CM | POA: Diagnosis not present

## 2017-12-20 DIAGNOSIS — J939 Pneumothorax, unspecified: Secondary | ICD-10-CM | POA: Diagnosis not present

## 2017-12-20 DIAGNOSIS — K219 Gastro-esophageal reflux disease without esophagitis: Secondary | ICD-10-CM | POA: Diagnosis not present

## 2017-12-20 DIAGNOSIS — I5030 Unspecified diastolic (congestive) heart failure: Secondary | ICD-10-CM | POA: Diagnosis not present

## 2017-12-20 DIAGNOSIS — N3281 Overactive bladder: Secondary | ICD-10-CM | POA: Diagnosis not present

## 2017-12-20 DIAGNOSIS — E039 Hypothyroidism, unspecified: Secondary | ICD-10-CM | POA: Diagnosis not present

## 2017-12-20 DIAGNOSIS — J189 Pneumonia, unspecified organism: Secondary | ICD-10-CM | POA: Diagnosis not present

## 2017-12-20 DIAGNOSIS — M059 Rheumatoid arthritis with rheumatoid factor, unspecified: Secondary | ICD-10-CM | POA: Diagnosis not present

## 2017-12-20 DIAGNOSIS — F339 Major depressive disorder, recurrent, unspecified: Secondary | ICD-10-CM | POA: Diagnosis not present

## 2017-12-23 DIAGNOSIS — I10 Essential (primary) hypertension: Secondary | ICD-10-CM | POA: Diagnosis not present

## 2017-12-23 DIAGNOSIS — J939 Pneumothorax, unspecified: Secondary | ICD-10-CM | POA: Diagnosis not present

## 2017-12-23 DIAGNOSIS — J189 Pneumonia, unspecified organism: Secondary | ICD-10-CM | POA: Diagnosis not present

## 2017-12-23 DIAGNOSIS — I5030 Unspecified diastolic (congestive) heart failure: Secondary | ICD-10-CM | POA: Diagnosis not present

## 2017-12-23 DIAGNOSIS — M059 Rheumatoid arthritis with rheumatoid factor, unspecified: Secondary | ICD-10-CM | POA: Diagnosis not present

## 2017-12-23 DIAGNOSIS — F339 Major depressive disorder, recurrent, unspecified: Secondary | ICD-10-CM | POA: Diagnosis not present

## 2017-12-24 DIAGNOSIS — J189 Pneumonia, unspecified organism: Secondary | ICD-10-CM | POA: Diagnosis not present

## 2017-12-24 DIAGNOSIS — M059 Rheumatoid arthritis with rheumatoid factor, unspecified: Secondary | ICD-10-CM | POA: Diagnosis not present

## 2017-12-24 DIAGNOSIS — J939 Pneumothorax, unspecified: Secondary | ICD-10-CM | POA: Diagnosis not present

## 2017-12-24 DIAGNOSIS — I10 Essential (primary) hypertension: Secondary | ICD-10-CM | POA: Diagnosis not present

## 2017-12-24 DIAGNOSIS — F339 Major depressive disorder, recurrent, unspecified: Secondary | ICD-10-CM | POA: Diagnosis not present

## 2017-12-24 DIAGNOSIS — I5030 Unspecified diastolic (congestive) heart failure: Secondary | ICD-10-CM | POA: Diagnosis not present

## 2017-12-26 DIAGNOSIS — F339 Major depressive disorder, recurrent, unspecified: Secondary | ICD-10-CM | POA: Diagnosis not present

## 2017-12-26 DIAGNOSIS — I10 Essential (primary) hypertension: Secondary | ICD-10-CM | POA: Diagnosis not present

## 2017-12-26 DIAGNOSIS — J939 Pneumothorax, unspecified: Secondary | ICD-10-CM | POA: Diagnosis not present

## 2017-12-26 DIAGNOSIS — I5030 Unspecified diastolic (congestive) heart failure: Secondary | ICD-10-CM | POA: Diagnosis not present

## 2017-12-26 DIAGNOSIS — J189 Pneumonia, unspecified organism: Secondary | ICD-10-CM | POA: Diagnosis not present

## 2017-12-26 DIAGNOSIS — M059 Rheumatoid arthritis with rheumatoid factor, unspecified: Secondary | ICD-10-CM | POA: Diagnosis not present

## 2017-12-27 DIAGNOSIS — J189 Pneumonia, unspecified organism: Secondary | ICD-10-CM | POA: Diagnosis not present

## 2017-12-27 DIAGNOSIS — M059 Rheumatoid arthritis with rheumatoid factor, unspecified: Secondary | ICD-10-CM | POA: Diagnosis not present

## 2017-12-27 DIAGNOSIS — I10 Essential (primary) hypertension: Secondary | ICD-10-CM | POA: Diagnosis not present

## 2017-12-27 DIAGNOSIS — J939 Pneumothorax, unspecified: Secondary | ICD-10-CM | POA: Diagnosis not present

## 2017-12-27 DIAGNOSIS — F339 Major depressive disorder, recurrent, unspecified: Secondary | ICD-10-CM | POA: Diagnosis not present

## 2017-12-27 DIAGNOSIS — I5030 Unspecified diastolic (congestive) heart failure: Secondary | ICD-10-CM | POA: Diagnosis not present

## 2017-12-30 DIAGNOSIS — J189 Pneumonia, unspecified organism: Secondary | ICD-10-CM | POA: Diagnosis not present

## 2017-12-30 DIAGNOSIS — M059 Rheumatoid arthritis with rheumatoid factor, unspecified: Secondary | ICD-10-CM | POA: Diagnosis not present

## 2017-12-30 DIAGNOSIS — F339 Major depressive disorder, recurrent, unspecified: Secondary | ICD-10-CM | POA: Diagnosis not present

## 2017-12-30 DIAGNOSIS — J939 Pneumothorax, unspecified: Secondary | ICD-10-CM | POA: Diagnosis not present

## 2017-12-30 DIAGNOSIS — I5030 Unspecified diastolic (congestive) heart failure: Secondary | ICD-10-CM | POA: Diagnosis not present

## 2017-12-30 DIAGNOSIS — I10 Essential (primary) hypertension: Secondary | ICD-10-CM | POA: Diagnosis not present

## 2017-12-31 DIAGNOSIS — J939 Pneumothorax, unspecified: Secondary | ICD-10-CM | POA: Diagnosis not present

## 2017-12-31 DIAGNOSIS — M059 Rheumatoid arthritis with rheumatoid factor, unspecified: Secondary | ICD-10-CM | POA: Diagnosis not present

## 2017-12-31 DIAGNOSIS — J189 Pneumonia, unspecified organism: Secondary | ICD-10-CM | POA: Diagnosis not present

## 2017-12-31 DIAGNOSIS — F339 Major depressive disorder, recurrent, unspecified: Secondary | ICD-10-CM | POA: Diagnosis not present

## 2017-12-31 DIAGNOSIS — I5030 Unspecified diastolic (congestive) heart failure: Secondary | ICD-10-CM | POA: Diagnosis not present

## 2017-12-31 DIAGNOSIS — I10 Essential (primary) hypertension: Secondary | ICD-10-CM | POA: Diagnosis not present

## 2018-01-03 DIAGNOSIS — I5030 Unspecified diastolic (congestive) heart failure: Secondary | ICD-10-CM | POA: Diagnosis not present

## 2018-01-03 DIAGNOSIS — J939 Pneumothorax, unspecified: Secondary | ICD-10-CM | POA: Diagnosis not present

## 2018-01-03 DIAGNOSIS — F339 Major depressive disorder, recurrent, unspecified: Secondary | ICD-10-CM | POA: Diagnosis not present

## 2018-01-03 DIAGNOSIS — J189 Pneumonia, unspecified organism: Secondary | ICD-10-CM | POA: Diagnosis not present

## 2018-01-03 DIAGNOSIS — M059 Rheumatoid arthritis with rheumatoid factor, unspecified: Secondary | ICD-10-CM | POA: Diagnosis not present

## 2018-01-03 DIAGNOSIS — I10 Essential (primary) hypertension: Secondary | ICD-10-CM | POA: Diagnosis not present

## 2018-01-06 DIAGNOSIS — F339 Major depressive disorder, recurrent, unspecified: Secondary | ICD-10-CM | POA: Diagnosis not present

## 2018-01-06 DIAGNOSIS — J939 Pneumothorax, unspecified: Secondary | ICD-10-CM | POA: Diagnosis not present

## 2018-01-06 DIAGNOSIS — J189 Pneumonia, unspecified organism: Secondary | ICD-10-CM | POA: Diagnosis not present

## 2018-01-06 DIAGNOSIS — I10 Essential (primary) hypertension: Secondary | ICD-10-CM | POA: Diagnosis not present

## 2018-01-06 DIAGNOSIS — I5030 Unspecified diastolic (congestive) heart failure: Secondary | ICD-10-CM | POA: Diagnosis not present

## 2018-01-06 DIAGNOSIS — M059 Rheumatoid arthritis with rheumatoid factor, unspecified: Secondary | ICD-10-CM | POA: Diagnosis not present

## 2018-01-07 DIAGNOSIS — I5030 Unspecified diastolic (congestive) heart failure: Secondary | ICD-10-CM | POA: Diagnosis not present

## 2018-01-07 DIAGNOSIS — J189 Pneumonia, unspecified organism: Secondary | ICD-10-CM | POA: Diagnosis not present

## 2018-01-07 DIAGNOSIS — J939 Pneumothorax, unspecified: Secondary | ICD-10-CM | POA: Diagnosis not present

## 2018-01-07 DIAGNOSIS — I10 Essential (primary) hypertension: Secondary | ICD-10-CM | POA: Diagnosis not present

## 2018-01-07 DIAGNOSIS — M059 Rheumatoid arthritis with rheumatoid factor, unspecified: Secondary | ICD-10-CM | POA: Diagnosis not present

## 2018-01-07 DIAGNOSIS — F339 Major depressive disorder, recurrent, unspecified: Secondary | ICD-10-CM | POA: Diagnosis not present

## 2018-01-10 DIAGNOSIS — J189 Pneumonia, unspecified organism: Secondary | ICD-10-CM | POA: Diagnosis not present

## 2018-01-10 DIAGNOSIS — F339 Major depressive disorder, recurrent, unspecified: Secondary | ICD-10-CM | POA: Diagnosis not present

## 2018-01-10 DIAGNOSIS — M059 Rheumatoid arthritis with rheumatoid factor, unspecified: Secondary | ICD-10-CM | POA: Diagnosis not present

## 2018-01-10 DIAGNOSIS — I5030 Unspecified diastolic (congestive) heart failure: Secondary | ICD-10-CM | POA: Diagnosis not present

## 2018-01-10 DIAGNOSIS — J939 Pneumothorax, unspecified: Secondary | ICD-10-CM | POA: Diagnosis not present

## 2018-01-10 DIAGNOSIS — I10 Essential (primary) hypertension: Secondary | ICD-10-CM | POA: Diagnosis not present

## 2018-01-13 DIAGNOSIS — J939 Pneumothorax, unspecified: Secondary | ICD-10-CM | POA: Diagnosis not present

## 2018-01-13 DIAGNOSIS — I10 Essential (primary) hypertension: Secondary | ICD-10-CM | POA: Diagnosis not present

## 2018-01-13 DIAGNOSIS — I5030 Unspecified diastolic (congestive) heart failure: Secondary | ICD-10-CM | POA: Diagnosis not present

## 2018-01-13 DIAGNOSIS — J189 Pneumonia, unspecified organism: Secondary | ICD-10-CM | POA: Diagnosis not present

## 2018-01-13 DIAGNOSIS — F339 Major depressive disorder, recurrent, unspecified: Secondary | ICD-10-CM | POA: Diagnosis not present

## 2018-01-13 DIAGNOSIS — M059 Rheumatoid arthritis with rheumatoid factor, unspecified: Secondary | ICD-10-CM | POA: Diagnosis not present

## 2018-01-14 DIAGNOSIS — F339 Major depressive disorder, recurrent, unspecified: Secondary | ICD-10-CM | POA: Diagnosis not present

## 2018-01-14 DIAGNOSIS — M059 Rheumatoid arthritis with rheumatoid factor, unspecified: Secondary | ICD-10-CM | POA: Diagnosis not present

## 2018-01-14 DIAGNOSIS — J939 Pneumothorax, unspecified: Secondary | ICD-10-CM | POA: Diagnosis not present

## 2018-01-14 DIAGNOSIS — I10 Essential (primary) hypertension: Secondary | ICD-10-CM | POA: Diagnosis not present

## 2018-01-14 DIAGNOSIS — J189 Pneumonia, unspecified organism: Secondary | ICD-10-CM | POA: Diagnosis not present

## 2018-01-14 DIAGNOSIS — I5030 Unspecified diastolic (congestive) heart failure: Secondary | ICD-10-CM | POA: Diagnosis not present

## 2018-01-17 DIAGNOSIS — I10 Essential (primary) hypertension: Secondary | ICD-10-CM | POA: Diagnosis not present

## 2018-01-17 DIAGNOSIS — M059 Rheumatoid arthritis with rheumatoid factor, unspecified: Secondary | ICD-10-CM | POA: Diagnosis not present

## 2018-01-17 DIAGNOSIS — I5030 Unspecified diastolic (congestive) heart failure: Secondary | ICD-10-CM | POA: Diagnosis not present

## 2018-01-17 DIAGNOSIS — J939 Pneumothorax, unspecified: Secondary | ICD-10-CM | POA: Diagnosis not present

## 2018-01-17 DIAGNOSIS — J189 Pneumonia, unspecified organism: Secondary | ICD-10-CM | POA: Diagnosis not present

## 2018-01-17 DIAGNOSIS — F339 Major depressive disorder, recurrent, unspecified: Secondary | ICD-10-CM | POA: Diagnosis not present

## 2018-01-20 DIAGNOSIS — J939 Pneumothorax, unspecified: Secondary | ICD-10-CM | POA: Diagnosis not present

## 2018-01-20 DIAGNOSIS — N3281 Overactive bladder: Secondary | ICD-10-CM | POA: Diagnosis not present

## 2018-01-20 DIAGNOSIS — M059 Rheumatoid arthritis with rheumatoid factor, unspecified: Secondary | ICD-10-CM | POA: Diagnosis not present

## 2018-01-20 DIAGNOSIS — F339 Major depressive disorder, recurrent, unspecified: Secondary | ICD-10-CM | POA: Diagnosis not present

## 2018-01-20 DIAGNOSIS — E039 Hypothyroidism, unspecified: Secondary | ICD-10-CM | POA: Diagnosis not present

## 2018-01-20 DIAGNOSIS — I5030 Unspecified diastolic (congestive) heart failure: Secondary | ICD-10-CM | POA: Diagnosis not present

## 2018-01-20 DIAGNOSIS — J189 Pneumonia, unspecified organism: Secondary | ICD-10-CM | POA: Diagnosis not present

## 2018-01-20 DIAGNOSIS — I10 Essential (primary) hypertension: Secondary | ICD-10-CM | POA: Diagnosis not present

## 2018-01-20 DIAGNOSIS — K219 Gastro-esophageal reflux disease without esophagitis: Secondary | ICD-10-CM | POA: Diagnosis not present

## 2018-01-21 DIAGNOSIS — I5030 Unspecified diastolic (congestive) heart failure: Secondary | ICD-10-CM | POA: Diagnosis not present

## 2018-01-21 DIAGNOSIS — F339 Major depressive disorder, recurrent, unspecified: Secondary | ICD-10-CM | POA: Diagnosis not present

## 2018-01-21 DIAGNOSIS — I10 Essential (primary) hypertension: Secondary | ICD-10-CM | POA: Diagnosis not present

## 2018-01-21 DIAGNOSIS — J189 Pneumonia, unspecified organism: Secondary | ICD-10-CM | POA: Diagnosis not present

## 2018-01-21 DIAGNOSIS — M059 Rheumatoid arthritis with rheumatoid factor, unspecified: Secondary | ICD-10-CM | POA: Diagnosis not present

## 2018-01-21 DIAGNOSIS — J939 Pneumothorax, unspecified: Secondary | ICD-10-CM | POA: Diagnosis not present

## 2018-01-23 DIAGNOSIS — M059 Rheumatoid arthritis with rheumatoid factor, unspecified: Secondary | ICD-10-CM | POA: Diagnosis not present

## 2018-01-23 DIAGNOSIS — I5030 Unspecified diastolic (congestive) heart failure: Secondary | ICD-10-CM | POA: Diagnosis not present

## 2018-01-23 DIAGNOSIS — J189 Pneumonia, unspecified organism: Secondary | ICD-10-CM | POA: Diagnosis not present

## 2018-01-23 DIAGNOSIS — I10 Essential (primary) hypertension: Secondary | ICD-10-CM | POA: Diagnosis not present

## 2018-01-23 DIAGNOSIS — J939 Pneumothorax, unspecified: Secondary | ICD-10-CM | POA: Diagnosis not present

## 2018-01-23 DIAGNOSIS — F339 Major depressive disorder, recurrent, unspecified: Secondary | ICD-10-CM | POA: Diagnosis not present

## 2018-01-24 DIAGNOSIS — J189 Pneumonia, unspecified organism: Secondary | ICD-10-CM | POA: Diagnosis not present

## 2018-01-24 DIAGNOSIS — J939 Pneumothorax, unspecified: Secondary | ICD-10-CM | POA: Diagnosis not present

## 2018-01-24 DIAGNOSIS — M059 Rheumatoid arthritis with rheumatoid factor, unspecified: Secondary | ICD-10-CM | POA: Diagnosis not present

## 2018-01-24 DIAGNOSIS — I5030 Unspecified diastolic (congestive) heart failure: Secondary | ICD-10-CM | POA: Diagnosis not present

## 2018-01-24 DIAGNOSIS — F339 Major depressive disorder, recurrent, unspecified: Secondary | ICD-10-CM | POA: Diagnosis not present

## 2018-01-24 DIAGNOSIS — I10 Essential (primary) hypertension: Secondary | ICD-10-CM | POA: Diagnosis not present

## 2018-01-25 DIAGNOSIS — F339 Major depressive disorder, recurrent, unspecified: Secondary | ICD-10-CM | POA: Diagnosis not present

## 2018-01-25 DIAGNOSIS — I10 Essential (primary) hypertension: Secondary | ICD-10-CM | POA: Diagnosis not present

## 2018-01-25 DIAGNOSIS — J189 Pneumonia, unspecified organism: Secondary | ICD-10-CM | POA: Diagnosis not present

## 2018-01-25 DIAGNOSIS — J939 Pneumothorax, unspecified: Secondary | ICD-10-CM | POA: Diagnosis not present

## 2018-01-25 DIAGNOSIS — M059 Rheumatoid arthritis with rheumatoid factor, unspecified: Secondary | ICD-10-CM | POA: Diagnosis not present

## 2018-01-25 DIAGNOSIS — I5030 Unspecified diastolic (congestive) heart failure: Secondary | ICD-10-CM | POA: Diagnosis not present

## 2018-01-27 DIAGNOSIS — I5030 Unspecified diastolic (congestive) heart failure: Secondary | ICD-10-CM | POA: Diagnosis not present

## 2018-01-27 DIAGNOSIS — M059 Rheumatoid arthritis with rheumatoid factor, unspecified: Secondary | ICD-10-CM | POA: Diagnosis not present

## 2018-01-27 DIAGNOSIS — J189 Pneumonia, unspecified organism: Secondary | ICD-10-CM | POA: Diagnosis not present

## 2018-01-27 DIAGNOSIS — J939 Pneumothorax, unspecified: Secondary | ICD-10-CM | POA: Diagnosis not present

## 2018-01-27 DIAGNOSIS — F339 Major depressive disorder, recurrent, unspecified: Secondary | ICD-10-CM | POA: Diagnosis not present

## 2018-01-27 DIAGNOSIS — I10 Essential (primary) hypertension: Secondary | ICD-10-CM | POA: Diagnosis not present

## 2018-01-28 DIAGNOSIS — I5030 Unspecified diastolic (congestive) heart failure: Secondary | ICD-10-CM | POA: Diagnosis not present

## 2018-01-28 DIAGNOSIS — I10 Essential (primary) hypertension: Secondary | ICD-10-CM | POA: Diagnosis not present

## 2018-01-28 DIAGNOSIS — J189 Pneumonia, unspecified organism: Secondary | ICD-10-CM | POA: Diagnosis not present

## 2018-01-28 DIAGNOSIS — M059 Rheumatoid arthritis with rheumatoid factor, unspecified: Secondary | ICD-10-CM | POA: Diagnosis not present

## 2018-01-28 DIAGNOSIS — F339 Major depressive disorder, recurrent, unspecified: Secondary | ICD-10-CM | POA: Diagnosis not present

## 2018-01-28 DIAGNOSIS — J939 Pneumothorax, unspecified: Secondary | ICD-10-CM | POA: Diagnosis not present

## 2018-01-30 DIAGNOSIS — J189 Pneumonia, unspecified organism: Secondary | ICD-10-CM | POA: Diagnosis not present

## 2018-01-30 DIAGNOSIS — I10 Essential (primary) hypertension: Secondary | ICD-10-CM | POA: Diagnosis not present

## 2018-01-30 DIAGNOSIS — I5030 Unspecified diastolic (congestive) heart failure: Secondary | ICD-10-CM | POA: Diagnosis not present

## 2018-01-30 DIAGNOSIS — F339 Major depressive disorder, recurrent, unspecified: Secondary | ICD-10-CM | POA: Diagnosis not present

## 2018-01-30 DIAGNOSIS — M059 Rheumatoid arthritis with rheumatoid factor, unspecified: Secondary | ICD-10-CM | POA: Diagnosis not present

## 2018-01-30 DIAGNOSIS — J939 Pneumothorax, unspecified: Secondary | ICD-10-CM | POA: Diagnosis not present

## 2018-01-31 DIAGNOSIS — M059 Rheumatoid arthritis with rheumatoid factor, unspecified: Secondary | ICD-10-CM | POA: Diagnosis not present

## 2018-01-31 DIAGNOSIS — I10 Essential (primary) hypertension: Secondary | ICD-10-CM | POA: Diagnosis not present

## 2018-01-31 DIAGNOSIS — J189 Pneumonia, unspecified organism: Secondary | ICD-10-CM | POA: Diagnosis not present

## 2018-01-31 DIAGNOSIS — F339 Major depressive disorder, recurrent, unspecified: Secondary | ICD-10-CM | POA: Diagnosis not present

## 2018-01-31 DIAGNOSIS — J939 Pneumothorax, unspecified: Secondary | ICD-10-CM | POA: Diagnosis not present

## 2018-01-31 DIAGNOSIS — I5030 Unspecified diastolic (congestive) heart failure: Secondary | ICD-10-CM | POA: Diagnosis not present

## 2018-02-03 DIAGNOSIS — I5030 Unspecified diastolic (congestive) heart failure: Secondary | ICD-10-CM | POA: Diagnosis not present

## 2018-02-03 DIAGNOSIS — J189 Pneumonia, unspecified organism: Secondary | ICD-10-CM | POA: Diagnosis not present

## 2018-02-03 DIAGNOSIS — F339 Major depressive disorder, recurrent, unspecified: Secondary | ICD-10-CM | POA: Diagnosis not present

## 2018-02-03 DIAGNOSIS — I10 Essential (primary) hypertension: Secondary | ICD-10-CM | POA: Diagnosis not present

## 2018-02-03 DIAGNOSIS — M059 Rheumatoid arthritis with rheumatoid factor, unspecified: Secondary | ICD-10-CM | POA: Diagnosis not present

## 2018-02-03 DIAGNOSIS — J939 Pneumothorax, unspecified: Secondary | ICD-10-CM | POA: Diagnosis not present

## 2018-02-10 DIAGNOSIS — F339 Major depressive disorder, recurrent, unspecified: Secondary | ICD-10-CM | POA: Diagnosis not present

## 2018-02-10 DIAGNOSIS — J939 Pneumothorax, unspecified: Secondary | ICD-10-CM | POA: Diagnosis not present

## 2018-02-10 DIAGNOSIS — I5030 Unspecified diastolic (congestive) heart failure: Secondary | ICD-10-CM | POA: Diagnosis not present

## 2018-02-10 DIAGNOSIS — M059 Rheumatoid arthritis with rheumatoid factor, unspecified: Secondary | ICD-10-CM | POA: Diagnosis not present

## 2018-02-10 DIAGNOSIS — J189 Pneumonia, unspecified organism: Secondary | ICD-10-CM | POA: Diagnosis not present

## 2018-02-10 DIAGNOSIS — I10 Essential (primary) hypertension: Secondary | ICD-10-CM | POA: Diagnosis not present

## 2018-02-14 DIAGNOSIS — I5030 Unspecified diastolic (congestive) heart failure: Secondary | ICD-10-CM | POA: Diagnosis not present

## 2018-02-14 DIAGNOSIS — F339 Major depressive disorder, recurrent, unspecified: Secondary | ICD-10-CM | POA: Diagnosis not present

## 2018-02-14 DIAGNOSIS — M059 Rheumatoid arthritis with rheumatoid factor, unspecified: Secondary | ICD-10-CM | POA: Diagnosis not present

## 2018-02-14 DIAGNOSIS — J189 Pneumonia, unspecified organism: Secondary | ICD-10-CM | POA: Diagnosis not present

## 2018-02-14 DIAGNOSIS — I10 Essential (primary) hypertension: Secondary | ICD-10-CM | POA: Diagnosis not present

## 2018-02-14 DIAGNOSIS — J939 Pneumothorax, unspecified: Secondary | ICD-10-CM | POA: Diagnosis not present

## 2018-02-17 DIAGNOSIS — M059 Rheumatoid arthritis with rheumatoid factor, unspecified: Secondary | ICD-10-CM | POA: Diagnosis not present

## 2018-02-17 DIAGNOSIS — F339 Major depressive disorder, recurrent, unspecified: Secondary | ICD-10-CM | POA: Diagnosis not present

## 2018-02-17 DIAGNOSIS — I10 Essential (primary) hypertension: Secondary | ICD-10-CM | POA: Diagnosis not present

## 2018-02-17 DIAGNOSIS — I5030 Unspecified diastolic (congestive) heart failure: Secondary | ICD-10-CM | POA: Diagnosis not present

## 2018-02-17 DIAGNOSIS — J189 Pneumonia, unspecified organism: Secondary | ICD-10-CM | POA: Diagnosis not present

## 2018-02-17 DIAGNOSIS — J939 Pneumothorax, unspecified: Secondary | ICD-10-CM | POA: Diagnosis not present

## 2018-02-18 DIAGNOSIS — I5030 Unspecified diastolic (congestive) heart failure: Secondary | ICD-10-CM | POA: Diagnosis not present

## 2018-02-18 DIAGNOSIS — F339 Major depressive disorder, recurrent, unspecified: Secondary | ICD-10-CM | POA: Diagnosis not present

## 2018-02-18 DIAGNOSIS — I10 Essential (primary) hypertension: Secondary | ICD-10-CM | POA: Diagnosis not present

## 2018-02-18 DIAGNOSIS — M059 Rheumatoid arthritis with rheumatoid factor, unspecified: Secondary | ICD-10-CM | POA: Diagnosis not present

## 2018-02-18 DIAGNOSIS — J939 Pneumothorax, unspecified: Secondary | ICD-10-CM | POA: Diagnosis not present

## 2018-02-18 DIAGNOSIS — J189 Pneumonia, unspecified organism: Secondary | ICD-10-CM | POA: Diagnosis not present

## 2018-02-19 DIAGNOSIS — I5030 Unspecified diastolic (congestive) heart failure: Secondary | ICD-10-CM | POA: Diagnosis not present

## 2018-02-19 DIAGNOSIS — I10 Essential (primary) hypertension: Secondary | ICD-10-CM | POA: Diagnosis not present

## 2018-02-19 DIAGNOSIS — K219 Gastro-esophageal reflux disease without esophagitis: Secondary | ICD-10-CM | POA: Diagnosis not present

## 2018-02-19 DIAGNOSIS — J189 Pneumonia, unspecified organism: Secondary | ICD-10-CM | POA: Diagnosis not present

## 2018-02-19 DIAGNOSIS — N3281 Overactive bladder: Secondary | ICD-10-CM | POA: Diagnosis not present

## 2018-02-19 DIAGNOSIS — J939 Pneumothorax, unspecified: Secondary | ICD-10-CM | POA: Diagnosis not present

## 2018-02-19 DIAGNOSIS — F339 Major depressive disorder, recurrent, unspecified: Secondary | ICD-10-CM | POA: Diagnosis not present

## 2018-02-19 DIAGNOSIS — E039 Hypothyroidism, unspecified: Secondary | ICD-10-CM | POA: Diagnosis not present

## 2018-02-19 DIAGNOSIS — M059 Rheumatoid arthritis with rheumatoid factor, unspecified: Secondary | ICD-10-CM | POA: Diagnosis not present

## 2018-02-21 DIAGNOSIS — J939 Pneumothorax, unspecified: Secondary | ICD-10-CM | POA: Diagnosis not present

## 2018-02-21 DIAGNOSIS — M059 Rheumatoid arthritis with rheumatoid factor, unspecified: Secondary | ICD-10-CM | POA: Diagnosis not present

## 2018-02-21 DIAGNOSIS — F339 Major depressive disorder, recurrent, unspecified: Secondary | ICD-10-CM | POA: Diagnosis not present

## 2018-02-21 DIAGNOSIS — J189 Pneumonia, unspecified organism: Secondary | ICD-10-CM | POA: Diagnosis not present

## 2018-02-21 DIAGNOSIS — I5030 Unspecified diastolic (congestive) heart failure: Secondary | ICD-10-CM | POA: Diagnosis not present

## 2018-02-21 DIAGNOSIS — I10 Essential (primary) hypertension: Secondary | ICD-10-CM | POA: Diagnosis not present

## 2018-02-24 DIAGNOSIS — J939 Pneumothorax, unspecified: Secondary | ICD-10-CM | POA: Diagnosis not present

## 2018-02-24 DIAGNOSIS — I10 Essential (primary) hypertension: Secondary | ICD-10-CM | POA: Diagnosis not present

## 2018-02-24 DIAGNOSIS — F339 Major depressive disorder, recurrent, unspecified: Secondary | ICD-10-CM | POA: Diagnosis not present

## 2018-02-24 DIAGNOSIS — J189 Pneumonia, unspecified organism: Secondary | ICD-10-CM | POA: Diagnosis not present

## 2018-02-24 DIAGNOSIS — M059 Rheumatoid arthritis with rheumatoid factor, unspecified: Secondary | ICD-10-CM | POA: Diagnosis not present

## 2018-02-24 DIAGNOSIS — I5030 Unspecified diastolic (congestive) heart failure: Secondary | ICD-10-CM | POA: Diagnosis not present

## 2018-02-27 DIAGNOSIS — M059 Rheumatoid arthritis with rheumatoid factor, unspecified: Secondary | ICD-10-CM | POA: Diagnosis not present

## 2018-02-27 DIAGNOSIS — F339 Major depressive disorder, recurrent, unspecified: Secondary | ICD-10-CM | POA: Diagnosis not present

## 2018-02-27 DIAGNOSIS — I10 Essential (primary) hypertension: Secondary | ICD-10-CM | POA: Diagnosis not present

## 2018-02-27 DIAGNOSIS — I5030 Unspecified diastolic (congestive) heart failure: Secondary | ICD-10-CM | POA: Diagnosis not present

## 2018-02-27 DIAGNOSIS — J939 Pneumothorax, unspecified: Secondary | ICD-10-CM | POA: Diagnosis not present

## 2018-02-27 DIAGNOSIS — J189 Pneumonia, unspecified organism: Secondary | ICD-10-CM | POA: Diagnosis not present

## 2018-02-28 DIAGNOSIS — M059 Rheumatoid arthritis with rheumatoid factor, unspecified: Secondary | ICD-10-CM | POA: Diagnosis not present

## 2018-02-28 DIAGNOSIS — I10 Essential (primary) hypertension: Secondary | ICD-10-CM | POA: Diagnosis not present

## 2018-02-28 DIAGNOSIS — F339 Major depressive disorder, recurrent, unspecified: Secondary | ICD-10-CM | POA: Diagnosis not present

## 2018-02-28 DIAGNOSIS — J939 Pneumothorax, unspecified: Secondary | ICD-10-CM | POA: Diagnosis not present

## 2018-02-28 DIAGNOSIS — I5030 Unspecified diastolic (congestive) heart failure: Secondary | ICD-10-CM | POA: Diagnosis not present

## 2018-02-28 DIAGNOSIS — J189 Pneumonia, unspecified organism: Secondary | ICD-10-CM | POA: Diagnosis not present

## 2018-03-01 DIAGNOSIS — J189 Pneumonia, unspecified organism: Secondary | ICD-10-CM | POA: Diagnosis not present

## 2018-03-01 DIAGNOSIS — J939 Pneumothorax, unspecified: Secondary | ICD-10-CM | POA: Diagnosis not present

## 2018-03-01 DIAGNOSIS — I10 Essential (primary) hypertension: Secondary | ICD-10-CM | POA: Diagnosis not present

## 2018-03-01 DIAGNOSIS — I5030 Unspecified diastolic (congestive) heart failure: Secondary | ICD-10-CM | POA: Diagnosis not present

## 2018-03-01 DIAGNOSIS — F339 Major depressive disorder, recurrent, unspecified: Secondary | ICD-10-CM | POA: Diagnosis not present

## 2018-03-01 DIAGNOSIS — M059 Rheumatoid arthritis with rheumatoid factor, unspecified: Secondary | ICD-10-CM | POA: Diagnosis not present

## 2018-03-04 DIAGNOSIS — F339 Major depressive disorder, recurrent, unspecified: Secondary | ICD-10-CM | POA: Diagnosis not present

## 2018-03-04 DIAGNOSIS — I10 Essential (primary) hypertension: Secondary | ICD-10-CM | POA: Diagnosis not present

## 2018-03-04 DIAGNOSIS — M059 Rheumatoid arthritis with rheumatoid factor, unspecified: Secondary | ICD-10-CM | POA: Diagnosis not present

## 2018-03-04 DIAGNOSIS — I5030 Unspecified diastolic (congestive) heart failure: Secondary | ICD-10-CM | POA: Diagnosis not present

## 2018-03-04 DIAGNOSIS — J189 Pneumonia, unspecified organism: Secondary | ICD-10-CM | POA: Diagnosis not present

## 2018-03-04 DIAGNOSIS — J939 Pneumothorax, unspecified: Secondary | ICD-10-CM | POA: Diagnosis not present

## 2018-03-07 DIAGNOSIS — F339 Major depressive disorder, recurrent, unspecified: Secondary | ICD-10-CM | POA: Diagnosis not present

## 2018-03-07 DIAGNOSIS — J939 Pneumothorax, unspecified: Secondary | ICD-10-CM | POA: Diagnosis not present

## 2018-03-07 DIAGNOSIS — M059 Rheumatoid arthritis with rheumatoid factor, unspecified: Secondary | ICD-10-CM | POA: Diagnosis not present

## 2018-03-07 DIAGNOSIS — J189 Pneumonia, unspecified organism: Secondary | ICD-10-CM | POA: Diagnosis not present

## 2018-03-07 DIAGNOSIS — I5030 Unspecified diastolic (congestive) heart failure: Secondary | ICD-10-CM | POA: Diagnosis not present

## 2018-03-07 DIAGNOSIS — I10 Essential (primary) hypertension: Secondary | ICD-10-CM | POA: Diagnosis not present

## 2018-03-11 DIAGNOSIS — I5030 Unspecified diastolic (congestive) heart failure: Secondary | ICD-10-CM | POA: Diagnosis not present

## 2018-03-11 DIAGNOSIS — J189 Pneumonia, unspecified organism: Secondary | ICD-10-CM | POA: Diagnosis not present

## 2018-03-11 DIAGNOSIS — I10 Essential (primary) hypertension: Secondary | ICD-10-CM | POA: Diagnosis not present

## 2018-03-11 DIAGNOSIS — J939 Pneumothorax, unspecified: Secondary | ICD-10-CM | POA: Diagnosis not present

## 2018-03-11 DIAGNOSIS — F339 Major depressive disorder, recurrent, unspecified: Secondary | ICD-10-CM | POA: Diagnosis not present

## 2018-03-11 DIAGNOSIS — M059 Rheumatoid arthritis with rheumatoid factor, unspecified: Secondary | ICD-10-CM | POA: Diagnosis not present

## 2018-03-14 DIAGNOSIS — M059 Rheumatoid arthritis with rheumatoid factor, unspecified: Secondary | ICD-10-CM | POA: Diagnosis not present

## 2018-03-14 DIAGNOSIS — I5030 Unspecified diastolic (congestive) heart failure: Secondary | ICD-10-CM | POA: Diagnosis not present

## 2018-03-14 DIAGNOSIS — F339 Major depressive disorder, recurrent, unspecified: Secondary | ICD-10-CM | POA: Diagnosis not present

## 2018-03-14 DIAGNOSIS — J189 Pneumonia, unspecified organism: Secondary | ICD-10-CM | POA: Diagnosis not present

## 2018-03-14 DIAGNOSIS — J939 Pneumothorax, unspecified: Secondary | ICD-10-CM | POA: Diagnosis not present

## 2018-03-14 DIAGNOSIS — I10 Essential (primary) hypertension: Secondary | ICD-10-CM | POA: Diagnosis not present

## 2018-03-19 DIAGNOSIS — F339 Major depressive disorder, recurrent, unspecified: Secondary | ICD-10-CM | POA: Diagnosis not present

## 2018-03-19 DIAGNOSIS — I10 Essential (primary) hypertension: Secondary | ICD-10-CM | POA: Diagnosis not present

## 2018-03-19 DIAGNOSIS — M059 Rheumatoid arthritis with rheumatoid factor, unspecified: Secondary | ICD-10-CM | POA: Diagnosis not present

## 2018-03-19 DIAGNOSIS — I5030 Unspecified diastolic (congestive) heart failure: Secondary | ICD-10-CM | POA: Diagnosis not present

## 2018-03-19 DIAGNOSIS — J189 Pneumonia, unspecified organism: Secondary | ICD-10-CM | POA: Diagnosis not present

## 2018-03-19 DIAGNOSIS — J939 Pneumothorax, unspecified: Secondary | ICD-10-CM | POA: Diagnosis not present

## 2018-03-21 DIAGNOSIS — I5030 Unspecified diastolic (congestive) heart failure: Secondary | ICD-10-CM | POA: Diagnosis not present

## 2018-03-21 DIAGNOSIS — J939 Pneumothorax, unspecified: Secondary | ICD-10-CM | POA: Diagnosis not present

## 2018-03-21 DIAGNOSIS — J189 Pneumonia, unspecified organism: Secondary | ICD-10-CM | POA: Diagnosis not present

## 2018-03-21 DIAGNOSIS — M059 Rheumatoid arthritis with rheumatoid factor, unspecified: Secondary | ICD-10-CM | POA: Diagnosis not present

## 2018-03-21 DIAGNOSIS — F339 Major depressive disorder, recurrent, unspecified: Secondary | ICD-10-CM | POA: Diagnosis not present

## 2018-03-21 DIAGNOSIS — I10 Essential (primary) hypertension: Secondary | ICD-10-CM | POA: Diagnosis not present

## 2018-03-22 DIAGNOSIS — N3281 Overactive bladder: Secondary | ICD-10-CM | POA: Diagnosis not present

## 2018-03-22 DIAGNOSIS — I5030 Unspecified diastolic (congestive) heart failure: Secondary | ICD-10-CM | POA: Diagnosis not present

## 2018-03-22 DIAGNOSIS — K219 Gastro-esophageal reflux disease without esophagitis: Secondary | ICD-10-CM | POA: Diagnosis not present

## 2018-03-22 DIAGNOSIS — J189 Pneumonia, unspecified organism: Secondary | ICD-10-CM | POA: Diagnosis not present

## 2018-03-22 DIAGNOSIS — I10 Essential (primary) hypertension: Secondary | ICD-10-CM | POA: Diagnosis not present

## 2018-03-22 DIAGNOSIS — J939 Pneumothorax, unspecified: Secondary | ICD-10-CM | POA: Diagnosis not present

## 2018-03-22 DIAGNOSIS — F339 Major depressive disorder, recurrent, unspecified: Secondary | ICD-10-CM | POA: Diagnosis not present

## 2018-03-22 DIAGNOSIS — M059 Rheumatoid arthritis with rheumatoid factor, unspecified: Secondary | ICD-10-CM | POA: Diagnosis not present

## 2018-03-22 DIAGNOSIS — E039 Hypothyroidism, unspecified: Secondary | ICD-10-CM | POA: Diagnosis not present

## 2018-03-24 DIAGNOSIS — I5030 Unspecified diastolic (congestive) heart failure: Secondary | ICD-10-CM | POA: Diagnosis not present

## 2018-03-24 DIAGNOSIS — I10 Essential (primary) hypertension: Secondary | ICD-10-CM | POA: Diagnosis not present

## 2018-03-24 DIAGNOSIS — J939 Pneumothorax, unspecified: Secondary | ICD-10-CM | POA: Diagnosis not present

## 2018-03-24 DIAGNOSIS — J189 Pneumonia, unspecified organism: Secondary | ICD-10-CM | POA: Diagnosis not present

## 2018-03-24 DIAGNOSIS — F339 Major depressive disorder, recurrent, unspecified: Secondary | ICD-10-CM | POA: Diagnosis not present

## 2018-03-24 DIAGNOSIS — M059 Rheumatoid arthritis with rheumatoid factor, unspecified: Secondary | ICD-10-CM | POA: Diagnosis not present

## 2018-03-28 DIAGNOSIS — J189 Pneumonia, unspecified organism: Secondary | ICD-10-CM | POA: Diagnosis not present

## 2018-03-28 DIAGNOSIS — M059 Rheumatoid arthritis with rheumatoid factor, unspecified: Secondary | ICD-10-CM | POA: Diagnosis not present

## 2018-03-28 DIAGNOSIS — F339 Major depressive disorder, recurrent, unspecified: Secondary | ICD-10-CM | POA: Diagnosis not present

## 2018-03-28 DIAGNOSIS — I5030 Unspecified diastolic (congestive) heart failure: Secondary | ICD-10-CM | POA: Diagnosis not present

## 2018-03-28 DIAGNOSIS — J939 Pneumothorax, unspecified: Secondary | ICD-10-CM | POA: Diagnosis not present

## 2018-03-28 DIAGNOSIS — I10 Essential (primary) hypertension: Secondary | ICD-10-CM | POA: Diagnosis not present

## 2018-03-29 DIAGNOSIS — M059 Rheumatoid arthritis with rheumatoid factor, unspecified: Secondary | ICD-10-CM | POA: Diagnosis not present

## 2018-03-29 DIAGNOSIS — F339 Major depressive disorder, recurrent, unspecified: Secondary | ICD-10-CM | POA: Diagnosis not present

## 2018-03-29 DIAGNOSIS — J939 Pneumothorax, unspecified: Secondary | ICD-10-CM | POA: Diagnosis not present

## 2018-03-29 DIAGNOSIS — I5030 Unspecified diastolic (congestive) heart failure: Secondary | ICD-10-CM | POA: Diagnosis not present

## 2018-03-29 DIAGNOSIS — I10 Essential (primary) hypertension: Secondary | ICD-10-CM | POA: Diagnosis not present

## 2018-03-29 DIAGNOSIS — J189 Pneumonia, unspecified organism: Secondary | ICD-10-CM | POA: Diagnosis not present

## 2018-03-31 DIAGNOSIS — I5030 Unspecified diastolic (congestive) heart failure: Secondary | ICD-10-CM | POA: Diagnosis not present

## 2018-03-31 DIAGNOSIS — I10 Essential (primary) hypertension: Secondary | ICD-10-CM | POA: Diagnosis not present

## 2018-03-31 DIAGNOSIS — F339 Major depressive disorder, recurrent, unspecified: Secondary | ICD-10-CM | POA: Diagnosis not present

## 2018-03-31 DIAGNOSIS — M059 Rheumatoid arthritis with rheumatoid factor, unspecified: Secondary | ICD-10-CM | POA: Diagnosis not present

## 2018-03-31 DIAGNOSIS — J939 Pneumothorax, unspecified: Secondary | ICD-10-CM | POA: Diagnosis not present

## 2018-03-31 DIAGNOSIS — J189 Pneumonia, unspecified organism: Secondary | ICD-10-CM | POA: Diagnosis not present

## 2018-04-01 DIAGNOSIS — F339 Major depressive disorder, recurrent, unspecified: Secondary | ICD-10-CM | POA: Diagnosis not present

## 2018-04-01 DIAGNOSIS — I10 Essential (primary) hypertension: Secondary | ICD-10-CM | POA: Diagnosis not present

## 2018-04-01 DIAGNOSIS — I5030 Unspecified diastolic (congestive) heart failure: Secondary | ICD-10-CM | POA: Diagnosis not present

## 2018-04-01 DIAGNOSIS — J189 Pneumonia, unspecified organism: Secondary | ICD-10-CM | POA: Diagnosis not present

## 2018-04-01 DIAGNOSIS — M059 Rheumatoid arthritis with rheumatoid factor, unspecified: Secondary | ICD-10-CM | POA: Diagnosis not present

## 2018-04-01 DIAGNOSIS — J939 Pneumothorax, unspecified: Secondary | ICD-10-CM | POA: Diagnosis not present

## 2018-04-04 DIAGNOSIS — J189 Pneumonia, unspecified organism: Secondary | ICD-10-CM | POA: Diagnosis not present

## 2018-04-04 DIAGNOSIS — M059 Rheumatoid arthritis with rheumatoid factor, unspecified: Secondary | ICD-10-CM | POA: Diagnosis not present

## 2018-04-04 DIAGNOSIS — I5030 Unspecified diastolic (congestive) heart failure: Secondary | ICD-10-CM | POA: Diagnosis not present

## 2018-04-04 DIAGNOSIS — F339 Major depressive disorder, recurrent, unspecified: Secondary | ICD-10-CM | POA: Diagnosis not present

## 2018-04-04 DIAGNOSIS — I10 Essential (primary) hypertension: Secondary | ICD-10-CM | POA: Diagnosis not present

## 2018-04-04 DIAGNOSIS — J939 Pneumothorax, unspecified: Secondary | ICD-10-CM | POA: Diagnosis not present

## 2018-04-07 DIAGNOSIS — J189 Pneumonia, unspecified organism: Secondary | ICD-10-CM | POA: Diagnosis not present

## 2018-04-07 DIAGNOSIS — I10 Essential (primary) hypertension: Secondary | ICD-10-CM | POA: Diagnosis not present

## 2018-04-07 DIAGNOSIS — J939 Pneumothorax, unspecified: Secondary | ICD-10-CM | POA: Diagnosis not present

## 2018-04-07 DIAGNOSIS — I5030 Unspecified diastolic (congestive) heart failure: Secondary | ICD-10-CM | POA: Diagnosis not present

## 2018-04-07 DIAGNOSIS — M059 Rheumatoid arthritis with rheumatoid factor, unspecified: Secondary | ICD-10-CM | POA: Diagnosis not present

## 2018-04-07 DIAGNOSIS — F339 Major depressive disorder, recurrent, unspecified: Secondary | ICD-10-CM | POA: Diagnosis not present

## 2018-04-08 DIAGNOSIS — J189 Pneumonia, unspecified organism: Secondary | ICD-10-CM | POA: Diagnosis not present

## 2018-04-08 DIAGNOSIS — F339 Major depressive disorder, recurrent, unspecified: Secondary | ICD-10-CM | POA: Diagnosis not present

## 2018-04-08 DIAGNOSIS — I10 Essential (primary) hypertension: Secondary | ICD-10-CM | POA: Diagnosis not present

## 2018-04-08 DIAGNOSIS — I5030 Unspecified diastolic (congestive) heart failure: Secondary | ICD-10-CM | POA: Diagnosis not present

## 2018-04-08 DIAGNOSIS — J939 Pneumothorax, unspecified: Secondary | ICD-10-CM | POA: Diagnosis not present

## 2018-04-08 DIAGNOSIS — M059 Rheumatoid arthritis with rheumatoid factor, unspecified: Secondary | ICD-10-CM | POA: Diagnosis not present

## 2018-04-11 DIAGNOSIS — M059 Rheumatoid arthritis with rheumatoid factor, unspecified: Secondary | ICD-10-CM | POA: Diagnosis not present

## 2018-04-11 DIAGNOSIS — I5030 Unspecified diastolic (congestive) heart failure: Secondary | ICD-10-CM | POA: Diagnosis not present

## 2018-04-11 DIAGNOSIS — J939 Pneumothorax, unspecified: Secondary | ICD-10-CM | POA: Diagnosis not present

## 2018-04-11 DIAGNOSIS — F339 Major depressive disorder, recurrent, unspecified: Secondary | ICD-10-CM | POA: Diagnosis not present

## 2018-04-11 DIAGNOSIS — J189 Pneumonia, unspecified organism: Secondary | ICD-10-CM | POA: Diagnosis not present

## 2018-04-11 DIAGNOSIS — I10 Essential (primary) hypertension: Secondary | ICD-10-CM | POA: Diagnosis not present

## 2018-04-14 DIAGNOSIS — I5030 Unspecified diastolic (congestive) heart failure: Secondary | ICD-10-CM | POA: Diagnosis not present

## 2018-04-14 DIAGNOSIS — F339 Major depressive disorder, recurrent, unspecified: Secondary | ICD-10-CM | POA: Diagnosis not present

## 2018-04-14 DIAGNOSIS — J189 Pneumonia, unspecified organism: Secondary | ICD-10-CM | POA: Diagnosis not present

## 2018-04-14 DIAGNOSIS — J939 Pneumothorax, unspecified: Secondary | ICD-10-CM | POA: Diagnosis not present

## 2018-04-14 DIAGNOSIS — I10 Essential (primary) hypertension: Secondary | ICD-10-CM | POA: Diagnosis not present

## 2018-04-14 DIAGNOSIS — M059 Rheumatoid arthritis with rheumatoid factor, unspecified: Secondary | ICD-10-CM | POA: Diagnosis not present

## 2018-04-17 DIAGNOSIS — J189 Pneumonia, unspecified organism: Secondary | ICD-10-CM | POA: Diagnosis not present

## 2018-04-17 DIAGNOSIS — I10 Essential (primary) hypertension: Secondary | ICD-10-CM | POA: Diagnosis not present

## 2018-04-17 DIAGNOSIS — F339 Major depressive disorder, recurrent, unspecified: Secondary | ICD-10-CM | POA: Diagnosis not present

## 2018-04-17 DIAGNOSIS — I5030 Unspecified diastolic (congestive) heart failure: Secondary | ICD-10-CM | POA: Diagnosis not present

## 2018-04-17 DIAGNOSIS — M059 Rheumatoid arthritis with rheumatoid factor, unspecified: Secondary | ICD-10-CM | POA: Diagnosis not present

## 2018-04-17 DIAGNOSIS — J939 Pneumothorax, unspecified: Secondary | ICD-10-CM | POA: Diagnosis not present

## 2018-04-18 DIAGNOSIS — F339 Major depressive disorder, recurrent, unspecified: Secondary | ICD-10-CM | POA: Diagnosis not present

## 2018-04-18 DIAGNOSIS — I5030 Unspecified diastolic (congestive) heart failure: Secondary | ICD-10-CM | POA: Diagnosis not present

## 2018-04-18 DIAGNOSIS — J189 Pneumonia, unspecified organism: Secondary | ICD-10-CM | POA: Diagnosis not present

## 2018-04-18 DIAGNOSIS — J939 Pneumothorax, unspecified: Secondary | ICD-10-CM | POA: Diagnosis not present

## 2018-04-18 DIAGNOSIS — M059 Rheumatoid arthritis with rheumatoid factor, unspecified: Secondary | ICD-10-CM | POA: Diagnosis not present

## 2018-04-18 DIAGNOSIS — I10 Essential (primary) hypertension: Secondary | ICD-10-CM | POA: Diagnosis not present

## 2018-04-21 DIAGNOSIS — K219 Gastro-esophageal reflux disease without esophagitis: Secondary | ICD-10-CM | POA: Diagnosis not present

## 2018-04-21 DIAGNOSIS — F339 Major depressive disorder, recurrent, unspecified: Secondary | ICD-10-CM | POA: Diagnosis not present

## 2018-04-21 DIAGNOSIS — I5030 Unspecified diastolic (congestive) heart failure: Secondary | ICD-10-CM | POA: Diagnosis not present

## 2018-04-21 DIAGNOSIS — J189 Pneumonia, unspecified organism: Secondary | ICD-10-CM | POA: Diagnosis not present

## 2018-04-21 DIAGNOSIS — J939 Pneumothorax, unspecified: Secondary | ICD-10-CM | POA: Diagnosis not present

## 2018-04-21 DIAGNOSIS — E039 Hypothyroidism, unspecified: Secondary | ICD-10-CM | POA: Diagnosis not present

## 2018-04-21 DIAGNOSIS — N3281 Overactive bladder: Secondary | ICD-10-CM | POA: Diagnosis not present

## 2018-04-21 DIAGNOSIS — M059 Rheumatoid arthritis with rheumatoid factor, unspecified: Secondary | ICD-10-CM | POA: Diagnosis not present

## 2018-04-21 DIAGNOSIS — I10 Essential (primary) hypertension: Secondary | ICD-10-CM | POA: Diagnosis not present

## 2018-04-22 DIAGNOSIS — M059 Rheumatoid arthritis with rheumatoid factor, unspecified: Secondary | ICD-10-CM | POA: Diagnosis not present

## 2018-04-22 DIAGNOSIS — J189 Pneumonia, unspecified organism: Secondary | ICD-10-CM | POA: Diagnosis not present

## 2018-04-22 DIAGNOSIS — J939 Pneumothorax, unspecified: Secondary | ICD-10-CM | POA: Diagnosis not present

## 2018-04-22 DIAGNOSIS — F339 Major depressive disorder, recurrent, unspecified: Secondary | ICD-10-CM | POA: Diagnosis not present

## 2018-04-22 DIAGNOSIS — I5030 Unspecified diastolic (congestive) heart failure: Secondary | ICD-10-CM | POA: Diagnosis not present

## 2018-04-22 DIAGNOSIS — I10 Essential (primary) hypertension: Secondary | ICD-10-CM | POA: Diagnosis not present

## 2018-04-25 DIAGNOSIS — F339 Major depressive disorder, recurrent, unspecified: Secondary | ICD-10-CM | POA: Diagnosis not present

## 2018-04-25 DIAGNOSIS — M059 Rheumatoid arthritis with rheumatoid factor, unspecified: Secondary | ICD-10-CM | POA: Diagnosis not present

## 2018-04-25 DIAGNOSIS — I10 Essential (primary) hypertension: Secondary | ICD-10-CM | POA: Diagnosis not present

## 2018-04-25 DIAGNOSIS — J939 Pneumothorax, unspecified: Secondary | ICD-10-CM | POA: Diagnosis not present

## 2018-04-25 DIAGNOSIS — J189 Pneumonia, unspecified organism: Secondary | ICD-10-CM | POA: Diagnosis not present

## 2018-04-25 DIAGNOSIS — I5030 Unspecified diastolic (congestive) heart failure: Secondary | ICD-10-CM | POA: Diagnosis not present

## 2018-04-28 DIAGNOSIS — I5030 Unspecified diastolic (congestive) heart failure: Secondary | ICD-10-CM | POA: Diagnosis not present

## 2018-04-28 DIAGNOSIS — M059 Rheumatoid arthritis with rheumatoid factor, unspecified: Secondary | ICD-10-CM | POA: Diagnosis not present

## 2018-04-28 DIAGNOSIS — N390 Urinary tract infection, site not specified: Secondary | ICD-10-CM | POA: Diagnosis not present

## 2018-04-28 DIAGNOSIS — J939 Pneumothorax, unspecified: Secondary | ICD-10-CM | POA: Diagnosis not present

## 2018-04-28 DIAGNOSIS — J189 Pneumonia, unspecified organism: Secondary | ICD-10-CM | POA: Diagnosis not present

## 2018-04-28 DIAGNOSIS — F339 Major depressive disorder, recurrent, unspecified: Secondary | ICD-10-CM | POA: Diagnosis not present

## 2018-04-28 DIAGNOSIS — R300 Dysuria: Secondary | ICD-10-CM | POA: Diagnosis not present

## 2018-04-28 DIAGNOSIS — I10 Essential (primary) hypertension: Secondary | ICD-10-CM | POA: Diagnosis not present

## 2018-04-30 DIAGNOSIS — J189 Pneumonia, unspecified organism: Secondary | ICD-10-CM | POA: Diagnosis not present

## 2018-04-30 DIAGNOSIS — I10 Essential (primary) hypertension: Secondary | ICD-10-CM | POA: Diagnosis not present

## 2018-04-30 DIAGNOSIS — F339 Major depressive disorder, recurrent, unspecified: Secondary | ICD-10-CM | POA: Diagnosis not present

## 2018-04-30 DIAGNOSIS — J939 Pneumothorax, unspecified: Secondary | ICD-10-CM | POA: Diagnosis not present

## 2018-04-30 DIAGNOSIS — I5030 Unspecified diastolic (congestive) heart failure: Secondary | ICD-10-CM | POA: Diagnosis not present

## 2018-04-30 DIAGNOSIS — M059 Rheumatoid arthritis with rheumatoid factor, unspecified: Secondary | ICD-10-CM | POA: Diagnosis not present

## 2018-05-02 DIAGNOSIS — J189 Pneumonia, unspecified organism: Secondary | ICD-10-CM | POA: Diagnosis not present

## 2018-05-02 DIAGNOSIS — J939 Pneumothorax, unspecified: Secondary | ICD-10-CM | POA: Diagnosis not present

## 2018-05-02 DIAGNOSIS — I5030 Unspecified diastolic (congestive) heart failure: Secondary | ICD-10-CM | POA: Diagnosis not present

## 2018-05-02 DIAGNOSIS — I10 Essential (primary) hypertension: Secondary | ICD-10-CM | POA: Diagnosis not present

## 2018-05-02 DIAGNOSIS — F339 Major depressive disorder, recurrent, unspecified: Secondary | ICD-10-CM | POA: Diagnosis not present

## 2018-05-02 DIAGNOSIS — M059 Rheumatoid arthritis with rheumatoid factor, unspecified: Secondary | ICD-10-CM | POA: Diagnosis not present

## 2018-05-05 DIAGNOSIS — M059 Rheumatoid arthritis with rheumatoid factor, unspecified: Secondary | ICD-10-CM | POA: Diagnosis not present

## 2018-05-05 DIAGNOSIS — I5030 Unspecified diastolic (congestive) heart failure: Secondary | ICD-10-CM | POA: Diagnosis not present

## 2018-05-05 DIAGNOSIS — F339 Major depressive disorder, recurrent, unspecified: Secondary | ICD-10-CM | POA: Diagnosis not present

## 2018-05-05 DIAGNOSIS — I10 Essential (primary) hypertension: Secondary | ICD-10-CM | POA: Diagnosis not present

## 2018-05-05 DIAGNOSIS — J189 Pneumonia, unspecified organism: Secondary | ICD-10-CM | POA: Diagnosis not present

## 2018-05-05 DIAGNOSIS — J939 Pneumothorax, unspecified: Secondary | ICD-10-CM | POA: Diagnosis not present

## 2018-05-06 DIAGNOSIS — J189 Pneumonia, unspecified organism: Secondary | ICD-10-CM | POA: Diagnosis not present

## 2018-05-06 DIAGNOSIS — I5030 Unspecified diastolic (congestive) heart failure: Secondary | ICD-10-CM | POA: Diagnosis not present

## 2018-05-06 DIAGNOSIS — M059 Rheumatoid arthritis with rheumatoid factor, unspecified: Secondary | ICD-10-CM | POA: Diagnosis not present

## 2018-05-06 DIAGNOSIS — I10 Essential (primary) hypertension: Secondary | ICD-10-CM | POA: Diagnosis not present

## 2018-05-06 DIAGNOSIS — J939 Pneumothorax, unspecified: Secondary | ICD-10-CM | POA: Diagnosis not present

## 2018-05-06 DIAGNOSIS — F339 Major depressive disorder, recurrent, unspecified: Secondary | ICD-10-CM | POA: Diagnosis not present

## 2018-05-09 DIAGNOSIS — I5030 Unspecified diastolic (congestive) heart failure: Secondary | ICD-10-CM | POA: Diagnosis not present

## 2018-05-09 DIAGNOSIS — F339 Major depressive disorder, recurrent, unspecified: Secondary | ICD-10-CM | POA: Diagnosis not present

## 2018-05-09 DIAGNOSIS — J189 Pneumonia, unspecified organism: Secondary | ICD-10-CM | POA: Diagnosis not present

## 2018-05-09 DIAGNOSIS — M059 Rheumatoid arthritis with rheumatoid factor, unspecified: Secondary | ICD-10-CM | POA: Diagnosis not present

## 2018-05-09 DIAGNOSIS — J939 Pneumothorax, unspecified: Secondary | ICD-10-CM | POA: Diagnosis not present

## 2018-05-09 DIAGNOSIS — I10 Essential (primary) hypertension: Secondary | ICD-10-CM | POA: Diagnosis not present

## 2018-05-10 DIAGNOSIS — I5030 Unspecified diastolic (congestive) heart failure: Secondary | ICD-10-CM | POA: Diagnosis not present

## 2018-05-10 DIAGNOSIS — M059 Rheumatoid arthritis with rheumatoid factor, unspecified: Secondary | ICD-10-CM | POA: Diagnosis not present

## 2018-05-10 DIAGNOSIS — I10 Essential (primary) hypertension: Secondary | ICD-10-CM | POA: Diagnosis not present

## 2018-05-10 DIAGNOSIS — J939 Pneumothorax, unspecified: Secondary | ICD-10-CM | POA: Diagnosis not present

## 2018-05-10 DIAGNOSIS — F339 Major depressive disorder, recurrent, unspecified: Secondary | ICD-10-CM | POA: Diagnosis not present

## 2018-05-10 DIAGNOSIS — J189 Pneumonia, unspecified organism: Secondary | ICD-10-CM | POA: Diagnosis not present

## 2018-05-12 DIAGNOSIS — J189 Pneumonia, unspecified organism: Secondary | ICD-10-CM | POA: Diagnosis not present

## 2018-05-12 DIAGNOSIS — M059 Rheumatoid arthritis with rheumatoid factor, unspecified: Secondary | ICD-10-CM | POA: Diagnosis not present

## 2018-05-12 DIAGNOSIS — F339 Major depressive disorder, recurrent, unspecified: Secondary | ICD-10-CM | POA: Diagnosis not present

## 2018-05-12 DIAGNOSIS — J939 Pneumothorax, unspecified: Secondary | ICD-10-CM | POA: Diagnosis not present

## 2018-05-12 DIAGNOSIS — I5030 Unspecified diastolic (congestive) heart failure: Secondary | ICD-10-CM | POA: Diagnosis not present

## 2018-05-12 DIAGNOSIS — I10 Essential (primary) hypertension: Secondary | ICD-10-CM | POA: Diagnosis not present

## 2018-05-13 DIAGNOSIS — I10 Essential (primary) hypertension: Secondary | ICD-10-CM | POA: Diagnosis not present

## 2018-05-13 DIAGNOSIS — M059 Rheumatoid arthritis with rheumatoid factor, unspecified: Secondary | ICD-10-CM | POA: Diagnosis not present

## 2018-05-13 DIAGNOSIS — I5030 Unspecified diastolic (congestive) heart failure: Secondary | ICD-10-CM | POA: Diagnosis not present

## 2018-05-13 DIAGNOSIS — J939 Pneumothorax, unspecified: Secondary | ICD-10-CM | POA: Diagnosis not present

## 2018-05-13 DIAGNOSIS — J189 Pneumonia, unspecified organism: Secondary | ICD-10-CM | POA: Diagnosis not present

## 2018-05-13 DIAGNOSIS — F339 Major depressive disorder, recurrent, unspecified: Secondary | ICD-10-CM | POA: Diagnosis not present

## 2018-05-16 DIAGNOSIS — J939 Pneumothorax, unspecified: Secondary | ICD-10-CM | POA: Diagnosis not present

## 2018-05-16 DIAGNOSIS — I10 Essential (primary) hypertension: Secondary | ICD-10-CM | POA: Diagnosis not present

## 2018-05-16 DIAGNOSIS — J189 Pneumonia, unspecified organism: Secondary | ICD-10-CM | POA: Diagnosis not present

## 2018-05-16 DIAGNOSIS — F339 Major depressive disorder, recurrent, unspecified: Secondary | ICD-10-CM | POA: Diagnosis not present

## 2018-05-16 DIAGNOSIS — I5030 Unspecified diastolic (congestive) heart failure: Secondary | ICD-10-CM | POA: Diagnosis not present

## 2018-05-16 DIAGNOSIS — M059 Rheumatoid arthritis with rheumatoid factor, unspecified: Secondary | ICD-10-CM | POA: Diagnosis not present

## 2018-05-19 DIAGNOSIS — J189 Pneumonia, unspecified organism: Secondary | ICD-10-CM | POA: Diagnosis not present

## 2018-05-19 DIAGNOSIS — I10 Essential (primary) hypertension: Secondary | ICD-10-CM | POA: Diagnosis not present

## 2018-05-19 DIAGNOSIS — M059 Rheumatoid arthritis with rheumatoid factor, unspecified: Secondary | ICD-10-CM | POA: Diagnosis not present

## 2018-05-19 DIAGNOSIS — F339 Major depressive disorder, recurrent, unspecified: Secondary | ICD-10-CM | POA: Diagnosis not present

## 2018-05-19 DIAGNOSIS — J939 Pneumothorax, unspecified: Secondary | ICD-10-CM | POA: Diagnosis not present

## 2018-05-19 DIAGNOSIS — I5030 Unspecified diastolic (congestive) heart failure: Secondary | ICD-10-CM | POA: Diagnosis not present

## 2018-05-20 DIAGNOSIS — J939 Pneumothorax, unspecified: Secondary | ICD-10-CM | POA: Diagnosis not present

## 2018-05-20 DIAGNOSIS — M059 Rheumatoid arthritis with rheumatoid factor, unspecified: Secondary | ICD-10-CM | POA: Diagnosis not present

## 2018-05-20 DIAGNOSIS — J189 Pneumonia, unspecified organism: Secondary | ICD-10-CM | POA: Diagnosis not present

## 2018-05-20 DIAGNOSIS — I5030 Unspecified diastolic (congestive) heart failure: Secondary | ICD-10-CM | POA: Diagnosis not present

## 2018-05-20 DIAGNOSIS — I10 Essential (primary) hypertension: Secondary | ICD-10-CM | POA: Diagnosis not present

## 2018-05-20 DIAGNOSIS — F339 Major depressive disorder, recurrent, unspecified: Secondary | ICD-10-CM | POA: Diagnosis not present

## 2018-05-22 DIAGNOSIS — J189 Pneumonia, unspecified organism: Secondary | ICD-10-CM | POA: Diagnosis not present

## 2018-05-22 DIAGNOSIS — K219 Gastro-esophageal reflux disease without esophagitis: Secondary | ICD-10-CM | POA: Diagnosis not present

## 2018-05-22 DIAGNOSIS — M059 Rheumatoid arthritis with rheumatoid factor, unspecified: Secondary | ICD-10-CM | POA: Diagnosis not present

## 2018-05-22 DIAGNOSIS — F339 Major depressive disorder, recurrent, unspecified: Secondary | ICD-10-CM | POA: Diagnosis not present

## 2018-05-22 DIAGNOSIS — N3281 Overactive bladder: Secondary | ICD-10-CM | POA: Diagnosis not present

## 2018-05-22 DIAGNOSIS — J939 Pneumothorax, unspecified: Secondary | ICD-10-CM | POA: Diagnosis not present

## 2018-05-22 DIAGNOSIS — E039 Hypothyroidism, unspecified: Secondary | ICD-10-CM | POA: Diagnosis not present

## 2018-05-22 DIAGNOSIS — I5030 Unspecified diastolic (congestive) heart failure: Secondary | ICD-10-CM | POA: Diagnosis not present

## 2018-05-22 DIAGNOSIS — I10 Essential (primary) hypertension: Secondary | ICD-10-CM | POA: Diagnosis not present

## 2018-05-23 DIAGNOSIS — M059 Rheumatoid arthritis with rheumatoid factor, unspecified: Secondary | ICD-10-CM | POA: Diagnosis not present

## 2018-05-23 DIAGNOSIS — J189 Pneumonia, unspecified organism: Secondary | ICD-10-CM | POA: Diagnosis not present

## 2018-05-23 DIAGNOSIS — I10 Essential (primary) hypertension: Secondary | ICD-10-CM | POA: Diagnosis not present

## 2018-05-23 DIAGNOSIS — J939 Pneumothorax, unspecified: Secondary | ICD-10-CM | POA: Diagnosis not present

## 2018-05-23 DIAGNOSIS — F339 Major depressive disorder, recurrent, unspecified: Secondary | ICD-10-CM | POA: Diagnosis not present

## 2018-05-23 DIAGNOSIS — I5030 Unspecified diastolic (congestive) heart failure: Secondary | ICD-10-CM | POA: Diagnosis not present

## 2018-05-26 DIAGNOSIS — M059 Rheumatoid arthritis with rheumatoid factor, unspecified: Secondary | ICD-10-CM | POA: Diagnosis not present

## 2018-05-26 DIAGNOSIS — I10 Essential (primary) hypertension: Secondary | ICD-10-CM | POA: Diagnosis not present

## 2018-05-26 DIAGNOSIS — J189 Pneumonia, unspecified organism: Secondary | ICD-10-CM | POA: Diagnosis not present

## 2018-05-26 DIAGNOSIS — F339 Major depressive disorder, recurrent, unspecified: Secondary | ICD-10-CM | POA: Diagnosis not present

## 2018-05-26 DIAGNOSIS — I5030 Unspecified diastolic (congestive) heart failure: Secondary | ICD-10-CM | POA: Diagnosis not present

## 2018-05-26 DIAGNOSIS — J939 Pneumothorax, unspecified: Secondary | ICD-10-CM | POA: Diagnosis not present

## 2018-05-27 DIAGNOSIS — M059 Rheumatoid arthritis with rheumatoid factor, unspecified: Secondary | ICD-10-CM | POA: Diagnosis not present

## 2018-05-27 DIAGNOSIS — I10 Essential (primary) hypertension: Secondary | ICD-10-CM | POA: Diagnosis not present

## 2018-05-27 DIAGNOSIS — J189 Pneumonia, unspecified organism: Secondary | ICD-10-CM | POA: Diagnosis not present

## 2018-05-27 DIAGNOSIS — I5030 Unspecified diastolic (congestive) heart failure: Secondary | ICD-10-CM | POA: Diagnosis not present

## 2018-05-27 DIAGNOSIS — F339 Major depressive disorder, recurrent, unspecified: Secondary | ICD-10-CM | POA: Diagnosis not present

## 2018-05-27 DIAGNOSIS — J939 Pneumothorax, unspecified: Secondary | ICD-10-CM | POA: Diagnosis not present

## 2018-05-30 DIAGNOSIS — M059 Rheumatoid arthritis with rheumatoid factor, unspecified: Secondary | ICD-10-CM | POA: Diagnosis not present

## 2018-05-30 DIAGNOSIS — J189 Pneumonia, unspecified organism: Secondary | ICD-10-CM | POA: Diagnosis not present

## 2018-05-30 DIAGNOSIS — I5030 Unspecified diastolic (congestive) heart failure: Secondary | ICD-10-CM | POA: Diagnosis not present

## 2018-05-30 DIAGNOSIS — J939 Pneumothorax, unspecified: Secondary | ICD-10-CM | POA: Diagnosis not present

## 2018-05-30 DIAGNOSIS — F339 Major depressive disorder, recurrent, unspecified: Secondary | ICD-10-CM | POA: Diagnosis not present

## 2018-05-30 DIAGNOSIS — I10 Essential (primary) hypertension: Secondary | ICD-10-CM | POA: Diagnosis not present

## 2018-06-02 DIAGNOSIS — F339 Major depressive disorder, recurrent, unspecified: Secondary | ICD-10-CM | POA: Diagnosis not present

## 2018-06-02 DIAGNOSIS — J189 Pneumonia, unspecified organism: Secondary | ICD-10-CM | POA: Diagnosis not present

## 2018-06-02 DIAGNOSIS — M059 Rheumatoid arthritis with rheumatoid factor, unspecified: Secondary | ICD-10-CM | POA: Diagnosis not present

## 2018-06-02 DIAGNOSIS — I10 Essential (primary) hypertension: Secondary | ICD-10-CM | POA: Diagnosis not present

## 2018-06-02 DIAGNOSIS — J939 Pneumothorax, unspecified: Secondary | ICD-10-CM | POA: Diagnosis not present

## 2018-06-02 DIAGNOSIS — I5030 Unspecified diastolic (congestive) heart failure: Secondary | ICD-10-CM | POA: Diagnosis not present

## 2018-06-03 DIAGNOSIS — I10 Essential (primary) hypertension: Secondary | ICD-10-CM | POA: Diagnosis not present

## 2018-06-03 DIAGNOSIS — J939 Pneumothorax, unspecified: Secondary | ICD-10-CM | POA: Diagnosis not present

## 2018-06-03 DIAGNOSIS — I5030 Unspecified diastolic (congestive) heart failure: Secondary | ICD-10-CM | POA: Diagnosis not present

## 2018-06-03 DIAGNOSIS — M059 Rheumatoid arthritis with rheumatoid factor, unspecified: Secondary | ICD-10-CM | POA: Diagnosis not present

## 2018-06-03 DIAGNOSIS — J189 Pneumonia, unspecified organism: Secondary | ICD-10-CM | POA: Diagnosis not present

## 2018-06-03 DIAGNOSIS — F339 Major depressive disorder, recurrent, unspecified: Secondary | ICD-10-CM | POA: Diagnosis not present

## 2018-06-06 DIAGNOSIS — I10 Essential (primary) hypertension: Secondary | ICD-10-CM | POA: Diagnosis not present

## 2018-06-06 DIAGNOSIS — J189 Pneumonia, unspecified organism: Secondary | ICD-10-CM | POA: Diagnosis not present

## 2018-06-06 DIAGNOSIS — M059 Rheumatoid arthritis with rheumatoid factor, unspecified: Secondary | ICD-10-CM | POA: Diagnosis not present

## 2018-06-06 DIAGNOSIS — I5030 Unspecified diastolic (congestive) heart failure: Secondary | ICD-10-CM | POA: Diagnosis not present

## 2018-06-06 DIAGNOSIS — J939 Pneumothorax, unspecified: Secondary | ICD-10-CM | POA: Diagnosis not present

## 2018-06-06 DIAGNOSIS — F339 Major depressive disorder, recurrent, unspecified: Secondary | ICD-10-CM | POA: Diagnosis not present

## 2018-06-09 DIAGNOSIS — I5030 Unspecified diastolic (congestive) heart failure: Secondary | ICD-10-CM | POA: Diagnosis not present

## 2018-06-09 DIAGNOSIS — J939 Pneumothorax, unspecified: Secondary | ICD-10-CM | POA: Diagnosis not present

## 2018-06-09 DIAGNOSIS — F339 Major depressive disorder, recurrent, unspecified: Secondary | ICD-10-CM | POA: Diagnosis not present

## 2018-06-09 DIAGNOSIS — M059 Rheumatoid arthritis with rheumatoid factor, unspecified: Secondary | ICD-10-CM | POA: Diagnosis not present

## 2018-06-09 DIAGNOSIS — J189 Pneumonia, unspecified organism: Secondary | ICD-10-CM | POA: Diagnosis not present

## 2018-06-09 DIAGNOSIS — I10 Essential (primary) hypertension: Secondary | ICD-10-CM | POA: Diagnosis not present

## 2018-06-10 DIAGNOSIS — I5030 Unspecified diastolic (congestive) heart failure: Secondary | ICD-10-CM | POA: Diagnosis not present

## 2018-06-10 DIAGNOSIS — J189 Pneumonia, unspecified organism: Secondary | ICD-10-CM | POA: Diagnosis not present

## 2018-06-10 DIAGNOSIS — M059 Rheumatoid arthritis with rheumatoid factor, unspecified: Secondary | ICD-10-CM | POA: Diagnosis not present

## 2018-06-10 DIAGNOSIS — I10 Essential (primary) hypertension: Secondary | ICD-10-CM | POA: Diagnosis not present

## 2018-06-10 DIAGNOSIS — F339 Major depressive disorder, recurrent, unspecified: Secondary | ICD-10-CM | POA: Diagnosis not present

## 2018-06-10 DIAGNOSIS — J939 Pneumothorax, unspecified: Secondary | ICD-10-CM | POA: Diagnosis not present

## 2018-06-13 DIAGNOSIS — F339 Major depressive disorder, recurrent, unspecified: Secondary | ICD-10-CM | POA: Diagnosis not present

## 2018-06-13 DIAGNOSIS — I5030 Unspecified diastolic (congestive) heart failure: Secondary | ICD-10-CM | POA: Diagnosis not present

## 2018-06-13 DIAGNOSIS — J939 Pneumothorax, unspecified: Secondary | ICD-10-CM | POA: Diagnosis not present

## 2018-06-13 DIAGNOSIS — J189 Pneumonia, unspecified organism: Secondary | ICD-10-CM | POA: Diagnosis not present

## 2018-06-13 DIAGNOSIS — M059 Rheumatoid arthritis with rheumatoid factor, unspecified: Secondary | ICD-10-CM | POA: Diagnosis not present

## 2018-06-13 DIAGNOSIS — I10 Essential (primary) hypertension: Secondary | ICD-10-CM | POA: Diagnosis not present

## 2018-06-16 DIAGNOSIS — J189 Pneumonia, unspecified organism: Secondary | ICD-10-CM | POA: Diagnosis not present

## 2018-06-16 DIAGNOSIS — M059 Rheumatoid arthritis with rheumatoid factor, unspecified: Secondary | ICD-10-CM | POA: Diagnosis not present

## 2018-06-16 DIAGNOSIS — F339 Major depressive disorder, recurrent, unspecified: Secondary | ICD-10-CM | POA: Diagnosis not present

## 2018-06-16 DIAGNOSIS — J939 Pneumothorax, unspecified: Secondary | ICD-10-CM | POA: Diagnosis not present

## 2018-06-16 DIAGNOSIS — I10 Essential (primary) hypertension: Secondary | ICD-10-CM | POA: Diagnosis not present

## 2018-06-16 DIAGNOSIS — I5030 Unspecified diastolic (congestive) heart failure: Secondary | ICD-10-CM | POA: Diagnosis not present

## 2018-06-17 DIAGNOSIS — I5030 Unspecified diastolic (congestive) heart failure: Secondary | ICD-10-CM | POA: Diagnosis not present

## 2018-06-17 DIAGNOSIS — F339 Major depressive disorder, recurrent, unspecified: Secondary | ICD-10-CM | POA: Diagnosis not present

## 2018-06-17 DIAGNOSIS — I10 Essential (primary) hypertension: Secondary | ICD-10-CM | POA: Diagnosis not present

## 2018-06-17 DIAGNOSIS — J939 Pneumothorax, unspecified: Secondary | ICD-10-CM | POA: Diagnosis not present

## 2018-06-17 DIAGNOSIS — J189 Pneumonia, unspecified organism: Secondary | ICD-10-CM | POA: Diagnosis not present

## 2018-06-17 DIAGNOSIS — M059 Rheumatoid arthritis with rheumatoid factor, unspecified: Secondary | ICD-10-CM | POA: Diagnosis not present

## 2018-06-20 DIAGNOSIS — I10 Essential (primary) hypertension: Secondary | ICD-10-CM | POA: Diagnosis not present

## 2018-06-20 DIAGNOSIS — M059 Rheumatoid arthritis with rheumatoid factor, unspecified: Secondary | ICD-10-CM | POA: Diagnosis not present

## 2018-06-20 DIAGNOSIS — F339 Major depressive disorder, recurrent, unspecified: Secondary | ICD-10-CM | POA: Diagnosis not present

## 2018-06-20 DIAGNOSIS — I5030 Unspecified diastolic (congestive) heart failure: Secondary | ICD-10-CM | POA: Diagnosis not present

## 2018-06-20 DIAGNOSIS — J189 Pneumonia, unspecified organism: Secondary | ICD-10-CM | POA: Diagnosis not present

## 2018-06-20 DIAGNOSIS — J939 Pneumothorax, unspecified: Secondary | ICD-10-CM | POA: Diagnosis not present

## 2018-06-22 DIAGNOSIS — M059 Rheumatoid arthritis with rheumatoid factor, unspecified: Secondary | ICD-10-CM | POA: Diagnosis not present

## 2018-06-22 DIAGNOSIS — N3281 Overactive bladder: Secondary | ICD-10-CM | POA: Diagnosis not present

## 2018-06-22 DIAGNOSIS — I10 Essential (primary) hypertension: Secondary | ICD-10-CM | POA: Diagnosis not present

## 2018-06-22 DIAGNOSIS — F339 Major depressive disorder, recurrent, unspecified: Secondary | ICD-10-CM | POA: Diagnosis not present

## 2018-06-22 DIAGNOSIS — I5030 Unspecified diastolic (congestive) heart failure: Secondary | ICD-10-CM | POA: Diagnosis not present

## 2018-06-22 DIAGNOSIS — J189 Pneumonia, unspecified organism: Secondary | ICD-10-CM | POA: Diagnosis not present

## 2018-06-22 DIAGNOSIS — K219 Gastro-esophageal reflux disease without esophagitis: Secondary | ICD-10-CM | POA: Diagnosis not present

## 2018-06-22 DIAGNOSIS — E039 Hypothyroidism, unspecified: Secondary | ICD-10-CM | POA: Diagnosis not present

## 2018-06-22 DIAGNOSIS — J939 Pneumothorax, unspecified: Secondary | ICD-10-CM | POA: Diagnosis not present

## 2018-06-23 DIAGNOSIS — I5030 Unspecified diastolic (congestive) heart failure: Secondary | ICD-10-CM | POA: Diagnosis not present

## 2018-06-23 DIAGNOSIS — J189 Pneumonia, unspecified organism: Secondary | ICD-10-CM | POA: Diagnosis not present

## 2018-06-23 DIAGNOSIS — I10 Essential (primary) hypertension: Secondary | ICD-10-CM | POA: Diagnosis not present

## 2018-06-23 DIAGNOSIS — F339 Major depressive disorder, recurrent, unspecified: Secondary | ICD-10-CM | POA: Diagnosis not present

## 2018-06-23 DIAGNOSIS — M059 Rheumatoid arthritis with rheumatoid factor, unspecified: Secondary | ICD-10-CM | POA: Diagnosis not present

## 2018-06-23 DIAGNOSIS — J939 Pneumothorax, unspecified: Secondary | ICD-10-CM | POA: Diagnosis not present

## 2018-06-25 DIAGNOSIS — M059 Rheumatoid arthritis with rheumatoid factor, unspecified: Secondary | ICD-10-CM | POA: Diagnosis not present

## 2018-06-25 DIAGNOSIS — I5030 Unspecified diastolic (congestive) heart failure: Secondary | ICD-10-CM | POA: Diagnosis not present

## 2018-06-25 DIAGNOSIS — I10 Essential (primary) hypertension: Secondary | ICD-10-CM | POA: Diagnosis not present

## 2018-06-25 DIAGNOSIS — J939 Pneumothorax, unspecified: Secondary | ICD-10-CM | POA: Diagnosis not present

## 2018-06-25 DIAGNOSIS — J189 Pneumonia, unspecified organism: Secondary | ICD-10-CM | POA: Diagnosis not present

## 2018-06-25 DIAGNOSIS — F339 Major depressive disorder, recurrent, unspecified: Secondary | ICD-10-CM | POA: Diagnosis not present

## 2018-06-27 DIAGNOSIS — I5030 Unspecified diastolic (congestive) heart failure: Secondary | ICD-10-CM | POA: Diagnosis not present

## 2018-06-27 DIAGNOSIS — J189 Pneumonia, unspecified organism: Secondary | ICD-10-CM | POA: Diagnosis not present

## 2018-06-27 DIAGNOSIS — J939 Pneumothorax, unspecified: Secondary | ICD-10-CM | POA: Diagnosis not present

## 2018-06-27 DIAGNOSIS — F339 Major depressive disorder, recurrent, unspecified: Secondary | ICD-10-CM | POA: Diagnosis not present

## 2018-06-27 DIAGNOSIS — M059 Rheumatoid arthritis with rheumatoid factor, unspecified: Secondary | ICD-10-CM | POA: Diagnosis not present

## 2018-06-27 DIAGNOSIS — I10 Essential (primary) hypertension: Secondary | ICD-10-CM | POA: Diagnosis not present

## 2018-06-30 DIAGNOSIS — M059 Rheumatoid arthritis with rheumatoid factor, unspecified: Secondary | ICD-10-CM | POA: Diagnosis not present

## 2018-06-30 DIAGNOSIS — I10 Essential (primary) hypertension: Secondary | ICD-10-CM | POA: Diagnosis not present

## 2018-06-30 DIAGNOSIS — F339 Major depressive disorder, recurrent, unspecified: Secondary | ICD-10-CM | POA: Diagnosis not present

## 2018-06-30 DIAGNOSIS — J189 Pneumonia, unspecified organism: Secondary | ICD-10-CM | POA: Diagnosis not present

## 2018-06-30 DIAGNOSIS — I5030 Unspecified diastolic (congestive) heart failure: Secondary | ICD-10-CM | POA: Diagnosis not present

## 2018-06-30 DIAGNOSIS — J939 Pneumothorax, unspecified: Secondary | ICD-10-CM | POA: Diagnosis not present

## 2018-07-01 DIAGNOSIS — I10 Essential (primary) hypertension: Secondary | ICD-10-CM | POA: Diagnosis not present

## 2018-07-01 DIAGNOSIS — F339 Major depressive disorder, recurrent, unspecified: Secondary | ICD-10-CM | POA: Diagnosis not present

## 2018-07-01 DIAGNOSIS — J189 Pneumonia, unspecified organism: Secondary | ICD-10-CM | POA: Diagnosis not present

## 2018-07-01 DIAGNOSIS — J939 Pneumothorax, unspecified: Secondary | ICD-10-CM | POA: Diagnosis not present

## 2018-07-01 DIAGNOSIS — I5030 Unspecified diastolic (congestive) heart failure: Secondary | ICD-10-CM | POA: Diagnosis not present

## 2018-07-01 DIAGNOSIS — M059 Rheumatoid arthritis with rheumatoid factor, unspecified: Secondary | ICD-10-CM | POA: Diagnosis not present

## 2018-07-04 DIAGNOSIS — I10 Essential (primary) hypertension: Secondary | ICD-10-CM | POA: Diagnosis not present

## 2018-07-04 DIAGNOSIS — I5030 Unspecified diastolic (congestive) heart failure: Secondary | ICD-10-CM | POA: Diagnosis not present

## 2018-07-04 DIAGNOSIS — F339 Major depressive disorder, recurrent, unspecified: Secondary | ICD-10-CM | POA: Diagnosis not present

## 2018-07-04 DIAGNOSIS — J189 Pneumonia, unspecified organism: Secondary | ICD-10-CM | POA: Diagnosis not present

## 2018-07-04 DIAGNOSIS — M059 Rheumatoid arthritis with rheumatoid factor, unspecified: Secondary | ICD-10-CM | POA: Diagnosis not present

## 2018-07-04 DIAGNOSIS — J939 Pneumothorax, unspecified: Secondary | ICD-10-CM | POA: Diagnosis not present

## 2018-07-07 DIAGNOSIS — J939 Pneumothorax, unspecified: Secondary | ICD-10-CM | POA: Diagnosis not present

## 2018-07-07 DIAGNOSIS — I10 Essential (primary) hypertension: Secondary | ICD-10-CM | POA: Diagnosis not present

## 2018-07-07 DIAGNOSIS — M059 Rheumatoid arthritis with rheumatoid factor, unspecified: Secondary | ICD-10-CM | POA: Diagnosis not present

## 2018-07-07 DIAGNOSIS — F339 Major depressive disorder, recurrent, unspecified: Secondary | ICD-10-CM | POA: Diagnosis not present

## 2018-07-07 DIAGNOSIS — I5030 Unspecified diastolic (congestive) heart failure: Secondary | ICD-10-CM | POA: Diagnosis not present

## 2018-07-07 DIAGNOSIS — J189 Pneumonia, unspecified organism: Secondary | ICD-10-CM | POA: Diagnosis not present

## 2018-07-08 DIAGNOSIS — I5030 Unspecified diastolic (congestive) heart failure: Secondary | ICD-10-CM | POA: Diagnosis not present

## 2018-07-08 DIAGNOSIS — I10 Essential (primary) hypertension: Secondary | ICD-10-CM | POA: Diagnosis not present

## 2018-07-08 DIAGNOSIS — F339 Major depressive disorder, recurrent, unspecified: Secondary | ICD-10-CM | POA: Diagnosis not present

## 2018-07-08 DIAGNOSIS — J939 Pneumothorax, unspecified: Secondary | ICD-10-CM | POA: Diagnosis not present

## 2018-07-08 DIAGNOSIS — M059 Rheumatoid arthritis with rheumatoid factor, unspecified: Secondary | ICD-10-CM | POA: Diagnosis not present

## 2018-07-08 DIAGNOSIS — J189 Pneumonia, unspecified organism: Secondary | ICD-10-CM | POA: Diagnosis not present

## 2018-07-10 DIAGNOSIS — F339 Major depressive disorder, recurrent, unspecified: Secondary | ICD-10-CM | POA: Diagnosis not present

## 2018-07-10 DIAGNOSIS — I10 Essential (primary) hypertension: Secondary | ICD-10-CM | POA: Diagnosis not present

## 2018-07-10 DIAGNOSIS — M059 Rheumatoid arthritis with rheumatoid factor, unspecified: Secondary | ICD-10-CM | POA: Diagnosis not present

## 2018-07-10 DIAGNOSIS — I5030 Unspecified diastolic (congestive) heart failure: Secondary | ICD-10-CM | POA: Diagnosis not present

## 2018-07-10 DIAGNOSIS — J939 Pneumothorax, unspecified: Secondary | ICD-10-CM | POA: Diagnosis not present

## 2018-07-10 DIAGNOSIS — J189 Pneumonia, unspecified organism: Secondary | ICD-10-CM | POA: Diagnosis not present

## 2018-07-11 DIAGNOSIS — M059 Rheumatoid arthritis with rheumatoid factor, unspecified: Secondary | ICD-10-CM | POA: Diagnosis not present

## 2018-07-11 DIAGNOSIS — F339 Major depressive disorder, recurrent, unspecified: Secondary | ICD-10-CM | POA: Diagnosis not present

## 2018-07-11 DIAGNOSIS — J189 Pneumonia, unspecified organism: Secondary | ICD-10-CM | POA: Diagnosis not present

## 2018-07-11 DIAGNOSIS — J939 Pneumothorax, unspecified: Secondary | ICD-10-CM | POA: Diagnosis not present

## 2018-07-11 DIAGNOSIS — I10 Essential (primary) hypertension: Secondary | ICD-10-CM | POA: Diagnosis not present

## 2018-07-11 DIAGNOSIS — I5030 Unspecified diastolic (congestive) heart failure: Secondary | ICD-10-CM | POA: Diagnosis not present

## 2018-07-14 DIAGNOSIS — J939 Pneumothorax, unspecified: Secondary | ICD-10-CM | POA: Diagnosis not present

## 2018-07-14 DIAGNOSIS — F339 Major depressive disorder, recurrent, unspecified: Secondary | ICD-10-CM | POA: Diagnosis not present

## 2018-07-14 DIAGNOSIS — M059 Rheumatoid arthritis with rheumatoid factor, unspecified: Secondary | ICD-10-CM | POA: Diagnosis not present

## 2018-07-14 DIAGNOSIS — I5030 Unspecified diastolic (congestive) heart failure: Secondary | ICD-10-CM | POA: Diagnosis not present

## 2018-07-14 DIAGNOSIS — J189 Pneumonia, unspecified organism: Secondary | ICD-10-CM | POA: Diagnosis not present

## 2018-07-14 DIAGNOSIS — I10 Essential (primary) hypertension: Secondary | ICD-10-CM | POA: Diagnosis not present

## 2018-07-15 DIAGNOSIS — F339 Major depressive disorder, recurrent, unspecified: Secondary | ICD-10-CM | POA: Diagnosis not present

## 2018-07-15 DIAGNOSIS — I10 Essential (primary) hypertension: Secondary | ICD-10-CM | POA: Diagnosis not present

## 2018-07-15 DIAGNOSIS — J939 Pneumothorax, unspecified: Secondary | ICD-10-CM | POA: Diagnosis not present

## 2018-07-15 DIAGNOSIS — M059 Rheumatoid arthritis with rheumatoid factor, unspecified: Secondary | ICD-10-CM | POA: Diagnosis not present

## 2018-07-15 DIAGNOSIS — I5030 Unspecified diastolic (congestive) heart failure: Secondary | ICD-10-CM | POA: Diagnosis not present

## 2018-07-15 DIAGNOSIS — J189 Pneumonia, unspecified organism: Secondary | ICD-10-CM | POA: Diagnosis not present

## 2018-07-16 DIAGNOSIS — F339 Major depressive disorder, recurrent, unspecified: Secondary | ICD-10-CM | POA: Diagnosis not present

## 2018-07-16 DIAGNOSIS — J939 Pneumothorax, unspecified: Secondary | ICD-10-CM | POA: Diagnosis not present

## 2018-07-16 DIAGNOSIS — J189 Pneumonia, unspecified organism: Secondary | ICD-10-CM | POA: Diagnosis not present

## 2018-07-16 DIAGNOSIS — I10 Essential (primary) hypertension: Secondary | ICD-10-CM | POA: Diagnosis not present

## 2018-07-16 DIAGNOSIS — M059 Rheumatoid arthritis with rheumatoid factor, unspecified: Secondary | ICD-10-CM | POA: Diagnosis not present

## 2018-07-16 DIAGNOSIS — I5030 Unspecified diastolic (congestive) heart failure: Secondary | ICD-10-CM | POA: Diagnosis not present

## 2018-07-18 DIAGNOSIS — I10 Essential (primary) hypertension: Secondary | ICD-10-CM | POA: Diagnosis not present

## 2018-07-18 DIAGNOSIS — F339 Major depressive disorder, recurrent, unspecified: Secondary | ICD-10-CM | POA: Diagnosis not present

## 2018-07-18 DIAGNOSIS — I5030 Unspecified diastolic (congestive) heart failure: Secondary | ICD-10-CM | POA: Diagnosis not present

## 2018-07-18 DIAGNOSIS — J939 Pneumothorax, unspecified: Secondary | ICD-10-CM | POA: Diagnosis not present

## 2018-07-18 DIAGNOSIS — M059 Rheumatoid arthritis with rheumatoid factor, unspecified: Secondary | ICD-10-CM | POA: Diagnosis not present

## 2018-07-18 DIAGNOSIS — J189 Pneumonia, unspecified organism: Secondary | ICD-10-CM | POA: Diagnosis not present

## 2018-07-21 DIAGNOSIS — J189 Pneumonia, unspecified organism: Secondary | ICD-10-CM | POA: Diagnosis not present

## 2018-07-21 DIAGNOSIS — F339 Major depressive disorder, recurrent, unspecified: Secondary | ICD-10-CM | POA: Diagnosis not present

## 2018-07-21 DIAGNOSIS — I5030 Unspecified diastolic (congestive) heart failure: Secondary | ICD-10-CM | POA: Diagnosis not present

## 2018-07-21 DIAGNOSIS — J939 Pneumothorax, unspecified: Secondary | ICD-10-CM | POA: Diagnosis not present

## 2018-07-21 DIAGNOSIS — I10 Essential (primary) hypertension: Secondary | ICD-10-CM | POA: Diagnosis not present

## 2018-07-21 DIAGNOSIS — M059 Rheumatoid arthritis with rheumatoid factor, unspecified: Secondary | ICD-10-CM | POA: Diagnosis not present

## 2018-07-22 DIAGNOSIS — M059 Rheumatoid arthritis with rheumatoid factor, unspecified: Secondary | ICD-10-CM | POA: Diagnosis not present

## 2018-07-22 DIAGNOSIS — I10 Essential (primary) hypertension: Secondary | ICD-10-CM | POA: Diagnosis not present

## 2018-07-22 DIAGNOSIS — J939 Pneumothorax, unspecified: Secondary | ICD-10-CM | POA: Diagnosis not present

## 2018-07-22 DIAGNOSIS — J189 Pneumonia, unspecified organism: Secondary | ICD-10-CM | POA: Diagnosis not present

## 2018-07-22 DIAGNOSIS — K219 Gastro-esophageal reflux disease without esophagitis: Secondary | ICD-10-CM | POA: Diagnosis not present

## 2018-07-22 DIAGNOSIS — E039 Hypothyroidism, unspecified: Secondary | ICD-10-CM | POA: Diagnosis not present

## 2018-07-22 DIAGNOSIS — N3281 Overactive bladder: Secondary | ICD-10-CM | POA: Diagnosis not present

## 2018-07-22 DIAGNOSIS — F339 Major depressive disorder, recurrent, unspecified: Secondary | ICD-10-CM | POA: Diagnosis not present

## 2018-07-22 DIAGNOSIS — I5030 Unspecified diastolic (congestive) heart failure: Secondary | ICD-10-CM | POA: Diagnosis not present

## 2018-07-23 DIAGNOSIS — F339 Major depressive disorder, recurrent, unspecified: Secondary | ICD-10-CM | POA: Diagnosis not present

## 2018-07-23 DIAGNOSIS — I10 Essential (primary) hypertension: Secondary | ICD-10-CM | POA: Diagnosis not present

## 2018-07-23 DIAGNOSIS — J189 Pneumonia, unspecified organism: Secondary | ICD-10-CM | POA: Diagnosis not present

## 2018-07-23 DIAGNOSIS — M059 Rheumatoid arthritis with rheumatoid factor, unspecified: Secondary | ICD-10-CM | POA: Diagnosis not present

## 2018-07-23 DIAGNOSIS — J939 Pneumothorax, unspecified: Secondary | ICD-10-CM | POA: Diagnosis not present

## 2018-07-23 DIAGNOSIS — I5030 Unspecified diastolic (congestive) heart failure: Secondary | ICD-10-CM | POA: Diagnosis not present

## 2018-07-25 DIAGNOSIS — I5030 Unspecified diastolic (congestive) heart failure: Secondary | ICD-10-CM | POA: Diagnosis not present

## 2018-07-25 DIAGNOSIS — F339 Major depressive disorder, recurrent, unspecified: Secondary | ICD-10-CM | POA: Diagnosis not present

## 2018-07-25 DIAGNOSIS — J189 Pneumonia, unspecified organism: Secondary | ICD-10-CM | POA: Diagnosis not present

## 2018-07-25 DIAGNOSIS — M059 Rheumatoid arthritis with rheumatoid factor, unspecified: Secondary | ICD-10-CM | POA: Diagnosis not present

## 2018-07-25 DIAGNOSIS — J939 Pneumothorax, unspecified: Secondary | ICD-10-CM | POA: Diagnosis not present

## 2018-07-25 DIAGNOSIS — I10 Essential (primary) hypertension: Secondary | ICD-10-CM | POA: Diagnosis not present

## 2018-07-28 DIAGNOSIS — F339 Major depressive disorder, recurrent, unspecified: Secondary | ICD-10-CM | POA: Diagnosis not present

## 2018-07-28 DIAGNOSIS — I5030 Unspecified diastolic (congestive) heart failure: Secondary | ICD-10-CM | POA: Diagnosis not present

## 2018-07-28 DIAGNOSIS — J939 Pneumothorax, unspecified: Secondary | ICD-10-CM | POA: Diagnosis not present

## 2018-07-28 DIAGNOSIS — J189 Pneumonia, unspecified organism: Secondary | ICD-10-CM | POA: Diagnosis not present

## 2018-07-28 DIAGNOSIS — M059 Rheumatoid arthritis with rheumatoid factor, unspecified: Secondary | ICD-10-CM | POA: Diagnosis not present

## 2018-07-28 DIAGNOSIS — I10 Essential (primary) hypertension: Secondary | ICD-10-CM | POA: Diagnosis not present

## 2018-07-29 DIAGNOSIS — F339 Major depressive disorder, recurrent, unspecified: Secondary | ICD-10-CM | POA: Diagnosis not present

## 2018-07-29 DIAGNOSIS — M059 Rheumatoid arthritis with rheumatoid factor, unspecified: Secondary | ICD-10-CM | POA: Diagnosis not present

## 2018-07-29 DIAGNOSIS — J189 Pneumonia, unspecified organism: Secondary | ICD-10-CM | POA: Diagnosis not present

## 2018-07-29 DIAGNOSIS — I5030 Unspecified diastolic (congestive) heart failure: Secondary | ICD-10-CM | POA: Diagnosis not present

## 2018-07-29 DIAGNOSIS — I10 Essential (primary) hypertension: Secondary | ICD-10-CM | POA: Diagnosis not present

## 2018-07-29 DIAGNOSIS — J939 Pneumothorax, unspecified: Secondary | ICD-10-CM | POA: Diagnosis not present

## 2018-07-31 DIAGNOSIS — F339 Major depressive disorder, recurrent, unspecified: Secondary | ICD-10-CM | POA: Diagnosis not present

## 2018-07-31 DIAGNOSIS — I5030 Unspecified diastolic (congestive) heart failure: Secondary | ICD-10-CM | POA: Diagnosis not present

## 2018-07-31 DIAGNOSIS — I10 Essential (primary) hypertension: Secondary | ICD-10-CM | POA: Diagnosis not present

## 2018-07-31 DIAGNOSIS — J189 Pneumonia, unspecified organism: Secondary | ICD-10-CM | POA: Diagnosis not present

## 2018-07-31 DIAGNOSIS — J939 Pneumothorax, unspecified: Secondary | ICD-10-CM | POA: Diagnosis not present

## 2018-07-31 DIAGNOSIS — M059 Rheumatoid arthritis with rheumatoid factor, unspecified: Secondary | ICD-10-CM | POA: Diagnosis not present

## 2018-08-01 DIAGNOSIS — I10 Essential (primary) hypertension: Secondary | ICD-10-CM | POA: Diagnosis not present

## 2018-08-01 DIAGNOSIS — F339 Major depressive disorder, recurrent, unspecified: Secondary | ICD-10-CM | POA: Diagnosis not present

## 2018-08-01 DIAGNOSIS — J189 Pneumonia, unspecified organism: Secondary | ICD-10-CM | POA: Diagnosis not present

## 2018-08-01 DIAGNOSIS — I5030 Unspecified diastolic (congestive) heart failure: Secondary | ICD-10-CM | POA: Diagnosis not present

## 2018-08-01 DIAGNOSIS — J939 Pneumothorax, unspecified: Secondary | ICD-10-CM | POA: Diagnosis not present

## 2018-08-01 DIAGNOSIS — M059 Rheumatoid arthritis with rheumatoid factor, unspecified: Secondary | ICD-10-CM | POA: Diagnosis not present

## 2018-08-04 DIAGNOSIS — J189 Pneumonia, unspecified organism: Secondary | ICD-10-CM | POA: Diagnosis not present

## 2018-08-04 DIAGNOSIS — F339 Major depressive disorder, recurrent, unspecified: Secondary | ICD-10-CM | POA: Diagnosis not present

## 2018-08-04 DIAGNOSIS — I10 Essential (primary) hypertension: Secondary | ICD-10-CM | POA: Diagnosis not present

## 2018-08-04 DIAGNOSIS — M059 Rheumatoid arthritis with rheumatoid factor, unspecified: Secondary | ICD-10-CM | POA: Diagnosis not present

## 2018-08-04 DIAGNOSIS — I5030 Unspecified diastolic (congestive) heart failure: Secondary | ICD-10-CM | POA: Diagnosis not present

## 2018-08-04 DIAGNOSIS — J939 Pneumothorax, unspecified: Secondary | ICD-10-CM | POA: Diagnosis not present

## 2018-08-05 DIAGNOSIS — I10 Essential (primary) hypertension: Secondary | ICD-10-CM | POA: Diagnosis not present

## 2018-08-05 DIAGNOSIS — J939 Pneumothorax, unspecified: Secondary | ICD-10-CM | POA: Diagnosis not present

## 2018-08-05 DIAGNOSIS — I5030 Unspecified diastolic (congestive) heart failure: Secondary | ICD-10-CM | POA: Diagnosis not present

## 2018-08-05 DIAGNOSIS — J189 Pneumonia, unspecified organism: Secondary | ICD-10-CM | POA: Diagnosis not present

## 2018-08-05 DIAGNOSIS — F339 Major depressive disorder, recurrent, unspecified: Secondary | ICD-10-CM | POA: Diagnosis not present

## 2018-08-05 DIAGNOSIS — M059 Rheumatoid arthritis with rheumatoid factor, unspecified: Secondary | ICD-10-CM | POA: Diagnosis not present

## 2018-08-08 DIAGNOSIS — J189 Pneumonia, unspecified organism: Secondary | ICD-10-CM | POA: Diagnosis not present

## 2018-08-08 DIAGNOSIS — I5030 Unspecified diastolic (congestive) heart failure: Secondary | ICD-10-CM | POA: Diagnosis not present

## 2018-08-08 DIAGNOSIS — I10 Essential (primary) hypertension: Secondary | ICD-10-CM | POA: Diagnosis not present

## 2018-08-08 DIAGNOSIS — F339 Major depressive disorder, recurrent, unspecified: Secondary | ICD-10-CM | POA: Diagnosis not present

## 2018-08-08 DIAGNOSIS — J939 Pneumothorax, unspecified: Secondary | ICD-10-CM | POA: Diagnosis not present

## 2018-08-08 DIAGNOSIS — M059 Rheumatoid arthritis with rheumatoid factor, unspecified: Secondary | ICD-10-CM | POA: Diagnosis not present

## 2018-08-09 DIAGNOSIS — M059 Rheumatoid arthritis with rheumatoid factor, unspecified: Secondary | ICD-10-CM | POA: Diagnosis not present

## 2018-08-09 DIAGNOSIS — I10 Essential (primary) hypertension: Secondary | ICD-10-CM | POA: Diagnosis not present

## 2018-08-09 DIAGNOSIS — J189 Pneumonia, unspecified organism: Secondary | ICD-10-CM | POA: Diagnosis not present

## 2018-08-09 DIAGNOSIS — F339 Major depressive disorder, recurrent, unspecified: Secondary | ICD-10-CM | POA: Diagnosis not present

## 2018-08-09 DIAGNOSIS — J939 Pneumothorax, unspecified: Secondary | ICD-10-CM | POA: Diagnosis not present

## 2018-08-09 DIAGNOSIS — I5030 Unspecified diastolic (congestive) heart failure: Secondary | ICD-10-CM | POA: Diagnosis not present

## 2018-08-10 DIAGNOSIS — I5030 Unspecified diastolic (congestive) heart failure: Secondary | ICD-10-CM | POA: Diagnosis not present

## 2018-08-10 DIAGNOSIS — J939 Pneumothorax, unspecified: Secondary | ICD-10-CM | POA: Diagnosis not present

## 2018-08-10 DIAGNOSIS — I10 Essential (primary) hypertension: Secondary | ICD-10-CM | POA: Diagnosis not present

## 2018-08-10 DIAGNOSIS — M059 Rheumatoid arthritis with rheumatoid factor, unspecified: Secondary | ICD-10-CM | POA: Diagnosis not present

## 2018-08-10 DIAGNOSIS — J189 Pneumonia, unspecified organism: Secondary | ICD-10-CM | POA: Diagnosis not present

## 2018-08-10 DIAGNOSIS — F339 Major depressive disorder, recurrent, unspecified: Secondary | ICD-10-CM | POA: Diagnosis not present

## 2018-08-11 DIAGNOSIS — J939 Pneumothorax, unspecified: Secondary | ICD-10-CM | POA: Diagnosis not present

## 2018-08-11 DIAGNOSIS — J189 Pneumonia, unspecified organism: Secondary | ICD-10-CM | POA: Diagnosis not present

## 2018-08-11 DIAGNOSIS — M059 Rheumatoid arthritis with rheumatoid factor, unspecified: Secondary | ICD-10-CM | POA: Diagnosis not present

## 2018-08-11 DIAGNOSIS — I5030 Unspecified diastolic (congestive) heart failure: Secondary | ICD-10-CM | POA: Diagnosis not present

## 2018-08-11 DIAGNOSIS — F339 Major depressive disorder, recurrent, unspecified: Secondary | ICD-10-CM | POA: Diagnosis not present

## 2018-08-11 DIAGNOSIS — I10 Essential (primary) hypertension: Secondary | ICD-10-CM | POA: Diagnosis not present

## 2018-08-12 DIAGNOSIS — M059 Rheumatoid arthritis with rheumatoid factor, unspecified: Secondary | ICD-10-CM | POA: Diagnosis not present

## 2018-08-12 DIAGNOSIS — I5030 Unspecified diastolic (congestive) heart failure: Secondary | ICD-10-CM | POA: Diagnosis not present

## 2018-08-12 DIAGNOSIS — F339 Major depressive disorder, recurrent, unspecified: Secondary | ICD-10-CM | POA: Diagnosis not present

## 2018-08-12 DIAGNOSIS — J939 Pneumothorax, unspecified: Secondary | ICD-10-CM | POA: Diagnosis not present

## 2018-08-12 DIAGNOSIS — I10 Essential (primary) hypertension: Secondary | ICD-10-CM | POA: Diagnosis not present

## 2018-08-12 DIAGNOSIS — J189 Pneumonia, unspecified organism: Secondary | ICD-10-CM | POA: Diagnosis not present

## 2018-08-14 DIAGNOSIS — F339 Major depressive disorder, recurrent, unspecified: Secondary | ICD-10-CM | POA: Diagnosis not present

## 2018-08-14 DIAGNOSIS — M059 Rheumatoid arthritis with rheumatoid factor, unspecified: Secondary | ICD-10-CM | POA: Diagnosis not present

## 2018-08-14 DIAGNOSIS — I10 Essential (primary) hypertension: Secondary | ICD-10-CM | POA: Diagnosis not present

## 2018-08-14 DIAGNOSIS — J189 Pneumonia, unspecified organism: Secondary | ICD-10-CM | POA: Diagnosis not present

## 2018-08-14 DIAGNOSIS — I5030 Unspecified diastolic (congestive) heart failure: Secondary | ICD-10-CM | POA: Diagnosis not present

## 2018-08-14 DIAGNOSIS — J939 Pneumothorax, unspecified: Secondary | ICD-10-CM | POA: Diagnosis not present

## 2018-08-19 DIAGNOSIS — I10 Essential (primary) hypertension: Secondary | ICD-10-CM | POA: Diagnosis not present

## 2018-08-19 DIAGNOSIS — J189 Pneumonia, unspecified organism: Secondary | ICD-10-CM | POA: Diagnosis not present

## 2018-08-19 DIAGNOSIS — M059 Rheumatoid arthritis with rheumatoid factor, unspecified: Secondary | ICD-10-CM | POA: Diagnosis not present

## 2018-08-19 DIAGNOSIS — I5030 Unspecified diastolic (congestive) heart failure: Secondary | ICD-10-CM | POA: Diagnosis not present

## 2018-08-19 DIAGNOSIS — J939 Pneumothorax, unspecified: Secondary | ICD-10-CM | POA: Diagnosis not present

## 2018-08-19 DIAGNOSIS — F339 Major depressive disorder, recurrent, unspecified: Secondary | ICD-10-CM | POA: Diagnosis not present

## 2018-08-21 DIAGNOSIS — F339 Major depressive disorder, recurrent, unspecified: Secondary | ICD-10-CM | POA: Diagnosis not present

## 2018-08-21 DIAGNOSIS — J189 Pneumonia, unspecified organism: Secondary | ICD-10-CM | POA: Diagnosis not present

## 2018-08-21 DIAGNOSIS — J939 Pneumothorax, unspecified: Secondary | ICD-10-CM | POA: Diagnosis not present

## 2018-08-21 DIAGNOSIS — I5030 Unspecified diastolic (congestive) heart failure: Secondary | ICD-10-CM | POA: Diagnosis not present

## 2018-08-21 DIAGNOSIS — I10 Essential (primary) hypertension: Secondary | ICD-10-CM | POA: Diagnosis not present

## 2018-08-21 DIAGNOSIS — M059 Rheumatoid arthritis with rheumatoid factor, unspecified: Secondary | ICD-10-CM | POA: Diagnosis not present

## 2018-08-22 DIAGNOSIS — N3281 Overactive bladder: Secondary | ICD-10-CM | POA: Diagnosis not present

## 2018-08-22 DIAGNOSIS — J939 Pneumothorax, unspecified: Secondary | ICD-10-CM | POA: Diagnosis not present

## 2018-08-22 DIAGNOSIS — I10 Essential (primary) hypertension: Secondary | ICD-10-CM | POA: Diagnosis not present

## 2018-08-22 DIAGNOSIS — I5030 Unspecified diastolic (congestive) heart failure: Secondary | ICD-10-CM | POA: Diagnosis not present

## 2018-08-22 DIAGNOSIS — K219 Gastro-esophageal reflux disease without esophagitis: Secondary | ICD-10-CM | POA: Diagnosis not present

## 2018-08-22 DIAGNOSIS — E039 Hypothyroidism, unspecified: Secondary | ICD-10-CM | POA: Diagnosis not present

## 2018-08-22 DIAGNOSIS — M059 Rheumatoid arthritis with rheumatoid factor, unspecified: Secondary | ICD-10-CM | POA: Diagnosis not present

## 2018-08-22 DIAGNOSIS — J189 Pneumonia, unspecified organism: Secondary | ICD-10-CM | POA: Diagnosis not present

## 2018-08-22 DIAGNOSIS — F339 Major depressive disorder, recurrent, unspecified: Secondary | ICD-10-CM | POA: Diagnosis not present

## 2018-08-25 DIAGNOSIS — F339 Major depressive disorder, recurrent, unspecified: Secondary | ICD-10-CM | POA: Diagnosis not present

## 2018-08-25 DIAGNOSIS — J939 Pneumothorax, unspecified: Secondary | ICD-10-CM | POA: Diagnosis not present

## 2018-08-25 DIAGNOSIS — J189 Pneumonia, unspecified organism: Secondary | ICD-10-CM | POA: Diagnosis not present

## 2018-08-25 DIAGNOSIS — I10 Essential (primary) hypertension: Secondary | ICD-10-CM | POA: Diagnosis not present

## 2018-08-25 DIAGNOSIS — M059 Rheumatoid arthritis with rheumatoid factor, unspecified: Secondary | ICD-10-CM | POA: Diagnosis not present

## 2018-08-25 DIAGNOSIS — I5030 Unspecified diastolic (congestive) heart failure: Secondary | ICD-10-CM | POA: Diagnosis not present

## 2018-08-26 DIAGNOSIS — J939 Pneumothorax, unspecified: Secondary | ICD-10-CM | POA: Diagnosis not present

## 2018-08-26 DIAGNOSIS — J189 Pneumonia, unspecified organism: Secondary | ICD-10-CM | POA: Diagnosis not present

## 2018-08-26 DIAGNOSIS — F339 Major depressive disorder, recurrent, unspecified: Secondary | ICD-10-CM | POA: Diagnosis not present

## 2018-08-26 DIAGNOSIS — I5030 Unspecified diastolic (congestive) heart failure: Secondary | ICD-10-CM | POA: Diagnosis not present

## 2018-08-26 DIAGNOSIS — M059 Rheumatoid arthritis with rheumatoid factor, unspecified: Secondary | ICD-10-CM | POA: Diagnosis not present

## 2018-08-26 DIAGNOSIS — I10 Essential (primary) hypertension: Secondary | ICD-10-CM | POA: Diagnosis not present

## 2018-08-28 DIAGNOSIS — M059 Rheumatoid arthritis with rheumatoid factor, unspecified: Secondary | ICD-10-CM | POA: Diagnosis not present

## 2018-08-28 DIAGNOSIS — F339 Major depressive disorder, recurrent, unspecified: Secondary | ICD-10-CM | POA: Diagnosis not present

## 2018-08-28 DIAGNOSIS — J939 Pneumothorax, unspecified: Secondary | ICD-10-CM | POA: Diagnosis not present

## 2018-08-28 DIAGNOSIS — I10 Essential (primary) hypertension: Secondary | ICD-10-CM | POA: Diagnosis not present

## 2018-08-28 DIAGNOSIS — I5030 Unspecified diastolic (congestive) heart failure: Secondary | ICD-10-CM | POA: Diagnosis not present

## 2018-08-28 DIAGNOSIS — J189 Pneumonia, unspecified organism: Secondary | ICD-10-CM | POA: Diagnosis not present

## 2018-09-01 DIAGNOSIS — I5030 Unspecified diastolic (congestive) heart failure: Secondary | ICD-10-CM | POA: Diagnosis not present

## 2018-09-01 DIAGNOSIS — F339 Major depressive disorder, recurrent, unspecified: Secondary | ICD-10-CM | POA: Diagnosis not present

## 2018-09-01 DIAGNOSIS — J189 Pneumonia, unspecified organism: Secondary | ICD-10-CM | POA: Diagnosis not present

## 2018-09-01 DIAGNOSIS — M059 Rheumatoid arthritis with rheumatoid factor, unspecified: Secondary | ICD-10-CM | POA: Diagnosis not present

## 2018-09-01 DIAGNOSIS — J939 Pneumothorax, unspecified: Secondary | ICD-10-CM | POA: Diagnosis not present

## 2018-09-01 DIAGNOSIS — I10 Essential (primary) hypertension: Secondary | ICD-10-CM | POA: Diagnosis not present

## 2018-09-02 DIAGNOSIS — I10 Essential (primary) hypertension: Secondary | ICD-10-CM | POA: Diagnosis not present

## 2018-09-02 DIAGNOSIS — J939 Pneumothorax, unspecified: Secondary | ICD-10-CM | POA: Diagnosis not present

## 2018-09-02 DIAGNOSIS — J189 Pneumonia, unspecified organism: Secondary | ICD-10-CM | POA: Diagnosis not present

## 2018-09-02 DIAGNOSIS — M059 Rheumatoid arthritis with rheumatoid factor, unspecified: Secondary | ICD-10-CM | POA: Diagnosis not present

## 2018-09-02 DIAGNOSIS — I5030 Unspecified diastolic (congestive) heart failure: Secondary | ICD-10-CM | POA: Diagnosis not present

## 2018-09-02 DIAGNOSIS — F339 Major depressive disorder, recurrent, unspecified: Secondary | ICD-10-CM | POA: Diagnosis not present

## 2018-09-04 DIAGNOSIS — F339 Major depressive disorder, recurrent, unspecified: Secondary | ICD-10-CM | POA: Diagnosis not present

## 2018-09-04 DIAGNOSIS — J189 Pneumonia, unspecified organism: Secondary | ICD-10-CM | POA: Diagnosis not present

## 2018-09-04 DIAGNOSIS — I5030 Unspecified diastolic (congestive) heart failure: Secondary | ICD-10-CM | POA: Diagnosis not present

## 2018-09-04 DIAGNOSIS — I10 Essential (primary) hypertension: Secondary | ICD-10-CM | POA: Diagnosis not present

## 2018-09-04 DIAGNOSIS — J939 Pneumothorax, unspecified: Secondary | ICD-10-CM | POA: Diagnosis not present

## 2018-09-04 DIAGNOSIS — M059 Rheumatoid arthritis with rheumatoid factor, unspecified: Secondary | ICD-10-CM | POA: Diagnosis not present

## 2018-09-08 DIAGNOSIS — F339 Major depressive disorder, recurrent, unspecified: Secondary | ICD-10-CM | POA: Diagnosis not present

## 2018-09-08 DIAGNOSIS — J939 Pneumothorax, unspecified: Secondary | ICD-10-CM | POA: Diagnosis not present

## 2018-09-08 DIAGNOSIS — I10 Essential (primary) hypertension: Secondary | ICD-10-CM | POA: Diagnosis not present

## 2018-09-08 DIAGNOSIS — J189 Pneumonia, unspecified organism: Secondary | ICD-10-CM | POA: Diagnosis not present

## 2018-09-08 DIAGNOSIS — I5030 Unspecified diastolic (congestive) heart failure: Secondary | ICD-10-CM | POA: Diagnosis not present

## 2018-09-08 DIAGNOSIS — M059 Rheumatoid arthritis with rheumatoid factor, unspecified: Secondary | ICD-10-CM | POA: Diagnosis not present

## 2018-09-09 DIAGNOSIS — F339 Major depressive disorder, recurrent, unspecified: Secondary | ICD-10-CM | POA: Diagnosis not present

## 2018-09-09 DIAGNOSIS — M059 Rheumatoid arthritis with rheumatoid factor, unspecified: Secondary | ICD-10-CM | POA: Diagnosis not present

## 2018-09-09 DIAGNOSIS — I10 Essential (primary) hypertension: Secondary | ICD-10-CM | POA: Diagnosis not present

## 2018-09-09 DIAGNOSIS — J189 Pneumonia, unspecified organism: Secondary | ICD-10-CM | POA: Diagnosis not present

## 2018-09-09 DIAGNOSIS — I5030 Unspecified diastolic (congestive) heart failure: Secondary | ICD-10-CM | POA: Diagnosis not present

## 2018-09-09 DIAGNOSIS — J939 Pneumothorax, unspecified: Secondary | ICD-10-CM | POA: Diagnosis not present

## 2018-09-11 DIAGNOSIS — F339 Major depressive disorder, recurrent, unspecified: Secondary | ICD-10-CM | POA: Diagnosis not present

## 2018-09-11 DIAGNOSIS — M059 Rheumatoid arthritis with rheumatoid factor, unspecified: Secondary | ICD-10-CM | POA: Diagnosis not present

## 2018-09-11 DIAGNOSIS — I5030 Unspecified diastolic (congestive) heart failure: Secondary | ICD-10-CM | POA: Diagnosis not present

## 2018-09-11 DIAGNOSIS — J189 Pneumonia, unspecified organism: Secondary | ICD-10-CM | POA: Diagnosis not present

## 2018-09-11 DIAGNOSIS — I10 Essential (primary) hypertension: Secondary | ICD-10-CM | POA: Diagnosis not present

## 2018-09-11 DIAGNOSIS — J939 Pneumothorax, unspecified: Secondary | ICD-10-CM | POA: Diagnosis not present

## 2018-09-15 DIAGNOSIS — I5030 Unspecified diastolic (congestive) heart failure: Secondary | ICD-10-CM | POA: Diagnosis not present

## 2018-09-15 DIAGNOSIS — J189 Pneumonia, unspecified organism: Secondary | ICD-10-CM | POA: Diagnosis not present

## 2018-09-15 DIAGNOSIS — I10 Essential (primary) hypertension: Secondary | ICD-10-CM | POA: Diagnosis not present

## 2018-09-15 DIAGNOSIS — M059 Rheumatoid arthritis with rheumatoid factor, unspecified: Secondary | ICD-10-CM | POA: Diagnosis not present

## 2018-09-15 DIAGNOSIS — F339 Major depressive disorder, recurrent, unspecified: Secondary | ICD-10-CM | POA: Diagnosis not present

## 2018-09-15 DIAGNOSIS — J939 Pneumothorax, unspecified: Secondary | ICD-10-CM | POA: Diagnosis not present

## 2018-09-16 DIAGNOSIS — J939 Pneumothorax, unspecified: Secondary | ICD-10-CM | POA: Diagnosis not present

## 2018-09-16 DIAGNOSIS — J189 Pneumonia, unspecified organism: Secondary | ICD-10-CM | POA: Diagnosis not present

## 2018-09-16 DIAGNOSIS — I5030 Unspecified diastolic (congestive) heart failure: Secondary | ICD-10-CM | POA: Diagnosis not present

## 2018-09-16 DIAGNOSIS — M059 Rheumatoid arthritis with rheumatoid factor, unspecified: Secondary | ICD-10-CM | POA: Diagnosis not present

## 2018-09-16 DIAGNOSIS — F339 Major depressive disorder, recurrent, unspecified: Secondary | ICD-10-CM | POA: Diagnosis not present

## 2018-09-16 DIAGNOSIS — I10 Essential (primary) hypertension: Secondary | ICD-10-CM | POA: Diagnosis not present

## 2019-01-11 IMAGING — CT CT CERVICAL SPINE W/O CM
4 of 7 series · 14 of 33 positions shown, 16 images · non-contrast
Comparison: CT neck 02/01/2016.

CLINICAL DATA: Rib pain after a fall.

EXAM:
CT HEAD WITHOUT CONTRAST
CT CERVICAL SPINE WITHOUT CONTRAST
TECHNIQUE: Multidetector CT imaging of the head and cervical spine was
performed following the standard protocol without intravenous
contrast. Multiplanar CT image reconstructions of the cervical spine
were also generated.

[Series 4: bone windows · axial · 0.43mm/px · z∈[-102,-54]mm · 2 of 49 slices shown]
[im 17/49  bone]
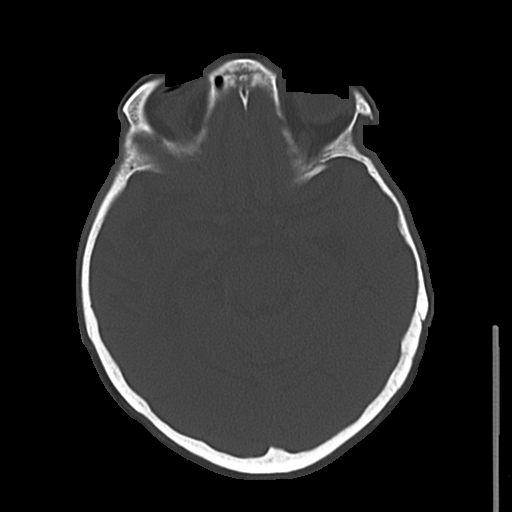
[im 33/49  bone]
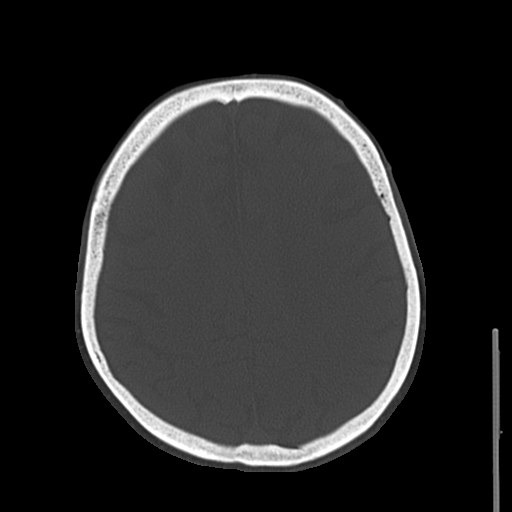

[Series 6: c-spine st · axial · 0.27mm/px · z∈[-266,-166]mm · 5 of 76 slices shown, 7 images]
[im 13/76  soft-tissue]
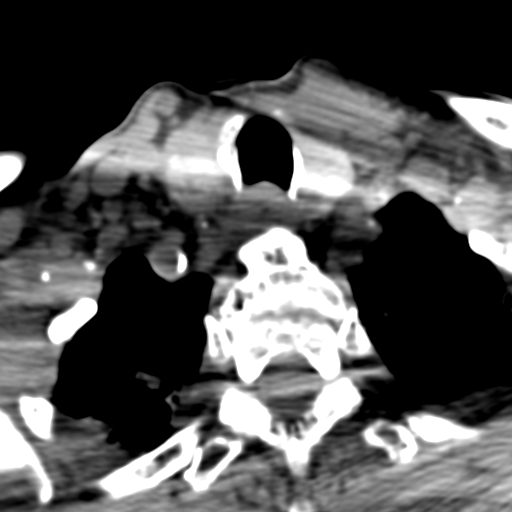
[im 13/76  bone]
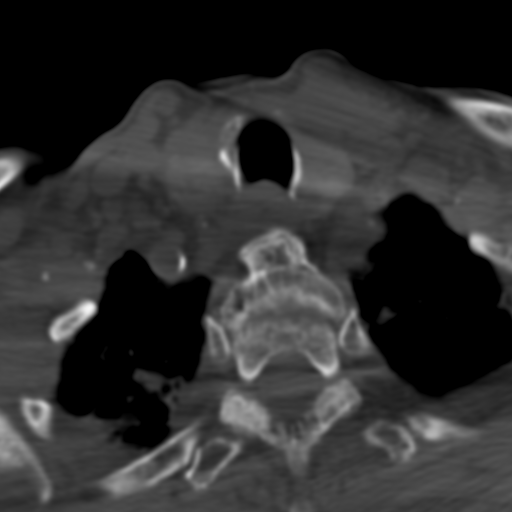
[im 26/76  bone]
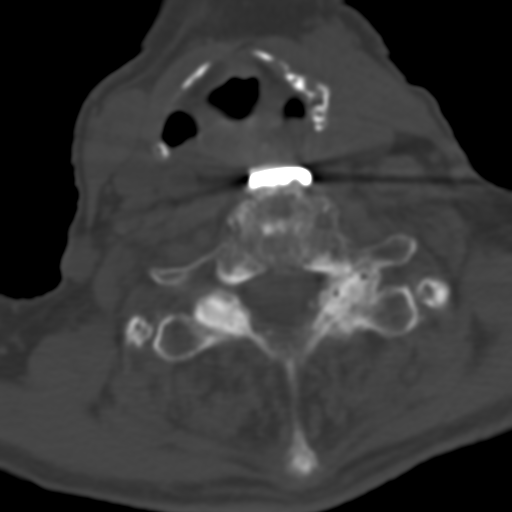
[im 38/76  bone]
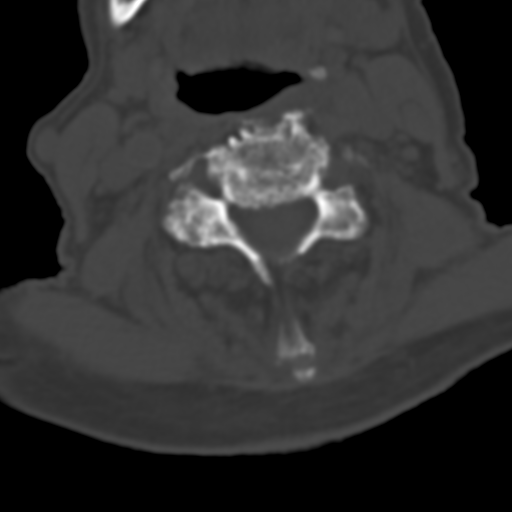
[im 51/76  bone]
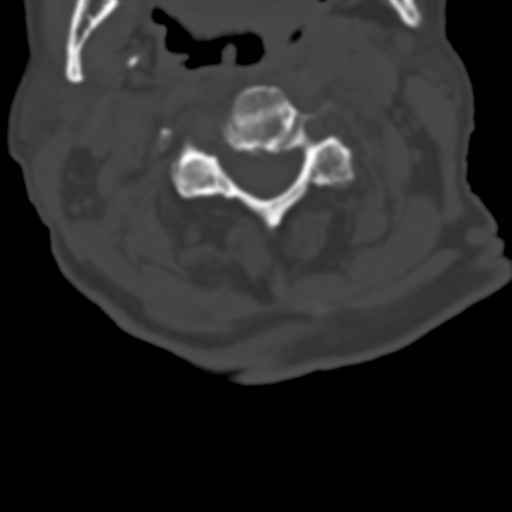
[im 63/76  soft-tissue]
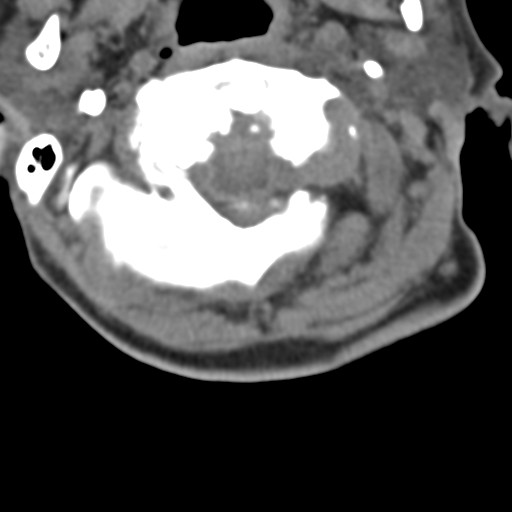
[im 63/76  bone]
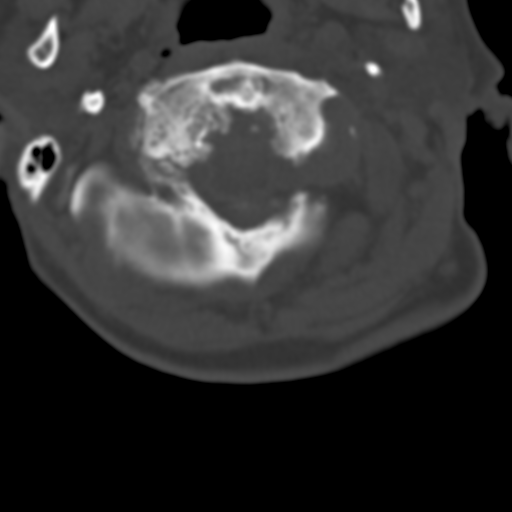

[Series 10: sagittal · sagittal · 0.31mm/px · 5 of 52 slices shown]
[im 8/52  bone]
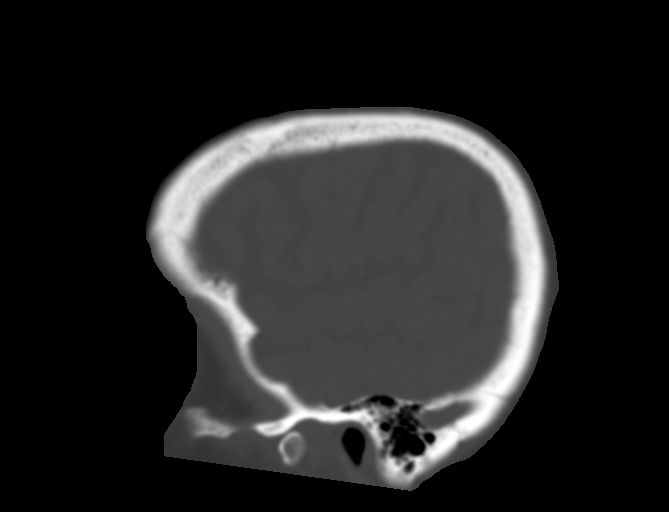
[im 15/52  bone]
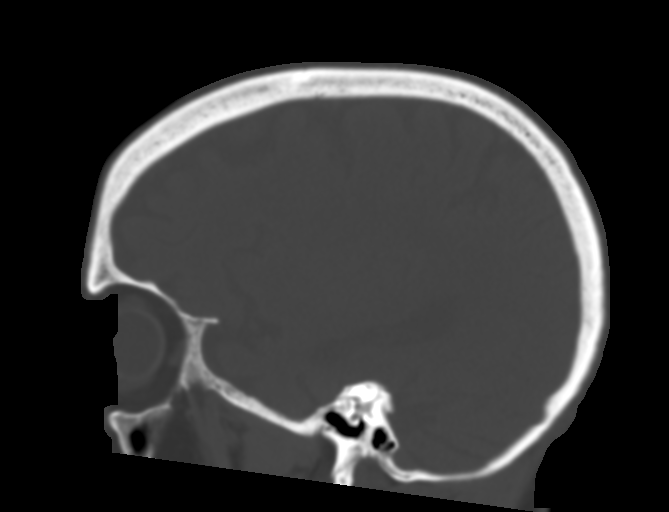
[im 22/52  bone]
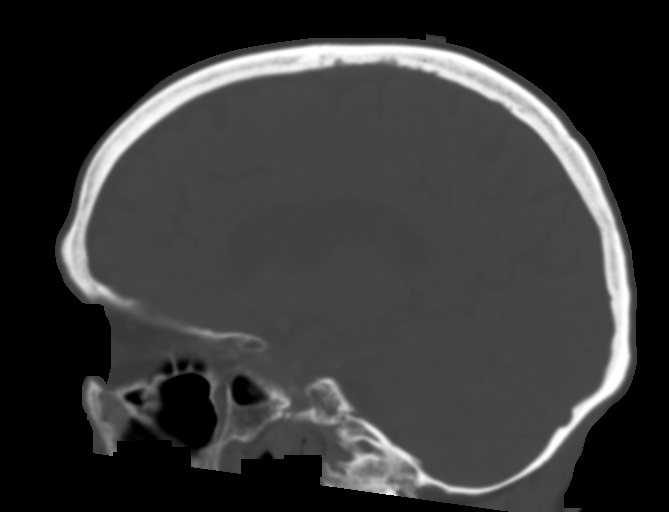
[im 30/52  bone]
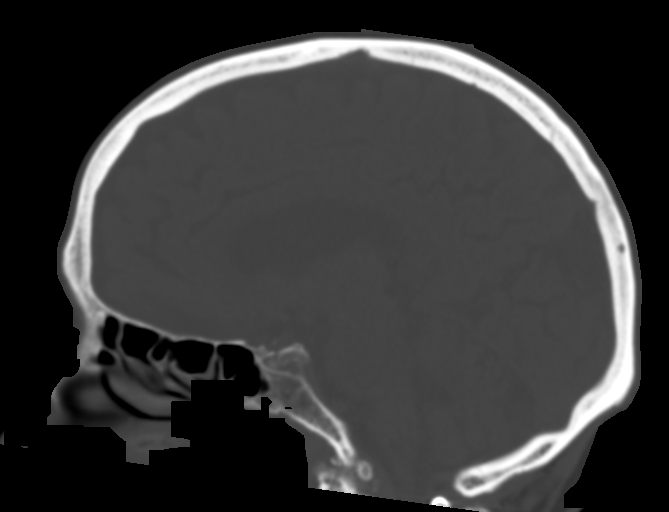
[im 37/52  bone]
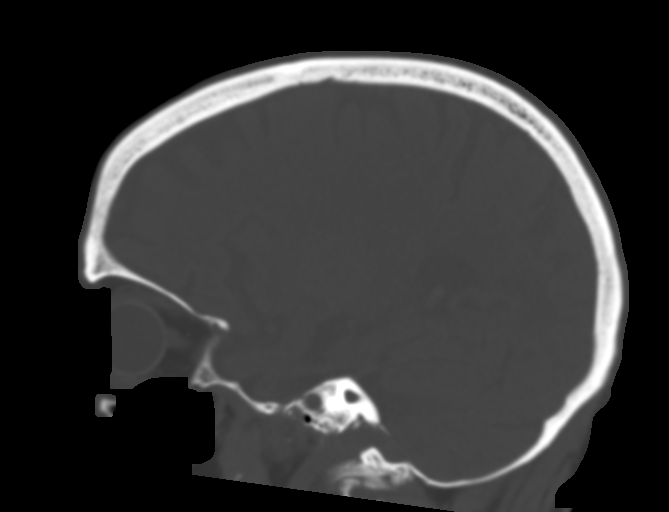

[Series 11: axial recon · coronal · 0.23mm/px · 2 of 95 slices shown]
[im 32/95  bone]
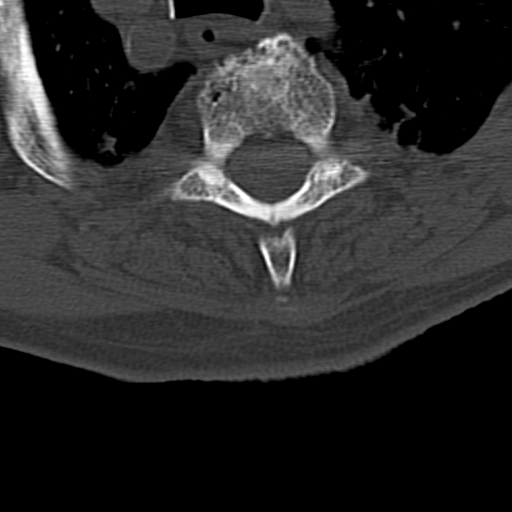
[im 63/95  bone]
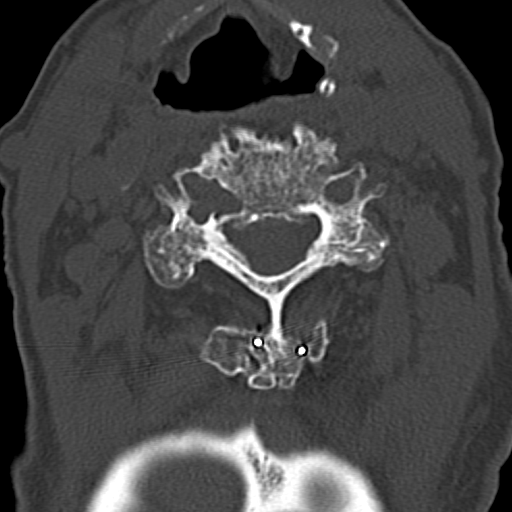

[14 of 33 positions shown; findings below may reference images not displayed]

FINDINGS: CT HEAD FINDINGS

Brain: No evidence for acute infarction, hemorrhage, mass lesion,
hydrocephalus, or extra-axial fluid. Global atrophy. Hypoattenuation
of white matter suggesting small vessel disease. Old LEFT occipital
infarct.

Vascular: No intracranial hyperdense vessel to suggest acute
thrombosis. BILATERAL cavernous carotid and distal vertebral
calcification, not unexpected for age.

Skull: No skull fracture.  No worrisome osseous lesion.

Sinuses/Orbits: BILATERAL cataract extraction. No layering sinus
fluid.

Other: None.

CT CERVICAL SPINE FINDINGS

Alignment: Degenerative anterolisthesis C3-C4. Chronic
anterolisthesis of the odontoid on body of CTA related to nonunion.
Previous posterior fusion.

Skull base and vertebrae: There is a chronic ununited type 2
odontoid fracture similar to 9224. Chronic C1 arch fractures are
also stable. Posterior cerclage wire at C1-C2 appears similar. There
has been previous C5-C7 ACDF with anterior plating without adverse
features. No acute fracture is seen.

Soft tissues and spinal canal: No prevertebral soft tissue swelling.
No intraspinal hematoma. Mild central canal stenosis may be present
C5-C6. Atherosclerosis. Nuchal ligament calcification.

Disc levels:  Multilevel spondylosis.

Upper chest: Biapical scarring. Tiny RIGHT pneumothorax. No rib
fracture can be seen.

Other: None.

Compared with 9224, the C1 and odontoid nonunions have a similar
appearance. No pneumothorax was present in 9224.
IMPRESSION: No skull fracture or intracranial hemorrhage.

Chronic type 2 odontoid fracture with nonunion. Chronic C1 fractures
also nonunion. Posterior cerclage wire fusion appears solid.

Tiny RIGHT apical pneumothorax. Biapical scarring appears similar.
If further investigation desired, recommend CT chest without
contrast.

Critical Value/emergent results were called by telephone at the time
of interpretation on 01/13/2017 at [DATE] to Dr. ABEL LUIS SANGUINETI
, who verbally acknowledged these results.

## 2019-01-11 IMAGING — CT CT CHEST W/ CM
2 of 3 series · 15 of 36 positions shown, 18 images · IV contrast (iopamidol)
Comparison: Chest radiograph earlier today. CT cervical spine
earlier today. CT abdomen and pelvis 06/03/2013.

CLINICAL DATA: Unwitnessed fall.  RIGHT-sided chest pain.

EXAM:
CT CHEST WITH CONTRAST
TECHNIQUE: Multidetector CT imaging of the chest was performed during
intravenous contrast administration.
CONTRAST:  1 0PQUKV-7JJ IOPAMIDOL (0PQUKV-7JJ) INJECTION 61%

[Series 2: chest with st · axial · 0.61mm/px · z∈[+1304,+1542]mm · 12 of 141 slices shown, 15 images]
[im 11/141  mediastinal]
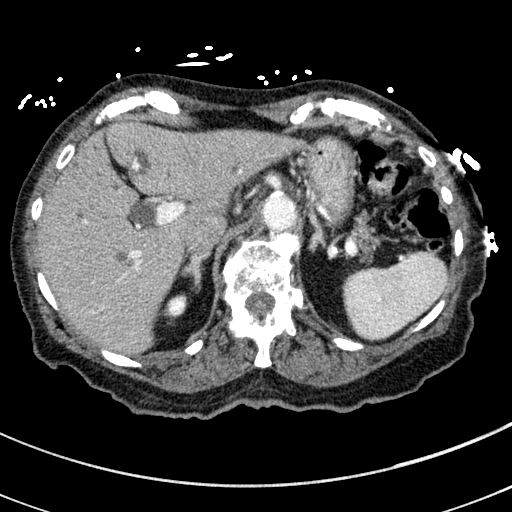
[im 11/141  lung]
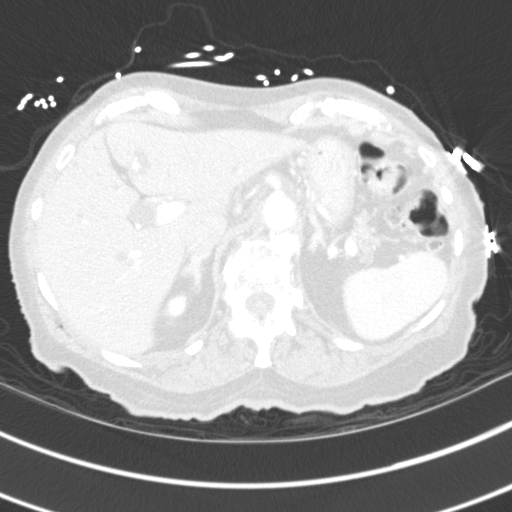
[im 21/141  lung]
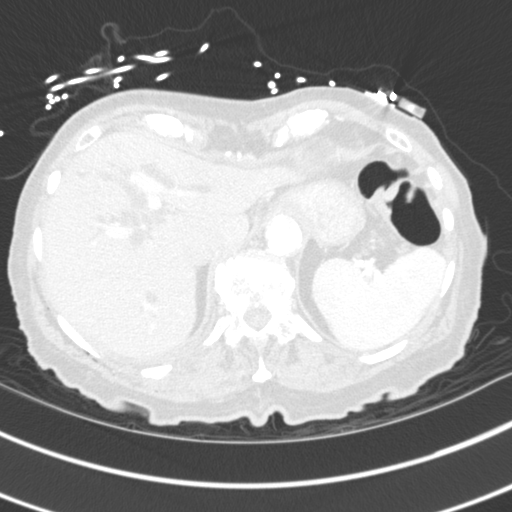
[im 32/141  lung]
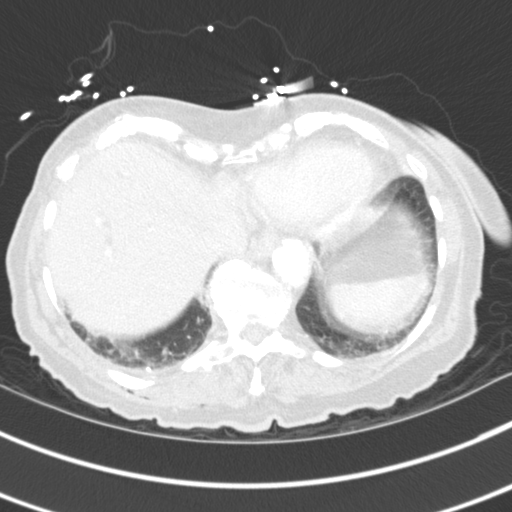
[im 42/141  lung]
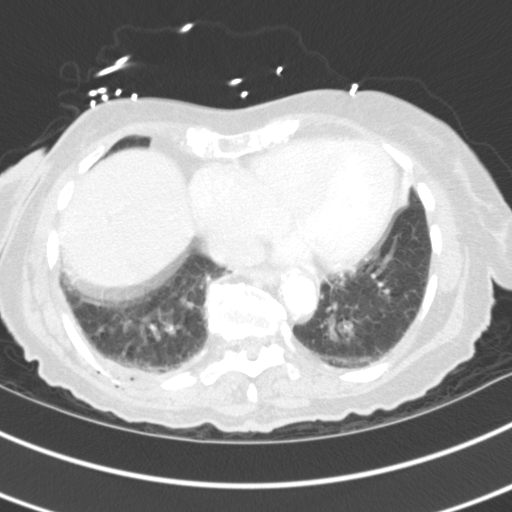
[im 52/141  mediastinal]
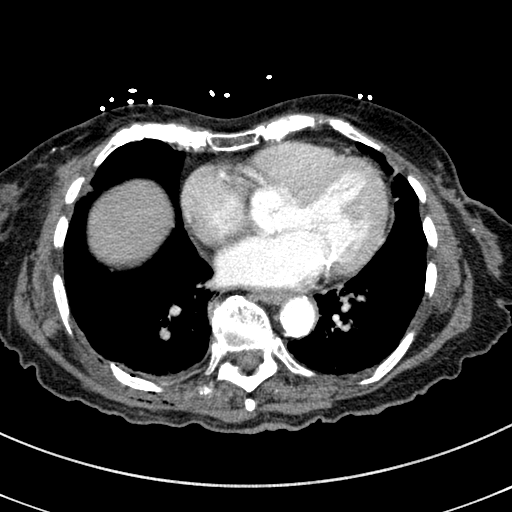
[im 52/141  lung]
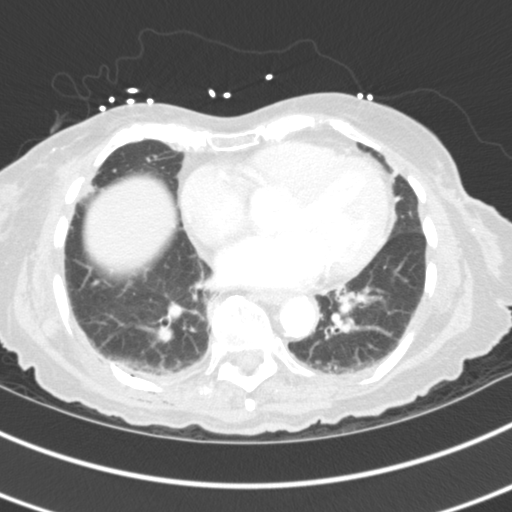
[im 63/141  lung]
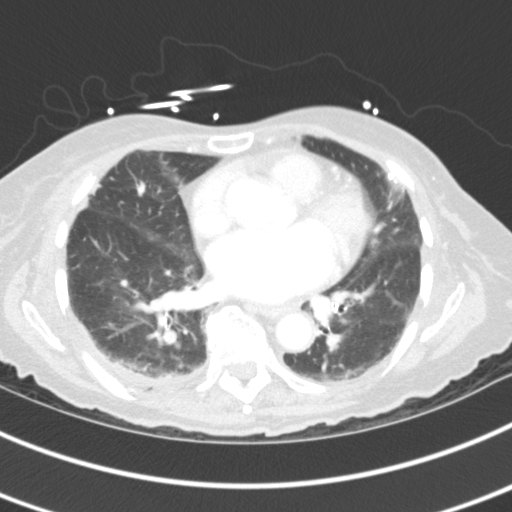
[im 73/141  lung]
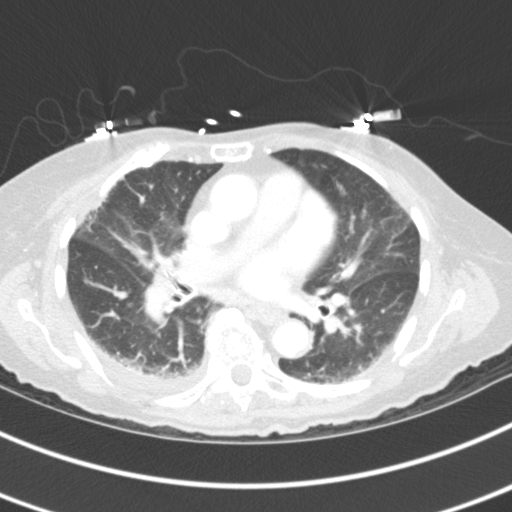
[im 83/141  lung]
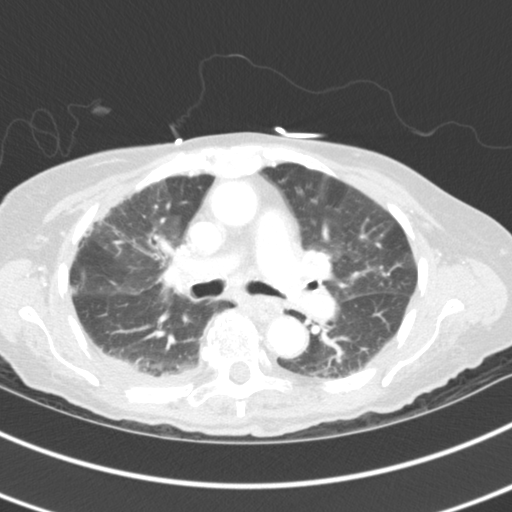
[im 94/141  mediastinal]
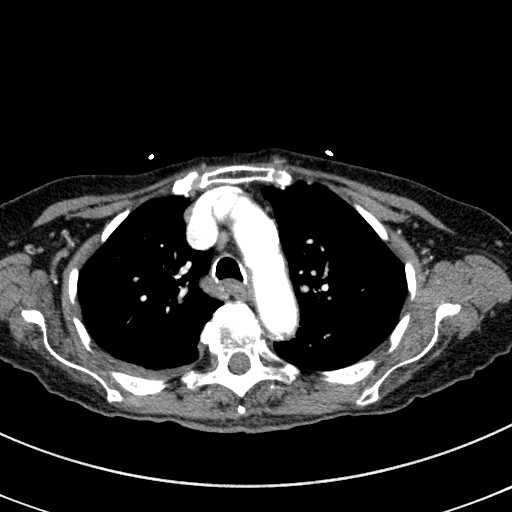
[im 94/141  lung]
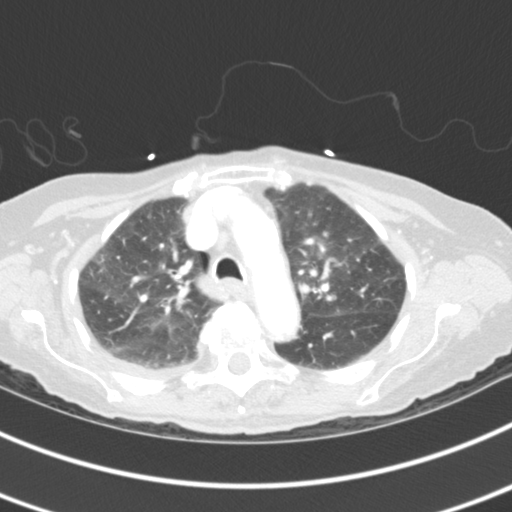
[im 104/141  lung]
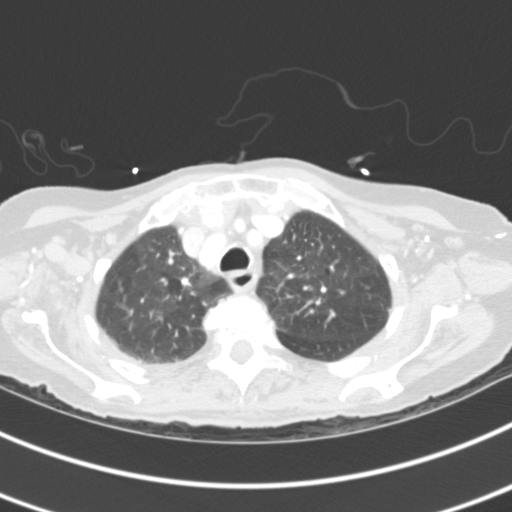
[im 115/141  lung]
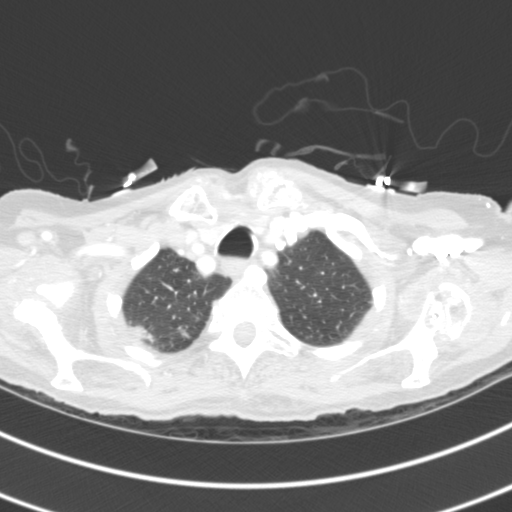
[im 130/141  lung]
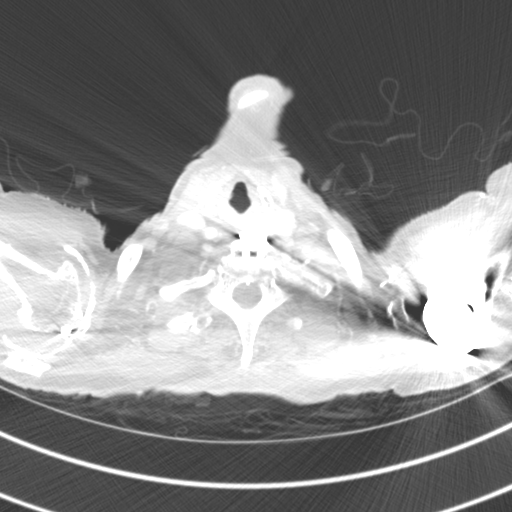

[Series 6: coronals · coronal · 0.59mm/px · 3 of 70 slices shown]
[im 14/70  lung]
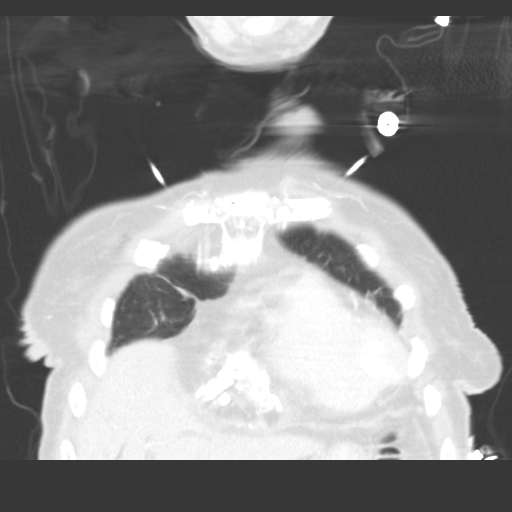
[im 28/70  lung]
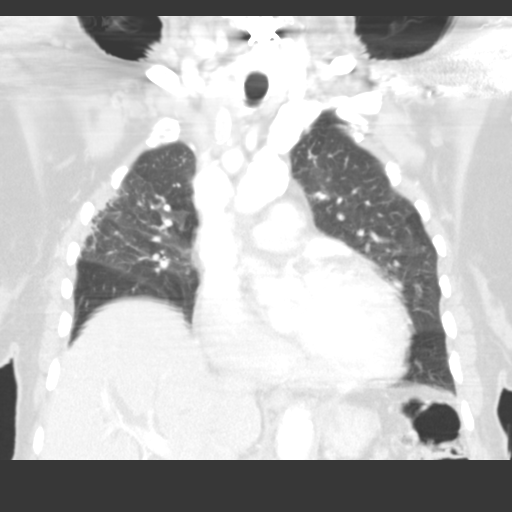
[im 42/70  lung]
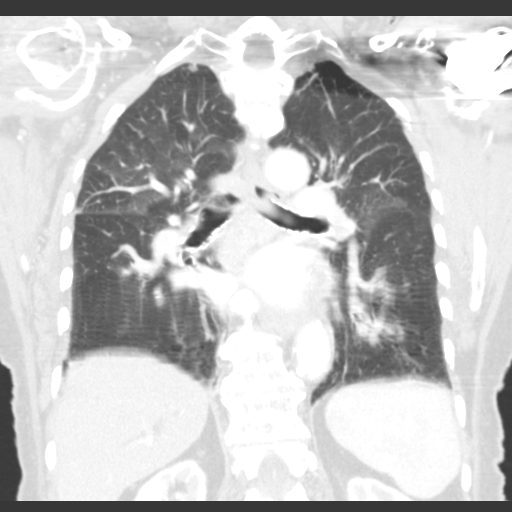

[15 of 36 positions shown; findings below may reference images not displayed]

FINDINGS: Cardiovascular: No significant vascular findings. Cardiomegaly. Mild
coronary artery calcification.

Mediastinum/Nodes: No hilar adenopathy. Pretracheal nodal
enlargement up to 12 mm, stable from priors. No paratracheal or AP
window adenopathy.

Lungs/Pleura: RIGHT pleural effusion/hemothorax. Small RIGHT apical
pneumothorax noted on CT cervical spine has resolved. There are tiny
bubbles of air in the RIGHT effusion and in the RIGHT posterior soft
tissues. Biapical pleural thickening. BILATERAL pleural plaques
similar to prior chest CT, likely post XRT findings.

Upper Abdomen: Post cholecystectomy appearance with biliary ductal
dilatation similar to prior CT.

Musculoskeletal: Displaced posterior RIGHT eighth rib fracture,
reference image 93 series 5. Nondisplaced posterolateral RIGHT
seventh rib fracture. Tiny nondisplaced RIGHT transverse process
fracture at T8.

Surgically replaced LEFT humeral head. Severe degenerative change
RIGHT shoulder.
IMPRESSION: There is a small RIGHT hemothorax related to displaced RIGHT eighth
rib fracture. This rib fracture cannot clearly be identified on
prior chest radiograph.

No significant pneumothorax. A small apical pneumothorax noted on CT
cervical spine and resolved. A few bubbles of air can be seen in the
RIGHT pleural fluid as well as in the soft tissues of the back.

Nondisplaced RIGHT seventh rib fracture and nondisplaced RIGHT T8
transverse process fracture.

## 2019-02-20 DEATH — deceased
